# Patient Record
Sex: Female | Born: 1935 | Race: White | Hispanic: No | Smoking: Never smoker
Health system: Southern US, Community
[De-identification: ages and names within clinical notes are randomized; demographics above are authoritative.]

## PROBLEM LIST (undated history)

## (undated) DIAGNOSIS — H3530 Unspecified macular degeneration: Secondary | ICD-10-CM

## (undated) DIAGNOSIS — T7840XA Allergy, unspecified, initial encounter: Secondary | ICD-10-CM

## (undated) DIAGNOSIS — E7800 Pure hypercholesterolemia, unspecified: Secondary | ICD-10-CM

## (undated) DIAGNOSIS — Z8619 Personal history of other infectious and parasitic diseases: Secondary | ICD-10-CM

## (undated) DIAGNOSIS — M1990 Unspecified osteoarthritis, unspecified site: Secondary | ICD-10-CM

## (undated) DIAGNOSIS — J189 Pneumonia, unspecified organism: Secondary | ICD-10-CM

## (undated) DIAGNOSIS — I10 Essential (primary) hypertension: Secondary | ICD-10-CM

## (undated) HISTORY — DX: Unspecified macular degeneration: H35.30

## (undated) HISTORY — DX: Essential (primary) hypertension: I10

## (undated) HISTORY — DX: Allergy, unspecified, initial encounter: T78.40XA

## (undated) HISTORY — DX: Pure hypercholesterolemia, unspecified: E78.00

## (undated) HISTORY — DX: Personal history of other infectious and parasitic diseases: Z86.19

## (undated) HISTORY — DX: Unspecified osteoarthritis, unspecified site: M19.90

## (undated) HISTORY — DX: Pneumonia, unspecified organism: J18.9

---
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
## 1997-04-25 ENCOUNTER — Ambulatory Visit (HOSPITAL_COMMUNITY): Admission: RE | Admit: 1997-04-25 | Discharge: 1997-04-25 | Payer: Self-pay | Admitting: Cardiology

## 1997-07-22 ENCOUNTER — Other Ambulatory Visit: Admission: RE | Admit: 1997-07-22 | Discharge: 1997-07-22 | Payer: Self-pay | Admitting: Cardiology

## 1999-01-20 ENCOUNTER — Ambulatory Visit (HOSPITAL_COMMUNITY): Admission: RE | Admit: 1999-01-20 | Discharge: 1999-01-20 | Payer: Self-pay | Admitting: Cardiology

## 2001-03-21 ENCOUNTER — Ambulatory Visit (HOSPITAL_COMMUNITY): Admission: RE | Admit: 2001-03-21 | Discharge: 2001-03-21 | Payer: Self-pay | Admitting: Cardiology

## 2001-04-16 ENCOUNTER — Emergency Department (HOSPITAL_COMMUNITY): Admission: EM | Admit: 2001-04-16 | Discharge: 2001-04-16 | Payer: Self-pay | Admitting: Emergency Medicine

## 2002-05-17 ENCOUNTER — Ambulatory Visit (HOSPITAL_COMMUNITY): Admission: RE | Admit: 2002-05-17 | Discharge: 2002-05-17 | Payer: Self-pay | Admitting: *Deleted

## 2003-11-07 ENCOUNTER — Encounter: Admission: RE | Admit: 2003-11-07 | Discharge: 2003-11-07 | Payer: Self-pay | Admitting: Orthopedic Surgery

## 2007-02-23 DIAGNOSIS — J189 Pneumonia, unspecified organism: Secondary | ICD-10-CM

## 2007-02-23 HISTORY — DX: Pneumonia, unspecified organism: J18.9

## 2007-08-15 ENCOUNTER — Inpatient Hospital Stay (HOSPITAL_COMMUNITY): Admission: EM | Admit: 2007-08-15 | Discharge: 2007-08-21 | Payer: Self-pay | Admitting: Emergency Medicine

## 2008-06-13 ENCOUNTER — Encounter: Payer: Self-pay | Admitting: Family Medicine

## 2008-10-17 ENCOUNTER — Encounter: Payer: Self-pay | Admitting: Family Medicine

## 2008-10-23 ENCOUNTER — Encounter: Payer: Self-pay | Admitting: Family Medicine

## 2008-10-24 ENCOUNTER — Ambulatory Visit: Payer: Self-pay | Admitting: Family Medicine

## 2008-10-24 DIAGNOSIS — I10 Essential (primary) hypertension: Secondary | ICD-10-CM

## 2008-10-24 DIAGNOSIS — J309 Allergic rhinitis, unspecified: Secondary | ICD-10-CM | POA: Insufficient documentation

## 2008-10-24 DIAGNOSIS — M1710 Unilateral primary osteoarthritis, unspecified knee: Secondary | ICD-10-CM

## 2008-10-24 DIAGNOSIS — E785 Hyperlipidemia, unspecified: Secondary | ICD-10-CM

## 2009-06-30 ENCOUNTER — Encounter: Payer: Self-pay | Admitting: Family Medicine

## 2009-07-02 ENCOUNTER — Encounter: Payer: Self-pay | Admitting: Family Medicine

## 2009-11-11 ENCOUNTER — Encounter (INDEPENDENT_AMBULATORY_CARE_PROVIDER_SITE_OTHER): Payer: Self-pay | Admitting: *Deleted

## 2009-11-19 ENCOUNTER — Ambulatory Visit: Payer: Self-pay | Admitting: Cardiology

## 2010-01-19 ENCOUNTER — Ambulatory Visit: Payer: Self-pay | Admitting: Family Medicine

## 2010-02-25 ENCOUNTER — Ambulatory Visit
Admission: RE | Admit: 2010-02-25 | Discharge: 2010-02-25 | Payer: Self-pay | Source: Home / Self Care | Attending: Family Medicine | Admitting: Family Medicine

## 2010-03-16 ENCOUNTER — Other Ambulatory Visit: Payer: Self-pay | Admitting: Family Medicine

## 2010-03-16 ENCOUNTER — Ambulatory Visit
Admission: RE | Admit: 2010-03-16 | Discharge: 2010-03-16 | Payer: Self-pay | Source: Home / Self Care | Attending: Family Medicine | Admitting: Family Medicine

## 2010-03-18 ENCOUNTER — Encounter (INDEPENDENT_AMBULATORY_CARE_PROVIDER_SITE_OTHER): Payer: Self-pay | Admitting: *Deleted

## 2010-03-18 ENCOUNTER — Encounter: Payer: Self-pay | Admitting: Family Medicine

## 2010-03-26 NOTE — Letter (Signed)
Summary: Results Follow up Letter  [place] at [facility]  [address] [place], [place] [address]   Phone: [phone]  Fax: [phone]    03/18/2010 MRN: [id number]  Cindy Harvey [address] [place], [place]  [address]  Dear Ms. Cindy Harvey,  The following are the results of your recent test(s):  Test         Result    Pap Smear:        Normal _____  Not Normal _____ Comments: ______________________________________________________ Cholesterol: LDL(Bad cholesterol):         Your goal is less than:         HDL (Good cholesterol):       Your goal is more than: Comments:  ______________________________________________________ Mammogram:        Normal _____  Not Normal _____ Comments:  ___________________________________________________________________ Hemoccult:        Normal __X___  Not normal _______ Comments:  Repeat in 1 year  _____________________________________________________________________ Other Tests:    We routinely do not discuss normal results over the telephone.  If you desire a copy of the results, or you have any questions about this information we can discuss them at your next office visit.   Sincerely,       Sharilyn Sites for Dr. Roxy Manns

## 2010-03-26 NOTE — Miscellaneous (Signed)
Summary: iFOB to flowsheet  Clinical Lists Changes  Observations: Added new observation of HEMOCULTDUE: 03/2011 (03/18/2010 11:44) Added new observation of HEMOCCULT: negative (03/16/2010 11:44)      Preventive Care Screening  Hemoccult:    Date:  03/16/2010    Next Due:  03/2011    Results:  negative

## 2010-03-26 NOTE — Letter (Signed)
Summary: Results Follow up Letter  [place] at [facility]  [address] [place], [place] [address]   Phone: [phone]  Fax: [phone]    07/02/2009 MRN: [id number]    IDA Cayer [address] [place], [place]  [address]    Dear Ms. Janae Bridgeman,  The following are the results of your recent test(s):  Test         Result    Pap Smear:        Normal _____  Not Normal _____ Comments: ______________________________________________________ Cholesterol: LDL(Bad cholesterol):         Your goal is less than:         HDL (Good cholesterol):       Your goal is more than: Comments:  ______________________________________________________ Mammogram:        Normal _X____  Not Normal _____ Comments:Please repeat mammogram in one year.  ___________________________________________________________________ Hemoccult:        Normal _____  Not normal _______ Comments:    _____________________________________________________________________ Other Tests:    We routinely do not discuss normal results over the telephone.  If you desire a copy of the results, or you have any questions about this information we can discuss them at your next office visit.   Sincerely,    Idamae Schuller Ethan Clayburn,MD  MT/ri

## 2010-03-26 NOTE — Miscellaneous (Signed)
Summary: flu vaccine at [facility]  Clinical Lists Changes  Observations: Added new observation of FLU VAX: Historical (11/10/2009 14:41)      Immunization History:  Influenza Immunization History:    Influenza:  historical (11/10/2009)  Received flu vaccine at [facility] [address], [place].             Lowella Petties CMA  November 11, 2009 2:42 PM

## 2010-03-26 NOTE — Assessment & Plan Note (Signed)
Summary: check up - chronic care   Vital Signs:  Patient profile:   77 year old female Height:      60 inches Weight:      137 pounds BMI:     26.85 Temp:     97.6 degrees F oral Pulse rate:   76 / minute Pulse rhythm:   regular BP sitting:   126 / 64  (left arm) Cuff size:   regular  Vitals Entered By: Lewanda Rife LPN (January 19, 2010 2:35 PM) CC: check up chronic med problems  Vision Screening:      Vision Comments: Pt just saw eye dr. Rock Nephew could not see chart.Lewanda Rife LPN  January 19, 2010 2:37 PM   Vision Entered By: Lewanda Rife LPN (January 19, 2010 2:37 PM)  Hearing Screen 25db HL: Left  Right  Audiometry Comment: Pt has hearing aids and gets hearing cked with audiologist.Rena [facility] LPN  January 19, 2010 2:37 PM     History of Present Illness: here for check up of chronic med problems and to review health mt list   HTN-- bp in good control with 126/64 today  bmi is 26 with stable wt   has been feeling ok in general  her family thinks she was a bit depressed  had med in the past short term  is a Product/process development scientist (has 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 that she worries about ) sometimes she cries  does wake up every night and then stays up late  she claims it is not bad enough to treat  does not like to do a whole lot   stays fairly active   per family -- does not eat a healthy diet  a little ensure and vitamins  eats a lot of "junk" -- cookies  chooses not to eat meals   gets regular lipid checks from cardiologist  she goes every 4 months to Dr Sampson Si 5/11 self exam - no lumps or changes (though does not do breast exams regularly) Dr Patty Sermons checks her breast exam  does not want a breast exam today- she declines   gyn -- last pap and exam years ago  no problems or symptoms   Td2010 flu shot in sept pneumonia vax 09  zoster status- has had the shingles vaccine  unsure when she had the vaccine   colon cancer screen-- does not want to do colonoscopy but open to  doing a stool card     OP screen-- has never had a dexa  ca and D  macular degeneration- sees opthy regularly she gets down about her dwindling sight at times  is not severe - but can be frustrating   has hearing aides   Allergies (verified): No Known Drug Allergies  Past History:  Past Medical History: Last updated: 10/24/2008 osteo arthritis (R knee) Allergic rhinitis Hyperlipidemia (no heart problems )  Hypertension Urinary Incontinence macular degeneration hearing loss  thinks she has had shingles - years ago      cardiol Dr Patty Sermons ortho-Dr Giofree pod- Dr Lorelee New Dr Felicity Pellegrini  Past Surgical History: Last updated: 10/24/2008 Tonsillectomy (1928) Childbirth (1946) Pnemonia, dehydration, electrolyre imbalance (2009)  Family History: Last updated: 10/24/2008 Family History of  Stroke( Mother, Sister) Family History of Heart Disease (Mother) Family History Breast cancer (Maternal Aunt, 2 Maternal 1st cousins) mother - heart disease  sister CVa in 90s  sister died of pneumonia /? heart  sister CVA - in 90s  sister alive in 38s  Social History: Last updated: 10/24/2008 Retired widowed  lives in [place] with son  family close by son is lawyer/ daughter nurse  walks regularly (lives next door to [employer])  Never Smoked Alcohol use-no Drug use-no  Risk Factors: Smoking Status: never (10/24/2008)  Review of Systems General:  Complains of sleep disorder; denies fatigue and malaise. Eyes:  Denies eye irritation. CV:  Denies chest pain or discomfort and palpitations. Resp:  Denies cough, shortness of breath, and wheezing. GI:  Denies abdominal pain, change in bowel habits, indigestion, and nausea. GU:  Denies urinary frequency. MS:  Complains of joint pain and stiffness; denies joint redness, joint swelling, muscle aches, cramps, and muscle weakness. Derm:  Denies itching, lesion(s), poor wound healing, and rash. Neuro:  Denies  numbness and tingling. Psych:  Complains of anxiety and depression; denies panic attacks, sense of great danger, and suicidal thoughts/plans. Endo:  Denies cold intolerance, excessive thirst, excessive urination, and heat intolerance. Heme:  Denies abnormal bruising and bleeding.  Physical Exam  General:  well appearing elderly female in no distress Head:  normocephalic, atraumatic, and no abnormalities observed.   Eyes:  vision grossly intact, pupils equal, pupils round, and pupils reactive to light.  no conjunctival pallor, injection or icterus  Ears:  R ear normal and L ear normal.   Nose:  no nasal discharge.   Mouth:  pharynx pink and moist.   Neck:  supple with full rom and no masses or thyromegally, no JVD or carotid bruit  Chest Wall:  No deformities, masses, or tenderness noted. Lungs:  CTA - bs are harsh at bases without wheeze  Heart:  RRR soft M vs split S1  Abdomen:  Bowel sounds positive,abdomen soft and non-tender without masses, organomegaly or hernias noted. no renal bruits  Msk:  No deformity or scoliosis noted of thoracic or lumbar spine.  some changes of OA  Pulses:  R and L carotid,radial,femoral,dorsalis pedis and posterior tibial pulses are full and equal bilaterally Extremities:  No clubbing, cyanosis, edema, or deformity noted with normal full range of motion of all joints.   Neurologic:  sensation intact to light touch, gait normal, and DTRs symmetrical and normal.   Skin:  Intact without suspicious lesions or rashes Cervical Nodes:  No lymphadenopathy noted Inguinal Nodes:  No significant adenopathy Psych:  normal affect, talkative and pleasant  does occ repeat herself and very tangential difficult to guide in conversation poor historian--family member was helpful   Impression & Recommendations:  Problem # 1:  HYPERTENSION (ICD-401.9) Assessment Unchanged  overall good control  meds per her cardiologist --- will ask for labs at next draw there no  change  Her updated medication list for this problem includes:    Lisinopril 20 Mg Tabs (Lisinopril) .Marland Kitchen... Take 1 tablet by mouth once a day    Amlodipine Besylate 5 Mg Tabs (Amlodipine besylate) .Marland Kitchen... Take 1 tablet by mouth once a day  BP today: 126/64 Prior BP: 110/68 (10/24/2008)  Problem # 2:  HYPERLIPIDEMIA (ICD-272.4) in good control with cardiol monitor  no side eff will ask for labs at next draw there  disc low sat fat diet and pt is not motivated to work on that  explained in detail why this is important  Her updated medication list for this problem includes:    Zetia 10 Mg Tabs (Ezetimibe) .Marland Kitchen... Take 1 tablet by mouth once a day    Niaspan 500 Mg Cr-tabs (Niacin (antihyperlipidemic)) .Marland Kitchen... 2 tabs by mouth at bedtime  Problem # 3:  FAMILY HISTORY BREAST CANCER 1ST DEGREE RELATIVE <50 (ICD-V16.3) pt declines breast exam  enc to do her own  wants to continue mam- due in [place]  Problem # 4:  Preventive Health Care (ICD-V70.0) Assessment: Comment Only pt is interested in dexa this year- will call back to schedule disc imp of bone health and prev of fractures  rev ca and vit D req also will do stool immunoassay card  Complete Medication List: 1)  Zetia 10 Mg Tabs (Ezetimibe) .... Take 1 tablet by mouth once a day 2)  Lisinopril 20 Mg Tabs (Lisinopril) .... Take 1 tablet by mouth once a day 3)  Amlodipine Besylate 5 Mg Tabs (Amlodipine besylate) .... Take 1 tablet by mouth once a day 4)  Niaspan 500 Mg Cr-tabs (Niacin (antihyperlipidemic)) .... 2 tabs by mouth at bedtime 5)  Ocuvite Tabs (Multiple vitamins-minerals) .... Take 1 tablet by mouth once a day 6)  [place]  .Marland Kitchen.. 1 tbsp by mouth daily 7)  Calcium-vitamin D 500-125 Mg-unit Tabs (Calcium-vitamin d) .... Two rabs by mouth daily 8)  Childrens Multivitamin + Mineral Supplement _ Dha  .Marland Kitchen.. 3 tabs by mouth daily 9)  Benadryl Allergy Childrens 12.5 Mg/77ml Liqd (Diphenhydramine hcl) .... By mouth as needed 10)   Tylenol Extra Strength 500 Mg Tabs (Acetaminophen) .... By mouth as needed 11)  Mucinex For Kids 100 Mg Pack (Guaifenesin) .... By mouth as needed 12)  Fexofenadine Hcl 60 Mg Tabs (Fexofenadine hcl) .... By mouth as needed  Patient Instructions: 1)  try to eat a healthier diet -- get at least 5 fruits and vegetables per day  2)  don't forget to do your own breast exams  3)  the current recommendation for calcium intake is 1200-1500 mg daily with -1000 IU of vitamin D  4)  I recommend a bone density test -- to screen for osteoporosis  5)  call us when you are ready to schedule that 6)  please do stool card for colon cancer screening  7)  let me know in the future the date of your shingles vaccine  8)  when you get your next labs from Dr Patty Sermons - I am interested in renal profile/ hepatic profile/ lipids/ cbc with diff/ tsh / lipids  9)  if your depression worsens- please come in to talk to me about it    Orders Added: 1)  Est. Patient Level IV [60454]     Current Allergies (reviewed today): No known allergies

## 2010-03-26 NOTE — Letter (Signed)
Summary: [facility]: Immunoassay Fecal Occult Blood (iFOB) Order Form  [place] at [facility]  [address] [place], [place] [address]   Phone: [phone]  Fax: [phone]      [facility]: Immunoassay Fecal Occult Blood (iFOB) Order Form   January 19, 2010 MRN: [id number]   Cindy Harvey 22-Jun-1916   Physicican Name:____Tower_____________________  Diagnosis Code:________V76.3 colon cancer screen__________________      Judith Part MD

## 2010-03-26 NOTE — Miscellaneous (Signed)
Summary: mammo screening  Clinical Lists Changes  Observations: Added new observation of MAMMO DUE: 06/2010 (07/02/2009 12:22) Added new observation of MAMMOGRAM: normal (06/30/2009 12:22)      Preventive Care Screening  Mammogram:    Date:  06/30/2009    Next Due:  06/2010    Results:  normal

## 2010-03-28 ENCOUNTER — Encounter: Payer: Self-pay | Admitting: Cardiology

## 2010-03-28 DIAGNOSIS — E7800 Pure hypercholesterolemia, unspecified: Secondary | ICD-10-CM | POA: Insufficient documentation

## 2010-03-28 DIAGNOSIS — I10 Essential (primary) hypertension: Secondary | ICD-10-CM | POA: Insufficient documentation

## 2010-05-20 ENCOUNTER — Telehealth: Payer: Self-pay | Admitting: Cardiology

## 2010-05-20 NOTE — Telephone Encounter (Signed)
Patient requesting samples 

## 2010-05-21 ENCOUNTER — Encounter: Payer: Self-pay | Admitting: Cardiology

## 2010-05-21 ENCOUNTER — Ambulatory Visit (INDEPENDENT_AMBULATORY_CARE_PROVIDER_SITE_OTHER): Payer: Medicare Other | Admitting: Cardiology

## 2010-05-21 VITALS — BP 136/78 | HR 80 | Wt 132.0 lb

## 2010-05-21 DIAGNOSIS — F32A Depression, unspecified: Secondary | ICD-10-CM | POA: Insufficient documentation

## 2010-05-21 DIAGNOSIS — I119 Hypertensive heart disease without heart failure: Secondary | ICD-10-CM

## 2010-05-21 DIAGNOSIS — F329 Major depressive disorder, single episode, unspecified: Secondary | ICD-10-CM

## 2010-05-21 DIAGNOSIS — E785 Hyperlipidemia, unspecified: Secondary | ICD-10-CM

## 2010-05-21 DIAGNOSIS — I10 Essential (primary) hypertension: Secondary | ICD-10-CM

## 2010-05-21 DIAGNOSIS — Z79899 Other long term (current) drug therapy: Secondary | ICD-10-CM

## 2010-05-21 LAB — COMPREHENSIVE METABOLIC PANEL
AST: 23 U/L (ref 0–37)
Albumin: 4.3 g/dL (ref 3.5–5.2)
Alkaline Phosphatase: 64 U/L (ref 39–117)
Glucose, Bld: 88 mg/dL (ref 70–99)
Potassium: 4.2 mEq/L (ref 3.5–5.1)
Sodium: 137 mEq/L (ref 135–145)
Total Bilirubin: 0.3 mg/dL (ref 0.3–1.2)
Total Protein: 6.9 g/dL (ref 6.0–8.3)

## 2010-05-21 LAB — LDL CHOLESTEROL, DIRECT: Direct LDL: 111.7 mg/dL

## 2010-05-21 LAB — CBC WITH DIFFERENTIAL/PLATELET
Basophils Relative: 0.4 % (ref 0.0–3.0)
Eosinophils Absolute: 0.1 10*3/uL (ref 0.0–0.7)
Eosinophils Relative: 1.6 % (ref 0.0–5.0)
HCT: 35.3 % — ABNORMAL LOW (ref 36.0–46.0)
Lymphs Abs: 2.3 10*3/uL (ref 0.7–4.0)
MCHC: 33.1 g/dL (ref 30.0–36.0)
MCV: 94.2 fl (ref 78.0–100.0)
Monocytes Absolute: 0.7 10*3/uL (ref 0.1–1.0)
Neutrophils Relative %: 59.8 % (ref 43.0–77.0)
Platelets: 288 10*3/uL (ref 150.0–400.0)
RBC: 3.75 Mil/uL — ABNORMAL LOW (ref 3.87–5.11)
WBC: 7.9 10*3/uL (ref 4.5–10.5)

## 2010-05-21 LAB — LIPID PANEL
HDL: 78.3 mg/dL (ref >39.00)
Triglycerides: 105 mg/dL (ref 0.0–149.0)

## 2010-05-21 NOTE — Assessment & Plan Note (Signed)
The patient is to stay on her present dose of Zetia.  She does not tolerate statins which caused her to have muscle aches.  We are checking a fasting lipid panel today.

## 2010-05-21 NOTE — Assessment & Plan Note (Signed)
Her blood pressure remained stable on current regimen.  Continue lisinopril 20 mg daily and amlodipine 5 mg daily.  She is not having any cough from lisinopril.

## 2010-05-21 NOTE — Progress Notes (Signed)
HPI: This pleasant 77 year old woman is seen for a six-month followup office visit.  She has a history of essential hypertension and a history of high cholesterol.  She does not give any history ofExertional chest pain or angina pectoris.  She has had some problems with anxiety and depression.  However she has refused any treatment for her depression because she is concerned that it might make her too drowsy at night when she gets up to go to the bathroom.  Current Outpatient Prescriptions  Medication Sig Dispense Refill  . ALPRAZolam (XANAX) 0.25 MG tablet Take 0.25 mg by mouth 2 (two) times daily as needed.        Marland Kitchen amLODipine (NORVASC) 5 MG tablet Take 5 mg by mouth daily.        . Calcium Carbonate-Vitamin D (CALCIUM 600+D) 600-200 MG-UNIT TABS Take by mouth daily.        Marland Kitchen ezetimibe (ZETIA) 10 MG tablet Take 10 mg by mouth daily.        Marland Kitchen FEXOFENADINE HCL PO Take by mouth daily as needed.        Marland Kitchen guaiFENesin (MUCINEX) 600 MG 12 hr tablet Take 600 mg by mouth as needed.        Marland Kitchen lisinopril (PRINIVIL,ZESTRIL) 20 MG tablet Take 20 mg by mouth daily.        . Multiple Vitamins-Minerals (OCUVITE PO) Take by mouth daily.        . multivitamin (THERAGRAN) per tablet Take 1 tablet by mouth daily.        . niacin (NIASPAN) 500 MG CR tablet Take 500 mg by mouth at bedtime. 2 at bedtime        . Nutritional Supplements (ENSURE PO) Take by mouth at bedtime.        . promethazine (PHENERGAN) 25 MG tablet Take 25 mg by mouth as needed. For nausea        . Pseudoephedrine HCl (SUDAFED PO) Take by mouth daily as needed.          Allergies  Allergen Reactions  . Asa Buff (Mag (Aspirin Buffered)     Nose bleeds  . Lipitor (Atorvastatin Calcium)     Headaches & dizzy  . Zocor (Simvastatin - High Dose)     myalgia    Patient Active Problem List  Diagnoses  . HYPERLIPIDEMIA  . HYPERTENSION  . ALLERGIC RHINITIS  . OSTEOARTHRITIS, KNEE  . Depression (emotion)    History  Smoking status  .  Never Smoker   Smokeless tobacco  . Not on file    History  Alcohol Use     History reviewed. No pertinent family history.  Review of Systems: The patient denies any heat or cold intolerance.  No weight gain or weight loss.  The patient denies headaches or blurry vision.  There is no cough or sputum production.  The patient denies dizziness.  There is no hematuria or hematochezia.  The patient denies any muscle aches or arthritis.  The patient denies any rash.  The patient denies frequent falling or instability.  .  All other systems were reviewed and are negative.   Physical Exam: Filed Vitals:   05/21/10 0904  BP: 136/78  Pulse: 80  Is a well-developed well-nourished woman in no distress.  The skin is clear.Pupils equal and reactive.   Extraocular Movements are full.  There is no scleral icterus.  The mouth and pharynx are normal.  The neck is supple.  The carotids reveal no bruits.  The jugular venous pressure is normal.  The thyroid is not enlarged.  There is no lymphadenopathy.The chest is clear to percussion and auscultation. There are no rales or rhonchi. Expansion of the chest is symmetrical.The precordium is quiet.  The first heart sound is normal.  The second heart sound is physiologically split.  There is no murmur gallop rub or click.  There is no abnormal lift or heave.The abdomen is soft and nontender. Bowel sounds are normal. The liver and spleen are not enlarged. There Are no abdominal masses. There are no bruits. The breasts reveal no masses.  Normal extremity without phlebitis or edema.  Pedal pulses are excellent.Strength is normal and symmetrical in all extremities.  There is no lateralizing weakness.  There are no sensory deficits.    Assessment / Plan:

## 2010-05-21 NOTE — Assessment & Plan Note (Signed)
She has refused any antidepressant therapy at this point.  Her daughter has discussed this with the primary care physician.

## 2010-05-25 ENCOUNTER — Other Ambulatory Visit: Payer: Self-pay | Admitting: Cardiology

## 2010-05-25 DIAGNOSIS — E7800 Pure hypercholesterolemia, unspecified: Secondary | ICD-10-CM

## 2010-05-27 ENCOUNTER — Telehealth: Payer: Self-pay | Admitting: *Deleted

## 2010-05-27 NOTE — Telephone Encounter (Signed)
Message copied by Regis Bill on Wed May 27, 2010 11:42 AM ------      Message from: Cassell Clement      Created: Fri May 22, 2010  1:20 PM       Please report.The BUN and creatinine are slightly high so she needs to drink more water.The cholesterol is 218.The CBC shows a mildly low hemoglobin of 11.7.  We should repeat her CBC at the next office visit.Thyroid function is normal.Continue same medication.

## 2010-05-27 NOTE — Telephone Encounter (Signed)
Advised patient of labs 

## 2010-06-14 ENCOUNTER — Emergency Department (HOSPITAL_COMMUNITY): Payer: Medicare Other

## 2010-06-14 ENCOUNTER — Emergency Department (HOSPITAL_COMMUNITY)
Admission: EM | Admit: 2010-06-14 | Discharge: 2010-06-14 | Disposition: A | Discharge status: Home / Self Care | Payer: Medicare Other | Attending: Emergency Medicine | Admitting: Emergency Medicine

## 2010-06-14 DIAGNOSIS — E7800 Pure hypercholesterolemia, unspecified: Secondary | ICD-10-CM | POA: Insufficient documentation

## 2010-06-14 DIAGNOSIS — I10 Essential (primary) hypertension: Secondary | ICD-10-CM | POA: Insufficient documentation

## 2010-06-14 DIAGNOSIS — R51 Headache: Secondary | ICD-10-CM | POA: Insufficient documentation

## 2010-06-14 DIAGNOSIS — W010XXA Fall on same level from slipping, tripping and stumbling without subsequent striking against object, initial encounter: Secondary | ICD-10-CM | POA: Insufficient documentation

## 2010-06-14 DIAGNOSIS — M129 Arthropathy, unspecified: Secondary | ICD-10-CM | POA: Insufficient documentation

## 2010-06-14 DIAGNOSIS — M25569 Pain in unspecified knee: Secondary | ICD-10-CM | POA: Insufficient documentation

## 2010-06-14 DIAGNOSIS — Z79899 Other long term (current) drug therapy: Secondary | ICD-10-CM | POA: Insufficient documentation

## 2010-06-14 DIAGNOSIS — S99919A Unspecified injury of unspecified ankle, initial encounter: Secondary | ICD-10-CM | POA: Insufficient documentation

## 2010-06-14 DIAGNOSIS — Y9229 Other specified public building as the place of occurrence of the external cause: Secondary | ICD-10-CM | POA: Insufficient documentation

## 2010-06-14 DIAGNOSIS — IMO0002 Reserved for concepts with insufficient information to code with codable children: Secondary | ICD-10-CM | POA: Insufficient documentation

## 2010-06-14 DIAGNOSIS — S8990XA Unspecified injury of unspecified lower leg, initial encounter: Secondary | ICD-10-CM | POA: Insufficient documentation

## 2010-06-14 DIAGNOSIS — M79609 Pain in unspecified limb: Secondary | ICD-10-CM | POA: Insufficient documentation

## 2010-06-14 DIAGNOSIS — M25529 Pain in unspecified elbow: Secondary | ICD-10-CM | POA: Insufficient documentation

## 2010-06-23 ENCOUNTER — Encounter: Payer: Self-pay | Admitting: Cardiology

## 2010-06-29 ENCOUNTER — Encounter: Payer: Self-pay | Admitting: Cardiology

## 2010-07-02 ENCOUNTER — Encounter: Payer: Self-pay | Admitting: Cardiology

## 2010-07-06 ENCOUNTER — Encounter: Payer: Self-pay | Admitting: Family Medicine

## 2010-07-07 ENCOUNTER — Encounter: Payer: Self-pay | Admitting: Family Medicine

## 2010-07-07 ENCOUNTER — Ambulatory Visit (INDEPENDENT_AMBULATORY_CARE_PROVIDER_SITE_OTHER): Payer: Medicare Other | Admitting: Family Medicine

## 2010-07-07 VITALS — BP 126/58 | HR 80 | Temp 97.7°F | Ht 60.0 in | Wt 133.8 lb

## 2010-07-07 DIAGNOSIS — H00019 Hordeolum externum unspecified eye, unspecified eyelid: Secondary | ICD-10-CM

## 2010-07-07 MED ORDER — ERYTHROMYCIN 5 MG/GM OP OINT: TOPICAL_OINTMENT | OPHTHALMIC | Status: AC

## 2010-07-07 NOTE — Patient Instructions (Signed)
Keep eyelid clean- baby shampoo is helpful  Use warm compress 10 minutes at a time as often as you can Use the erythromycin ointment on eyelid three times daily  Update me if worse or not improving in 4-5 days or if any acute vision change

## 2010-07-07 NOTE — H&P (Signed)
Cindy Harvey, Cindy Harvey                     ACCOUNT NO.:  [id number]   MEDICAL RECORD NO.:  [id number]          PATIENT TYPE:  INP   LOCATION:  2114                         FACILITY:  [facility]   PHYSICIAN:  Thomasenia Bottoms, MDDATE OF BIRTH:  1917/02/22   DATE OF ADMISSION:  08/14/2007  DATE OF DISCHARGE:                              HISTORY & PHYSICAL   CHIEF COMPLAINT:  Vomiting and diarrhea.   HISTORY OF PRESENT ILLNESS:  Cindy Harvey is an 77 year old woman brought in  by her daughter because the patient has continued to vomit and have  diarrhea for 4 days now.  Last night, she had projectile vomiting after  taking her antibiotics.  The patient's daughter provides all the  history.  She states essentially the patient had some seasonal allergy-  type symptoms, but it developed into a chest cold with productive cough  and low-grade fever.  She was actually seen in urgent care approximately  2 days ago and was diagnosed with pneumonia.  She was prescribed Avelox  and Phenergan, but the patient has been vomiting both of those  medications up essentially since she got them last night.  A couple of  days ago after taking the Phenergan, she got extremely confused and she  has actually even had a couple of episodes of projectile vomiting  yesterday, though she was able to eat today.   PAST MEDICAL HISTORY:  Significant for cataract surgery, hypertension,  and hypercholesterolemia.  She has not been hospitalized in decades.   MEDICATIONS ON ARRIVAL:  Lisinopril and hydrochlorothiazide combination,  Niaspan, Zetia, and calcium.  She is also on an Ocuvite.   SOCIAL HISTORY:  She does not smoke cigarettes, drink alcohol, or use  any illicit drugs.  She is independent and functional.  She drives.  She  is very active.  The daughter has been staying with her since she has  been sick.   REVIEW OF SYSTEMS:  The patient is not able to provide, it comes from  the history.  The patient is extremely hard of  hearing, so the daughter  provides all of the history.  The patient is able to tell me that she  has no headache, no chest pain, no shortness of breath, she has no pain  at all, and essentially feels okay, except for the fact that her mouth  is very dry and she is thirsty.   PHYSICAL EXAMINATION:  VITAL SIGNS:  In the emergency department her  temperature was not recorded because the patient was drinking, but  initial blood pressure was 188/75, pulse 74, respiratory rate 16, and O2  sats 97% on room air.  GENERAL:  She is an elderly woman in no acute distress.  HEENT:  Normocephalic and atraumatic.  Pupils are surgically irregular.  Her sclerae nonicteric.  Oral mucosa moist.  She wears dentures.  NECK:  Supple.  No lymphadenopathy.  No thyromegaly.  No jugular venous  distention.  CARDIAC:  Regular rate and rhythm.  She did have a systolic murmur.  LUNGS:  Reveal some very minimal expiratory wheezing.  ABDOMEN:  Soft and nontender, slightly distended.  No  hepatosplenomegaly.  She does have bowel sounds.  EXTREMITIES:  No evidence of clubbing, cyanosis, or edema.  NEUROLOGIC:  She is alert.  She is cooperative and appropriate.  She is  hard of hearing, but she does follow commands.  She has no asymmetry of  her extremities.  Sensory exam grossly is intact.  SKIN:  Intact with no open lesions or rashes.  MUSCULOSKELETAL:  Reveals some bony prominences, but no evidence of  effusions of her joints.   LABORATORY DATA:  The patient's EKG reveals normal sinus rhythm with a  rate of 79.  She has LVH and some nonspecific T-wave flattening, but no  ST-segment elevation or depression.  I did review her EKG.  OTHER DATA:  Her white count is 14.7, hemoglobin 12.1, hematocrit 34.5,  and platelet count is 417.  Sodium is 106, potassium 3.1, chloride 71,  BUN 10, and creatinine 1.2.  Her chest x-ray reveals mild cardiomegaly,  mild interstitial lung disease, which is likely chronic, and no  discrete  pneumonia seen.   ASSESSMENT AND PLAN:  1. Severe hyponatremia.  The patient does not have any neurologic      sequelae.  She is alert and appropriate.  However, because of the      severeness of the hyponatremia, we will put her in the step-down      unit and follow her sodiums extremely carefully.  She is volume      depleted and also has been taking her hydrochlorothiazide, so we      will treat this with IV fluids.  We will put her on some normal      saline and follow carefully.  Certainly, we will hold her      hydrochlorothiazide and all of her medications for now.  2. Hypokalemia.  This is likely due to the vomiting and diarrhea.  We      will replace this.  3. Pneumonia.  I will actually continue the patient on antibiotics      even though our chest x-ray tonight is negative.  She was on Avelox      as an outpatient, I am going to put her on Rocephin and Zithromax.  4. Hypertension.  The patient's blood pressure is high now in the      emergency department, so we Tetrick have to restart her lisinopril, but      not yet the hydrochlorothiazide.  5. Hypercholesterolemia.  We are going to hold her medications for      now.  I did discuss the seriousness of the patient's condition with      her daughter.      Thomasenia Bottoms, MD  Electronically Signed     CVC/MEDQ  D:  08/15/2007  T:  08/15/2007  Job:  [id number]   cc:   Cassell Clement, M.D.

## 2010-07-07 NOTE — Assessment & Plan Note (Signed)
Of R eyelid - mild to moderate  Some evidence of drainage with crust  Soft to touch- mildly tender without conjunctivitis Will tx with hygiene and warm compresses and erythromycin oint Will keep me updated

## 2010-07-07 NOTE — Progress Notes (Signed)
  Subjective:    Patient ID: Cindy Harvey, female    DOB: 1916/11/13, 77 y.o.   MRN: [id number]  HPI R red eyelid started last Wednesday Progressively worse bump  A little better than yesterday   Used a warm compress yesterday No fever No change in vision  A little sore to the touch , but no constant pain  ? If drained a little yesterday No headache  Past Medical History  Diagnosis Date  . Hypertension   . Hypercholesterolemia   . Osteoarthritis     right knee  . Allergy     allergic rhinitis  . Urinary incontinence   . Macular degeneration   . Hearing loss   . History of shingles   . Pneumonia 2009    with dehydration and electrolyte imbalance    History   Social History  . Marital Status: Widowed    Spouse Name: N/A    Number of Children: N/A  . Years of Education: N/A   Occupational History  . Not on file.   Social History Main Topics  . Smoking status: Never Smoker   . Smokeless tobacco: Not on file  . Alcohol Use: No  . Drug Use: No  . Sexually Active:    Other Topics Concern  . Not on file   Social History Narrative  . No narrative on file        Review of Systems ROS  Review of Systems  Constitutional: Negative for fever, appetite change, fatigue and unexpected weight change.  Eyes: Negative for pain and visual disturbance.  Respiratory: Negative for cough and shortness of breath.   Cardiovascular: Negative.   Gastrointestinal: Negative for nausea, diarrhea and constipation.  Genitourinary: Negative for urgency and frequency.  Skin: Negative for pallor. or rash Neurological: Negative for weakness, light-headedness, numbness and headaches.  Hematological: Negative for adenopathy. Does not bruise/bleed easily.  Psychiatric/Behavioral: Negative for dysphoric mood. The patient is not nervous/anxious.      .     Objective:   Physical Exam  Constitutional: She appears well-developed and well-nourished. No distress.  HENT:  Head:  Normocephalic and atraumatic.  Right Ear: External ear normal.  Left Ear: External ear normal.  Nose: Nose normal.  Eyes: Conjunctivae and EOM are normal. Pupils are equal, round, and reactive to light. Right eye exhibits no discharge. Left eye exhibits no discharge.       R eye - hordeolum at middle of top lash line  3-4 mm , slt fluctuant, erythematous  Swelling does nor extend to whole lid or eye  grosly nl vision eoms intact   Neck: Normal range of motion. Neck supple. No JVD present.  Cardiovascular: Normal rate, regular rhythm and normal heart sounds.   Pulmonary/Chest: Effort normal and breath sounds normal. No respiratory distress. She has no wheezes.  Musculoskeletal: She exhibits no edema.  Lymphadenopathy:    She has no cervical adenopathy.  Neurological: She is alert. She has normal reflexes. No cranial nerve deficit.  Skin: Skin is warm and dry. No pallor.  Psychiatric: She has a normal mood and affect.          Assessment & Plan:

## 2010-07-10 NOTE — Discharge Summary (Signed)
Cindy Harvey, Cindy Harvey                     ACCOUNT NO.:  [id number]   MEDICAL RECORD NO.:  [id number]          PATIENT TYPE:  INP   LOCATION:  5125                         FACILITY:  [facility]   PHYSICIAN:  Michelene Gardener, MD    DATE OF BIRTH:  [birth year]   DATE OF ADMISSION:  08/14/2007  DATE OF DISCHARGE:  08/21/2007                               DISCHARGE SUMMARY   DISCHARGE DIAGNOSES:  1. Hyponatremia secondary to diuretics.  2. Acute bronchitis.  3. Hypertension.  4. Hyperlipidemia.  5. Altered mental status that resolved.   DISCHARGE MEDICATIONS:  New medications:  1. Lisinopril 20 mg p.o. once daily.  2. Norvasc 5 mg once a day.   Medications to be discontinued include:  Hydrochlorothiazide.   Rest of medications include:  1. Zetia 10 mg p.o. once daily.  2. Niaspan 500 mg 2 tablets at bedtime.  3. Calcium 2 tablets once a day.  4. Multivitamin 1 tablet p.o. once daily.   CONSULTATIONS:  None.   PROCEDURES:  None.   RADIOLOGY STUDIES:  1. Chest x-ray on August 15, 2007 showed mild cardiomegaly with mild      interstitial lung disease which is chronic.  2. Another x-ray on August 15, 2007 showed PICC line in place.   COURSE OF HOSPITALIZATION:  1. Hyponatremia.  Sodium at the time of admission was 110.  That was      thought to be mainly secondary to diuretics in addition to      dehydration.  Her hydrochlorothiazide was discontinued.  The      patient was started on IV fluids and her sodium levels have been      followed very frequently to avoid quick correction.  Sodium      continued to improve.  At the time of her discharge on August 19, 2007, her sodium was 130.  The patient was advised to avoid the      hydrochlorothiazide.  Also, other tests were done for hyponatremia      that include TSH and that came to be normal.  She had also cortisol      level that came to be normal.  2. Bronchitis.  The patient was treated with IV antibiotics and her      antibiotics were  discontinued at the time of discharge.  3. Hypertension.  As mentioned, her hydrochlorothiazide was      discontinued.  Blood pressure remained uncontrolled.  Adjustments      were made to her medications include increase her lisinopril to 20      mg and I also added Norvasc 5 mg once a day.  I urged her to follow      with her primary doctor for further adjustment of her blood      pressure medications.  4. Hyperlipidemia.  Same medications were continued.   DISPOSITION:  Otherwise, other medical conditions remained stable during  the hospitalization.   Total assessment time is 40 minutes.      Michelene Gardener, MD  Electronically Signed  NAE/MEDQ  D:  09/20/2007  T:  09/21/2007  Job:  [id number]

## 2010-07-14 ENCOUNTER — Telehealth: Payer: Self-pay | Admitting: *Deleted

## 2010-07-14 DIAGNOSIS — Z780 Asymptomatic menopausal state: Secondary | ICD-10-CM

## 2010-07-14 DIAGNOSIS — Z13820 Encounter for screening for osteoporosis: Secondary | ICD-10-CM | POA: Insufficient documentation

## 2010-07-14 NOTE — Telephone Encounter (Signed)
Will do order for Marion  

## 2010-07-14 NOTE — Telephone Encounter (Signed)
Pt has appt scheduled for 6/6 for bone density at [facility].  Daughter is asking that we send an order to [facility].

## 2010-07-16 NOTE — Telephone Encounter (Signed)
Bone Density order faxed today, 07/16/2010. MK

## 2010-07-29 ENCOUNTER — Encounter: Payer: Self-pay | Admitting: Cardiology

## 2010-07-29 LAB — HM MAMMOGRAPHY: HM Mammogram: NEGATIVE

## 2010-07-29 LAB — HM DEXA SCAN

## 2010-08-04 ENCOUNTER — Other Ambulatory Visit: Payer: Self-pay | Admitting: Cardiology

## 2010-08-04 DIAGNOSIS — Z9109 Other allergy status, other than to drugs and biological substances: Secondary | ICD-10-CM

## 2010-08-04 MED ORDER — FEXOFENADINE HCL 60 MG PO TABS: 60.0000 mg | ORAL_TABLET | Freq: Two times a day (BID) | ORAL | Status: DC

## 2010-08-04 NOTE — Telephone Encounter (Signed)
Called and ok rx

## 2010-08-04 NOTE — Telephone Encounter (Signed)
Patient's grand-daughter is calling to find out why her prescription refill request was denied for Allegra.

## 2010-08-07 ENCOUNTER — Emergency Department (HOSPITAL_COMMUNITY): Payer: Medicare Other

## 2010-08-07 ENCOUNTER — Emergency Department (HOSPITAL_COMMUNITY)
Admission: EM | Admit: 2010-08-07 | Discharge: 2010-08-07 | Disposition: A | Discharge status: Home / Self Care | Payer: Medicare Other | Attending: Emergency Medicine | Admitting: Emergency Medicine

## 2010-08-07 DIAGNOSIS — M542 Cervicalgia: Secondary | ICD-10-CM | POA: Insufficient documentation

## 2010-08-07 DIAGNOSIS — E7800 Pure hypercholesterolemia, unspecified: Secondary | ICD-10-CM | POA: Insufficient documentation

## 2010-08-07 DIAGNOSIS — I10 Essential (primary) hypertension: Secondary | ICD-10-CM | POA: Insufficient documentation

## 2010-08-07 DIAGNOSIS — IMO0002 Reserved for concepts with insufficient information to code with codable children: Secondary | ICD-10-CM | POA: Insufficient documentation

## 2010-08-07 DIAGNOSIS — G319 Degenerative disease of nervous system, unspecified: Secondary | ICD-10-CM | POA: Insufficient documentation

## 2010-08-07 DIAGNOSIS — S0990XA Unspecified injury of head, initial encounter: Secondary | ICD-10-CM | POA: Insufficient documentation

## 2010-08-07 DIAGNOSIS — Y92009 Unspecified place in unspecified non-institutional (private) residence as the place of occurrence of the external cause: Secondary | ICD-10-CM | POA: Insufficient documentation

## 2010-08-07 DIAGNOSIS — S0003XA Contusion of scalp, initial encounter: Secondary | ICD-10-CM | POA: Insufficient documentation

## 2010-08-07 DIAGNOSIS — W1809XA Striking against other object with subsequent fall, initial encounter: Secondary | ICD-10-CM | POA: Insufficient documentation

## 2010-11-03 ENCOUNTER — Other Ambulatory Visit: Payer: Self-pay | Admitting: Cardiology

## 2010-11-03 MED ORDER — AMLODIPINE BESYLATE 5 MG PO TABS: 5.0000 mg | ORAL_TABLET | Freq: Every day | ORAL | Status: DC

## 2010-11-03 MED ORDER — LISINOPRIL 20 MG PO TABS: 20.0000 mg | ORAL_TABLET | Freq: Every day | ORAL | Status: DC

## 2010-11-03 NOTE — Telephone Encounter (Signed)
Refilled Meds from fax  

## 2010-11-19 LAB — BASIC METABOLIC PANEL
BUN: 7
BUN: 7
BUN: 7
BUN: 7
CO2: 21
CO2: 23
CO2: 23
Calcium: 7.8 — ABNORMAL LOW
Calcium: 8.1 — ABNORMAL LOW
Calcium: 8.1 — ABNORMAL LOW
Calcium: 8.5
Calcium: 8.5
Chloride: 80 — ABNORMAL LOW
Chloride: 97
Chloride: 99
Creatinine, Ser: 0.9
Creatinine, Ser: 0.91
Creatinine, Ser: 0.91
Creatinine, Ser: 0.92
Creatinine, Ser: 0.95
GFR calc Af Amer: >60
GFR calc Af Amer: >60
GFR calc Af Amer: >60
GFR calc Af Amer: >60
GFR calc Af Amer: >60
GFR calc non Af Amer: 48 — ABNORMAL LOW
GFR calc non Af Amer: 51 — ABNORMAL LOW
GFR calc non Af Amer: 54 — ABNORMAL LOW
GFR calc non Af Amer: 58 — ABNORMAL LOW
GFR calc non Af Amer: 58 — ABNORMAL LOW
Glucose, Bld: 101 — ABNORMAL HIGH
Glucose, Bld: 115 — ABNORMAL HIGH
Glucose, Bld: 86
Glucose, Bld: 98
Potassium: 4
Potassium: 4.1
Potassium: 4.3
Sodium: 115 — CL
Sodium: 117 — CL
Sodium: 126 — ABNORMAL LOW
Sodium: 127 — ABNORMAL LOW
Sodium: 128 — ABNORMAL LOW

## 2010-11-19 LAB — SODIUM
Sodium: 107 — CL
Sodium: 108 — CL
Sodium: 109 — CL
Sodium: 125 — ABNORMAL LOW
Sodium: 126 — ABNORMAL LOW
Sodium: 128 — ABNORMAL LOW
Sodium: 130 — ABNORMAL LOW

## 2010-11-19 LAB — CBC
HCT: 30.2 — ABNORMAL LOW
Hemoglobin: 10.5 — ABNORMAL LOW
Hemoglobin: 12.1
MCHC: 33.9
MCV: 89.3
Platelets: 364
RBC: 3.37 — ABNORMAL LOW
RBC: 3.38 — ABNORMAL LOW
RBC: 3.89
RDW: 12.4
WBC: 11.2 — ABNORMAL HIGH
WBC: 12.8 — ABNORMAL HIGH
WBC: 14.7 — ABNORMAL HIGH

## 2010-11-19 LAB — URINE MICROSCOPIC-ADD ON

## 2010-11-19 LAB — POCT I-STAT, CHEM 8
BUN: 10
Calcium, Ion: 1.01 — ABNORMAL LOW
Chloride: 71 — CL
Glucose, Bld: 121 — ABNORMAL HIGH
Potassium: 3.1 — ABNORMAL LOW

## 2010-11-19 LAB — MAGNESIUM: Magnesium: 1.3 — ABNORMAL LOW

## 2010-11-19 LAB — COMPREHENSIVE METABOLIC PANEL
ALT: 35
Albumin: 3.2 — ABNORMAL LOW
Alkaline Phosphatase: 63
BUN: 9
Chloride: <70 — CL
Glucose, Bld: 118 — ABNORMAL HIGH
Potassium: 3.1 — ABNORMAL LOW
Sodium: 108 — CL
Total Bilirubin: 1
Total Protein: 6.6

## 2010-11-19 LAB — URINALYSIS, ROUTINE W REFLEX MICROSCOPIC
Glucose, UA: NEGATIVE
Glucose, UA: NEGATIVE
Ketones, ur: NEGATIVE
Protein, ur: 100 — AB
Protein, ur: 100 — AB
Specific Gravity, Urine: 1.011
Urobilinogen, UA: 1
pH: 7.5

## 2010-11-19 LAB — CULTURE, BLOOD (ROUTINE X 2): Culture: NO GROWTH

## 2010-11-19 LAB — DIFFERENTIAL
Lymphocytes Relative: 13
Monocytes Absolute: 1.1 — ABNORMAL HIGH
Monocytes Relative: 8
Neutro Abs: 11.4 — ABNORMAL HIGH
Neutrophils Relative %: 78 — ABNORMAL HIGH

## 2010-11-19 LAB — TSH: TSH: 0.384

## 2010-11-19 LAB — CORTISOL: Cortisol, Plasma: 11.4

## 2010-11-26 ENCOUNTER — Encounter: Payer: Self-pay | Admitting: Family Medicine

## 2011-01-18 ENCOUNTER — Other Ambulatory Visit: Payer: Medicare Other | Admitting: *Deleted

## 2011-01-18 ENCOUNTER — Encounter: Payer: Self-pay | Admitting: Cardiology

## 2011-01-18 ENCOUNTER — Ambulatory Visit (INDEPENDENT_AMBULATORY_CARE_PROVIDER_SITE_OTHER): Payer: Medicare Other | Admitting: Cardiology

## 2011-01-18 VITALS — BP 129/63 | HR 94 | Ht 62.0 in | Wt 136.8 lb

## 2011-01-18 DIAGNOSIS — E7800 Pure hypercholesterolemia, unspecified: Secondary | ICD-10-CM

## 2011-01-18 DIAGNOSIS — E785 Hyperlipidemia, unspecified: Secondary | ICD-10-CM

## 2011-01-18 DIAGNOSIS — I10 Essential (primary) hypertension: Secondary | ICD-10-CM

## 2011-01-18 DIAGNOSIS — I119 Hypertensive heart disease without heart failure: Secondary | ICD-10-CM

## 2011-01-18 LAB — LIPID PANEL
Cholesterol: 186 mg/dL (ref 0–200)
LDL Cholesterol: 93 mg/dL (ref 0–99)
Total CHOL/HDL Ratio: 2

## 2011-01-18 LAB — HEPATIC FUNCTION PANEL
AST: 21 U/L (ref 0–37)
Alkaline Phosphatase: 56 U/L (ref 39–117)
Bilirubin, Direct: 0.1 mg/dL (ref 0.0–0.3)
Total Protein: 6.7 g/dL (ref 6.0–8.3)

## 2011-01-18 LAB — BASIC METABOLIC PANEL
BUN: 29 mg/dL — ABNORMAL HIGH (ref 6–23)
Chloride: 100 mEq/L (ref 96–112)
Creatinine, Ser: 1.7 mg/dL — ABNORMAL HIGH (ref 0.4–1.2)
Glucose, Bld: 93 mg/dL (ref 70–99)

## 2011-01-18 NOTE — Progress Notes (Signed)
Cindy Harvey Disney Date of Birth:  October 20, 1916 [facility] [address]   [address] [place], [place]  [address] [phone]           Fax   [phone]  HPI: This pleasant 77-year-old widow is seen for a scheduled 6 month followup office visit.  She has a history of essential hypertension and a history of high cholesterol.  She does not have any history of ischemic heart disease chest pain or angina pectoris.  She has had a past history of anxiety and depression.  Last visit she denies any new symptoms.  Current Outpatient Prescriptions  Medication Sig Dispense Refill  . acetaminophen (TYLENOL) 500 MG tablet OTC as directed.       Marland Kitchen ALPRAZolam (XANAX) 0.25 MG tablet Take 0.25 mg by mouth 2 (two) times daily as needed.        Marland Kitchen amLODipine (NORVASC) 5 MG tablet Take 1 tablet (5 mg total) by mouth daily.  30 tablet  11  . Calcium Carbonate-Vitamin D (CALCIUM 600+D) 600-200 MG-UNIT TABS Take 2 tablets by mouth daily.       . fexofenadine (ALLEGRA) 60 MG tablet Take 1 tablet (60 mg total) by mouth 2 (two) times daily.  60 tablet  11  . guaiFENesin (MUCINEX) 600 MG 12 hr tablet Take 600 mg by mouth as needed.        Marland Kitchen lisinopril-hydrochlorothiazide (PRINZIDE,ZESTORETIC) 10-12.5 MG per tablet Take 1 tablet by mouth daily.        . Multiple Vitamins-Minerals (OCUVITE PO) Take by mouth daily.        . multivitamin (THERAGRAN) per tablet Take 1 tablet by mouth daily.        . niacin (NIASPAN) 500 MG CR tablet Take 1,000 mg by mouth at bedtime.       . Nutritional Supplements (ENSURE PO) Take by mouth at bedtime.        . promethazine (PHENERGAN) 25 MG tablet Take 25 mg by mouth as needed. For nausea        . ZETIA 10 MG tablet TAKE 1 TABLET BY MOUTH EVERY DAY  30 tablet  11  . moxifloxacin (AVELOX) 400 MG tablet Take 400 mg by mouth daily.          Allergies  Allergen Reactions  . Asa Buff (Mag (Buffered Aspirin)     Nose bleeds  . Lipitor (Atorvastatin Calcium)    Headaches & dizzy  . Zocor (Simvastatin - High Dose)     myalgia    Patient Active Problem List  Diagnoses  . HYPERLIPIDEMIA  . HYPERTENSION  . ALLERGIC RHINITIS  . OSTEOARTHRITIS, KNEE  . Depression (emotion)  . Hordeolum  . Post-menopausal  . Osteoporosis screening    History  Smoking status  . Never Smoker   Smokeless tobacco  . Not on file    History  Alcohol Use No    Family History  Problem Relation Age of Onset  . Heart disease Mother   . Stroke Mother   . Stroke Sister   . Cancer Maternal Aunt     breast CA  . Cancer Cousin     breast cancer    Review of Systems: The patient denies any heat or cold intolerance.  No weight gain or weight loss.  The patient denies headaches or blurry vision.  There is no cough or sputum production.  The patient denies dizziness.  There is no hematuria or hematochezia.  The patient denies  any muscle aches or arthritis.  The patient denies any rash.  The patient denies frequent falling or instability.  There is no history of depression or anxiety.  All other systems were reviewed and are negative.   Physical Exam: Filed Vitals:   01/18/11 0923  BP: 129/63  Pulse: 94   the general appearance reveals a well-developed well-nourished elderly woman in no distress.The head and neck exam reveals pupils equal and reactive.  Extraocular movements are full.  There is no scleral icterus.  The mouth and pharynx are normal.  The neck is supple.  The carotids reveal no bruits.  The jugular venous pressure is normal.  The  thyroid is not enlarged.  There is no lymphadenopathy.  The chest is clear to percussion and auscultation.  There are no rales or rhonchi.  Expansion of the chest is symmetrical.  The precordium is quiet.  The first heart sound is normal.  The second heart sound is physiologically split.  There is no  gallop rub or click.  There is a grade 2/6 basilar systolic ejection murmur in the aortic area.  No diastolic murmur. There is  no abnormal lift or heave.  The abdomen is soft and nontender.  The bowel sounds are normal.  The liver and spleen are not enlarged.  There are no abdominal masses.  There are no abdominal bruits.  Extremities reveal good pedal pulses.  There is no phlebitis or edema.  There is no cyanosis or clubbing.  Strength is normal and symmetrical in all extremities.  There is no lateralizing weakness.  There are no sensory deficits.  The skin is warm and dry.  There is no rash.      Assessment / Plan: Continue same medication.  Recheck in 6 months for office visit and fasting lab work.  Blood work today is pending

## 2011-01-18 NOTE — Patient Instructions (Addendum)
Will obtain labs today and call you with results  Your physician recommends that you continue on your current medications as directed. Please refer to the Current Medication list given to you today. Your physician wants you to follow-up in: 6 months with fasting labs You will receive a reminder letter in the mail two months in advance. If you don't receive a letter, please call our office to schedule the follow-up appointment.

## 2011-01-18 NOTE — Assessment & Plan Note (Signed)
The patient is unable to take statins because of myalgias.  She is on Zetia and niacin which she is tolerating without difficulty.  Blood work today is pending

## 2011-01-18 NOTE — Assessment & Plan Note (Signed)
The patient is not having any symptoms from her blood pressure.  She is tolerating her medication without side effects.  No chest pain dizziness or syncope

## 2011-01-19 ENCOUNTER — Encounter: Payer: Self-pay | Admitting: *Deleted

## 2011-02-12 ENCOUNTER — Other Ambulatory Visit: Payer: Self-pay | Admitting: *Deleted

## 2011-02-12 MED ORDER — NIACIN ER (ANTIHYPERLIPIDEMIC) 500 MG PO TBCR: 1000.0000 mg | EXTENDED_RELEASE_TABLET | Freq: Every day | ORAL | Status: DC

## 2011-02-12 NOTE — Telephone Encounter (Signed)
Refilled niaspan 

## 2011-05-13 DIAGNOSIS — H93299 Other abnormal auditory perceptions, unspecified ear: Secondary | ICD-10-CM | POA: Diagnosis not present

## 2011-05-20 ENCOUNTER — Telehealth: Payer: Self-pay | Admitting: Cardiology

## 2011-05-20 NOTE — Telephone Encounter (Signed)
Patient has macular degenerative disease and wanted to let us know and [facility] coming into the home to help with some teaching.  [facility] will be helping her adjust with visual changes and helping make home more safe.  Just wanted to let us know

## 2011-05-20 NOTE — Telephone Encounter (Signed)
Patient daughter Geri Seminole [phone] would like return call concerning referral from Dr. Eustaquio Maize for patient eye care.   Please return call to [facility].

## 2011-06-04 ENCOUNTER — Other Ambulatory Visit: Payer: Self-pay | Admitting: Cardiology

## 2011-06-04 NOTE — Telephone Encounter (Signed)
Refilled zetia.

## 2011-06-27 ENCOUNTER — Encounter (HOSPITAL_COMMUNITY): Payer: Self-pay | Admitting: Emergency Medicine

## 2011-06-27 ENCOUNTER — Emergency Department (HOSPITAL_COMMUNITY): Payer: Medicare Other

## 2011-06-27 ENCOUNTER — Emergency Department (HOSPITAL_COMMUNITY)
Admission: EM | Admit: 2011-06-27 | Discharge: 2011-06-27 | Disposition: A | Discharge status: Home / Self Care | Payer: Medicare Other | Attending: Emergency Medicine | Admitting: Emergency Medicine

## 2011-06-27 DIAGNOSIS — E7800 Pure hypercholesterolemia, unspecified: Secondary | ICD-10-CM | POA: Insufficient documentation

## 2011-06-27 DIAGNOSIS — W010XXA Fall on same level from slipping, tripping and stumbling without subsequent striking against object, initial encounter: Secondary | ICD-10-CM | POA: Insufficient documentation

## 2011-06-27 DIAGNOSIS — R51 Headache: Secondary | ICD-10-CM | POA: Diagnosis not present

## 2011-06-27 DIAGNOSIS — S52599A Other fractures of lower end of unspecified radius, initial encounter for closed fracture: Secondary | ICD-10-CM | POA: Diagnosis not present

## 2011-06-27 DIAGNOSIS — M79609 Pain in unspecified limb: Secondary | ICD-10-CM | POA: Diagnosis not present

## 2011-06-27 DIAGNOSIS — S52609A Unspecified fracture of lower end of unspecified ulna, initial encounter for closed fracture: Secondary | ICD-10-CM | POA: Insufficient documentation

## 2011-06-27 DIAGNOSIS — M25539 Pain in unspecified wrist: Secondary | ICD-10-CM | POA: Diagnosis not present

## 2011-06-27 DIAGNOSIS — Z79899 Other long term (current) drug therapy: Secondary | ICD-10-CM | POA: Insufficient documentation

## 2011-06-27 DIAGNOSIS — S0993XA Unspecified injury of face, initial encounter: Secondary | ICD-10-CM | POA: Diagnosis not present

## 2011-06-27 DIAGNOSIS — IMO0002 Reserved for concepts with insufficient information to code with codable children: Secondary | ICD-10-CM

## 2011-06-27 DIAGNOSIS — S63279A Dislocation of unspecified interphalangeal joint of unspecified finger, initial encounter: Secondary | ICD-10-CM | POA: Diagnosis not present

## 2011-06-27 DIAGNOSIS — S022XXA Fracture of nasal bones, initial encounter for closed fracture: Secondary | ICD-10-CM | POA: Diagnosis not present

## 2011-06-27 DIAGNOSIS — S63259A Unspecified dislocation of unspecified finger, initial encounter: Secondary | ICD-10-CM | POA: Diagnosis not present

## 2011-06-27 DIAGNOSIS — M25849 Other specified joint disorders, unspecified hand: Secondary | ICD-10-CM | POA: Diagnosis not present

## 2011-06-27 DIAGNOSIS — S52509A Unspecified fracture of the lower end of unspecified radius, initial encounter for closed fracture: Secondary | ICD-10-CM | POA: Diagnosis not present

## 2011-06-27 DIAGNOSIS — S199XXA Unspecified injury of neck, initial encounter: Secondary | ICD-10-CM | POA: Diagnosis not present

## 2011-06-27 DIAGNOSIS — I10 Essential (primary) hypertension: Secondary | ICD-10-CM | POA: Diagnosis not present

## 2011-06-27 DIAGNOSIS — R220 Localized swelling, mass and lump, head: Secondary | ICD-10-CM | POA: Insufficient documentation

## 2011-06-27 DIAGNOSIS — M25839 Other specified joint disorders, unspecified wrist: Secondary | ICD-10-CM | POA: Diagnosis not present

## 2011-06-27 DIAGNOSIS — R6884 Jaw pain: Secondary | ICD-10-CM | POA: Diagnosis not present

## 2011-06-27 DIAGNOSIS — Y9229 Other specified public building as the place of occurrence of the external cause: Secondary | ICD-10-CM | POA: Insufficient documentation

## 2011-06-27 MED ORDER — HYDROCODONE-ACETAMINOPHEN 5-325 MG PO TABS: 2.0000 | ORAL_TABLET | ORAL | Status: AC | PRN

## 2011-06-27 MED ORDER — CEPHALEXIN 250 MG PO CAPS: 250.0000 mg | ORAL_CAPSULE | Freq: Four times a day (QID) | ORAL | Status: AC

## 2011-06-27 MED ORDER — ACETAMINOPHEN 325 MG PO TABS
650.0000 mg | ORAL_TABLET | Freq: Four times a day (QID) | ORAL | Status: DC | PRN
  Administered 2011-06-27: 650 mg via ORAL
  Filled 2011-06-27: qty 2

## 2011-06-27 MED ORDER — LIDOCAINE HCL 1 % IJ SOLN: 10.0000 mL | Freq: Once | INTRAMUSCULAR | Status: DC

## 2011-06-27 MED ORDER — TETANUS-DIPHTHERIA TOXOIDS TD 5-2 LFU IM INJ
0.5000 mL | INJECTION | Freq: Once | INTRAMUSCULAR | Status: AC
  Administered 2011-06-27: 0.5 mL via INTRAMUSCULAR
  Filled 2011-06-27: qty 0.5

## 2011-06-27 NOTE — ED Notes (Signed)
Per EMS: Pt was on her way to eat at [employer] and tripped on a piece of raised cement in the parking garage.  Abrasion to bridge of nose.  Denies LOC.  Swollen left ring finger.  No dizziness, no neuro deficits.  Refused spine board.  2x2 dressing on nose.

## 2011-06-27 NOTE — ED Notes (Signed)
Ice pack applied to pts head, small bump on forehead, family at bedside

## 2011-06-27 NOTE — ED Provider Notes (Signed)
History     CSN: [id number]  Arrival date & time 06/27/11  1308   First MD Initiated Contact with Patient 06/27/11 1355      Chief Complaint  Patient presents with  . Fall  . Abrasion    nose    HPI The patient presents to the emergency room with a trip and fall. The patient was walking to a restaurant when she tripped on a raised area cement in the parking garage. Patient fell to the ground with both of her hands outstretched. She now primarily has pain in the left ring finger. It is swollen and she has decreased range of motion. She did not lose any consciousness but she did hit her head and has an abrasion on her nose. She denies any dizziness, numbness or weakness. She did not want to be transported on a long spine board. She does continue to have pain in her right wrist as well.  She denies any neck or back pain.  Past Medical History  Diagnosis Date  . Hypertension   . Hypercholesterolemia   . Osteoarthritis     right knee  . Allergy     allergic rhinitis  . Urinary incontinence   . Macular degeneration   . Hearing loss   . History of shingles   . Pneumonia 2009    with dehydration and electrolyte imbalance    Past Surgical History  Procedure Date  . Tonsillectomy 1928    Family History  Problem Relation Age of Onset  . Heart disease Mother   . Stroke Mother   . Stroke Sister   . Cancer Maternal Aunt     breast CA  . Cancer Cousin     breast cancer    History  Substance Use Topics  . Smoking status: Never Smoker   . Smokeless tobacco: Not on file  . Alcohol Use: No    OB History    Grav Para Term Preterm Abortions TAB SAB Ect Mult Living                  Review of Systems  All other systems reviewed and are negative.    Allergies  Asa buff (mag; Lipitor; and Zocor  Home Medications   Current Outpatient Rx  Name Route Sig Dispense Refill  . ACETAMINOPHEN 500 MG PO TABS  OTC as directed.     Marland Kitchen ALPRAZOLAM 0.25 MG PO TABS Oral Take 0.25 mg  by mouth 2 (two) times daily as needed.      Marland Kitchen AMLODIPINE BESYLATE 5 MG PO TABS Oral Take 1 tablet (5 mg total) by mouth daily. 30 tablet 11  . CALCIUM CARBONATE-VITAMIN D 600-200 MG-UNIT PO TABS Oral Take 2 tablets by mouth daily.     Marland Kitchen FEXOFENADINE HCL 60 MG PO TABS Oral Take 1 tablet (60 mg total) by mouth 2 (two) times daily. 60 tablet 11  . GUAIFENESIN ER 600 MG PO TB12 Oral Take 600 mg by mouth as needed.      Marland Kitchen LISINOPRIL-HYDROCHLOROTHIAZIDE 10-12.5 MG PO TABS Oral Take 1 tablet by mouth daily.      Marland Kitchen MOXIFLOXACIN HCL 400 MG PO TABS Oral Take 400 mg by mouth daily.      . OCUVITE PO Oral Take by mouth daily.      . MULTIVITAMINS PO TABS Oral Take 1 tablet by mouth daily.      Marland Kitchen NIACIN ER (ANTIHYPERLIPIDEMIC) 500 MG PO TBCR Oral Take 2 tablets (1,000 mg total) by  mouth at bedtime. 60 tablet 12  . ENSURE PO Oral Take by mouth at bedtime.      Marland Kitchen PROMETHAZINE HCL 25 MG PO TABS Oral Take 25 mg by mouth as needed. For nausea      . ZETIA 10 MG PO TABS  TAKE 1 TABLET BY MOUTH EVERY DAY 30 tablet 11    BP 142/58  Pulse 85  Temp(Src) 97.6 F (36.4 C) (Oral)  Resp 26  SpO2 98%  Physical Exam  Nursing note and vitals reviewed. Constitutional: She appears well-developed and well-nourished. No distress.  HENT:  Head: Normocephalic and atraumatic.  Right Ear: External ear normal.  Left Ear: External ear normal.  Nose: Sinus tenderness present. No nasal septal hematoma. No epistaxis.       Abrasion nares, ttp edema  Eyes: Conjunctivae are normal. Right eye exhibits no discharge. Left eye exhibits no discharge. No scleral icterus.  Neck: Neck supple. No tracheal deviation present.  Cardiovascular: Normal rate, regular rhythm and intact distal pulses.   Pulmonary/Chest: Effort normal and breath sounds normal. No stridor. No respiratory distress. She has no wheezes. She has no rales.  Abdominal: Soft. Bowel sounds are normal. She exhibits no distension. There is no tenderness. There is no  rebound and no guarding.  Musculoskeletal: She exhibits no edema and no tenderness.  Neurological: She is alert. She has normal strength. No sensory deficit. Cranial nerve deficit:  no gross defecits noted. She exhibits normal muscle tone. She displays no seizure activity. Coordination normal.  Skin: Skin is warm and dry. No rash noted.  Psychiatric: She has a normal mood and affect.    ED Course  Procedures (including critical care time)  Labs Reviewed - No data to display Dg Wrist Complete Right  06/27/2011  *RADIOLOGY REPORT*  Clinical Data: Larey Seat and injured right wrist.  RIGHT WRIST - COMPLETE 3+ VIEW 06/27/2011:  Comparison: Right hand x-rays 06/14/2010.  No prior wrist imaging.  Findings: Mildly impacted intra-articular distal radius fracture. Associated avulsion fracture involving the ulnar styloid. Osteopenia.  Moderate radiocarpal joint space narrowing.  Joint space narrowing involving the scaphoid - trapezium and scaphoid - trapezoid joints.  Joint space narrowing involving the trapezium - first metacarpal joint.  No fractures involving the carpal bones proper.  Associated dorsal and medial soft tissue swelling.  IMPRESSION: Mildly impacted intra-articular distal radius fracture with an associated avulsion fracture involving the ulnar styloid. Osteopenia.  Osteoarthritis.  Per CMS PQRS reporting requirements (PQRS Measure 24): Given the patient's age of greater than 50 and the fracture site (hip, distal radius, or spine), the patient should be tested for osteoporosis using DXA, and the appropriate treatment considered based on the DXA results.  Original Report Authenticated By: Arnell Sieving, M.D.   Ct Head Wo Contrast  06/27/2011  *RADIOLOGY REPORT*  Clinical Data:  Pain after fall  CT HEAD WITHOUT CONTRAST CT MAXILLOFACIAL WITHOUT CONTRAST  Technique:  Multidetector CT imaging of the head and maxillofacial structures were performed using the standard protocol without intravenous  contrast. Multiplanar CT image reconstructions of the maxillofacial structures were also generated.  Comparison:   None.  CT HEAD  Findings: There is an air-fluid level within the right maxillary sinus.  The orbits and calvarium have a normal appearance. The ventricles are normal in size, shape, and position.  There is no mass effect or midline shift.  No acute hemorrhage or abnormal extra-axial fluid collections are identified.  The gray/white differentiation is normal.  IMPRESSION: There is no  evidence of acute intracranial abnormality or skull fracture.  CT MAXILLOFACIAL  Findings:   There is a nondisplaced comminuted fracture of the nasal bones at the midline.  There is an air-fluid level within the right maxillary sinus and gas within the soft tissues lateral to the right maxillary sinus suggesting a fracture; however, the fracture must be nondisplaced, as it is not visualized.  IMPRESSION: Comminuted, nondisplaced nasal bone fractures.  Occult fracture of the right maxillary sinus is suspected laterally with abnormal soft tissue gas adjacent to the lateral wall and an air-fluid level within the sinus.  Original Report Authenticated By: Brandon Melnick, M.D.   Dg Hand Complete Left  06/27/2011  *RADIOLOGY REPORT*  Clinical Data: Larey Seat and injured left hand, pain and deformity involving the ring finger.  LEFT HAND - COMPLETE 3+ VIEW 06/27/2011:  Comparison: None.  Findings: Dislocation of the PIP joint of the ring finger dorsally and medially.  No visible associated fractures.  No evidence of acute fracture or dislocation elsewhere.  Generalized osteopenia. Joint space narrowing involving IP joints of all of the fingers.  IMPRESSION: Dislocation of the PIP joint of the ring finger.  No visible associated fractures.  Osteopenia.  Osteoarthritis.  Original Report Authenticated By: Arnell Sieving, M.D.   Ct Maxillofacial Wo Cm  06/27/2011  *RADIOLOGY REPORT*  Clinical Data:  Pain after fall  CT HEAD WITHOUT  CONTRAST CT MAXILLOFACIAL WITHOUT CONTRAST  Technique:  Multidetector CT imaging of the head and maxillofacial structures were performed using the standard protocol without intravenous contrast. Multiplanar CT image reconstructions of the maxillofacial structures were also generated.  Comparison:   None.  CT HEAD  Findings: There is an air-fluid level within the right maxillary sinus.  The orbits and calvarium have a normal appearance. The ventricles are normal in size, shape, and position.  There is no mass effect or midline shift.  No acute hemorrhage or abnormal extra-axial fluid collections are identified.  The gray/white differentiation is normal.  IMPRESSION: There is no evidence of acute intracranial abnormality or skull fracture.  CT MAXILLOFACIAL  Findings:   There is a nondisplaced comminuted fracture of the nasal bones at the midline.  There is an air-fluid level within the right maxillary sinus and gas within the soft tissues lateral to the right maxillary sinus suggesting a fracture; however, the fracture must be nondisplaced, as it is not visualized.  IMPRESSION: Comminuted, nondisplaced nasal bone fractures.  Occult fracture of the right maxillary sinus is suspected laterally with abnormal soft tissue gas adjacent to the lateral wall and an air-fluid level within the sinus.  Original Report Authenticated By: Brandon Melnick, M.D.     1. Distal radius fracture   2. Closed traumatic PIP dislocation   3. Nasal fracture       MDM  Pt was placed in a sugar tong splint by the ortho tech on her right wrist.  PIP dislocation was reduced under my supervision by Dr Ashley Royalty. Discussed findings with patient and family.  She has good support at home and they are comfortable with discharge.  No sign of head or spine injury.        Celene Kras, MD 06/27/11 [phone]

## 2011-06-27 NOTE — ED Notes (Signed)
WUJ:[id number]<JY> Expected date:06/27/11<BR> Expected time: 1:04 PM<BR> Means of arrival:Ambulance<BR> Comments:<BR> M11. 89 f. Trip and fall. Abrasion to nose. No pain, no LOC, no bloodthinners, refuse LSB. 5 mins

## 2011-06-27 NOTE — Discharge Instructions (Signed)
Extremity Fracture Broken bones (fractures) take several weeks to months to heal depending on the bone involved. The broken ends must be lined up correctly and kept in position for proper healing. Do not remove the splint, immobilizer, or cast that has been applied to treat your injury. This is the most important part of your treatment. Other measures to treat fractures include:  Keeping the injured limb at rest and elevated above your heart as recommended by your caregiver. This will help reduce pain and swelling.   Ice packs can be applied to your fracture site for 20-30 minutes every 3-4 hours over the next 2-3 days.   Pain medicine Kim be prescribed in the first days after a fracture.  SEEK IMMEDIATE MEDICAL CARE IF:  You develop increasing pain or pressure in the injured limb, or if it becomes cold, numb, or pale.   There is increasing pain with motion of your fingers or toes.  Document Released: 03/18/2004 Document Revised: 01/28/2011 Document Reviewed: 05/29/2008 [facility] Patient Information 2012 [place], [place].Finger Dislocation Finger dislocation is the displacement of bones in your finger at the joints. Most commonly, finger dislocation occurs at the proximal interphalangeal joint (the joint closest to your knuckle). Very strong, fibrous tissues (ligaments) and joint capsules connect the three bones of your fingers.  CAUSES Dislocation is caused by a forceful impact. This impact moves these bones off the joint and often tears your ligaments.  SYMPTOMS Symptoms of finger dislocation include:  Deformity of your finger.   Pain, with loss of movement.  DIAGNOSIS  Finger dislocation is diagnosed with a physical exam. Often, X-ray exams are done to see if you have associated injuries, such as bone fractures. TREATMENT  Finger dislocations are treated by putting your bones back into position (reduction) either by manually moving the bones back into place or through surgery. Your finger  is then kept in a fixed position (immobilized) with the use of a dressing or splint for a brief period. When your ligament has to be surgically repaired, it needs to be kept in a fixed position with a dressing or splint for 1 to 2 weeks. Because joint stiffness is a long-term complication of finger dislocation, hand exercises or physical therapy to increase the range of motion and to regain strength is usually started as soon as the ligament is healed. Exercises and therapy generally last no more than 3 months. HOME CARE INSTRUCTIONS The following measures can help to reduce pain and speed up the healing process:  Rest your injured joint. Do not move until instructed otherwise by your caregiver. Avoid activities similar to the one that caused your injury.   Apply ice to your injured joint for the first day or 2 after your reduction or as directed by your caregiver. Applying ice helps to reduce inflammation and pain.   Put ice in a plastic bag.   Place a towel between your skin and the bag.   Leave the ice on for 15 to 20 minutes at a time, every 2 hours while you are awake.   Elevate your hand above your heart as directed by your caregiver to reduce swelling.   Take over-the-counter or prescription medicine for pain as your caregiver instructs you.  SEEK IMMEDIATE MEDICAL CARE IF:  Your dressing or splint becomes damaged.   Your pain becomes worse rather than better.   You lose feeling in your finger, or it becomes cold and white.  MAKE SURE YOU:  Understand these instructions.   Will  watch your condition.   Will get help right away if you are not doing well or get worse.  Document Released: 02/06/2000 Document Revised: 01/28/2011 Document Reviewed: 11/29/2010 [facility] Patient Information 2012 [place], [place].Nasal Fracture A nasal fracture is a break or crack in the bones of the nose. A minor break usually heals in a month. You often will receive black eyes from a nasal fracture.  This is not a cause for concern. The black eyes will go away over 1 to 2 weeks.  DIAGNOSIS  Your caregiver Bedoy want to examine you if you are concerned about a fracture of the nose. X-rays of the nose Mclear not show a nasal fracture even when one is present. Sometimes your caregiver must wait 1 to 5 days after the injury to re-check the nose for alignment and to take additional X-rays. Sometimes the caregiver must wait until the swelling has gone down. TREATMENT Minor fractures that have caused no deformity often do not require treatment. More serious fractures where bones are displaced Rust require surgery. This will take place after the swelling is gone. Surgery will stabilize and align the fracture. HOME CARE INSTRUCTIONS   Put ice on the injured area.   Put ice in a plastic bag.   Place a towel between your skin and the bag.   Leave the ice on for 15 to 20 minutes, 3 to 4 times a day.   Take medications as directed by your caregiver.   Only take over-the-counter or prescription medicines for pain, discomfort, or fever as directed by your caregiver.   If your nose starts bleeding, squeeze the soft parts of the nose against the center wall while you are sitting in an upright position for 10 minutes.   Contact sports should be avoided for at least 3 to 4 weeks or as directed by your caregiver.  SEEK MEDICAL CARE IF:  Your pain increases or becomes severe.   You continue to have nosebleeds.   The shape of your nose does not return to normal within 5 days.   You have pus draining from the nose.  SEEK IMMEDIATE MEDICAL CARE IF:   You have bleeding from your nose that does not stop after 20 minutes of pinching the nostrils closed and keeping ice on the nose.   You have clear fluid draining from your nose.   You notice a grape-like swelling on the dividing wall between the nostrils (septum). This is a collection of blood (hematoma) that must be drained to help prevent infection.   You  have difficulty moving your eyes.   You have recurrent vomiting.  Document Released: 02/06/2000 Document Revised: 01/28/2011 Document Reviewed: 05/25/2010 [facility] Patient Information 2012 [place], [place].

## 2011-06-28 DIAGNOSIS — S52599A Other fractures of lower end of unspecified radius, initial encounter for closed fracture: Secondary | ICD-10-CM | POA: Diagnosis not present

## 2011-06-28 DIAGNOSIS — S6390XA Sprain of unspecified part of unspecified wrist and hand, initial encounter: Secondary | ICD-10-CM | POA: Diagnosis not present

## 2011-06-29 DIAGNOSIS — S0003XA Contusion of scalp, initial encounter: Secondary | ICD-10-CM | POA: Diagnosis not present

## 2011-06-29 DIAGNOSIS — J3489 Other specified disorders of nose and nasal sinuses: Secondary | ICD-10-CM | POA: Diagnosis not present

## 2011-07-07 DIAGNOSIS — S52599A Other fractures of lower end of unspecified radius, initial encounter for closed fracture: Secondary | ICD-10-CM | POA: Diagnosis not present

## 2011-07-09 DIAGNOSIS — H35059 Retinal neovascularization, unspecified, unspecified eye: Secondary | ICD-10-CM | POA: Diagnosis not present

## 2011-07-09 DIAGNOSIS — S52599A Other fractures of lower end of unspecified radius, initial encounter for closed fracture: Secondary | ICD-10-CM | POA: Diagnosis not present

## 2011-07-09 DIAGNOSIS — H353290 Exudative age-related macular degeneration, unspecified eye, stage unspecified: Secondary | ICD-10-CM | POA: Diagnosis not present

## 2011-07-12 DIAGNOSIS — I10 Essential (primary) hypertension: Secondary | ICD-10-CM | POA: Diagnosis not present

## 2011-07-12 DIAGNOSIS — Z9181 History of falling: Secondary | ICD-10-CM | POA: Diagnosis not present

## 2011-07-12 DIAGNOSIS — Z5189 Encounter for other specified aftercare: Secondary | ICD-10-CM | POA: Diagnosis not present

## 2011-07-12 DIAGNOSIS — R262 Difficulty in walking, not elsewhere classified: Secondary | ICD-10-CM | POA: Diagnosis not present

## 2011-07-12 DIAGNOSIS — S5290XD Unspecified fracture of unspecified forearm, subsequent encounter for closed fracture with routine healing: Secondary | ICD-10-CM | POA: Diagnosis not present

## 2011-07-12 DIAGNOSIS — R279 Unspecified lack of coordination: Secondary | ICD-10-CM | POA: Diagnosis not present

## 2011-07-12 DIAGNOSIS — H353290 Exudative age-related macular degeneration, unspecified eye, stage unspecified: Secondary | ICD-10-CM | POA: Diagnosis not present

## 2011-07-16 DIAGNOSIS — R262 Difficulty in walking, not elsewhere classified: Secondary | ICD-10-CM | POA: Diagnosis not present

## 2011-07-16 DIAGNOSIS — H353290 Exudative age-related macular degeneration, unspecified eye, stage unspecified: Secondary | ICD-10-CM | POA: Diagnosis not present

## 2011-07-16 DIAGNOSIS — S6390XA Sprain of unspecified part of unspecified wrist and hand, initial encounter: Secondary | ICD-10-CM | POA: Diagnosis not present

## 2011-07-16 DIAGNOSIS — I10 Essential (primary) hypertension: Secondary | ICD-10-CM | POA: Diagnosis not present

## 2011-07-16 DIAGNOSIS — S5290XD Unspecified fracture of unspecified forearm, subsequent encounter for closed fracture with routine healing: Secondary | ICD-10-CM | POA: Diagnosis not present

## 2011-07-16 DIAGNOSIS — Z5189 Encounter for other specified aftercare: Secondary | ICD-10-CM | POA: Diagnosis not present

## 2011-07-16 DIAGNOSIS — R279 Unspecified lack of coordination: Secondary | ICD-10-CM | POA: Diagnosis not present

## 2011-07-20 DIAGNOSIS — S5290XD Unspecified fracture of unspecified forearm, subsequent encounter for closed fracture with routine healing: Secondary | ICD-10-CM | POA: Diagnosis not present

## 2011-07-20 DIAGNOSIS — R279 Unspecified lack of coordination: Secondary | ICD-10-CM | POA: Diagnosis not present

## 2011-07-20 DIAGNOSIS — R262 Difficulty in walking, not elsewhere classified: Secondary | ICD-10-CM | POA: Diagnosis not present

## 2011-07-20 DIAGNOSIS — I10 Essential (primary) hypertension: Secondary | ICD-10-CM | POA: Diagnosis not present

## 2011-07-20 DIAGNOSIS — Z5189 Encounter for other specified aftercare: Secondary | ICD-10-CM | POA: Diagnosis not present

## 2011-07-20 DIAGNOSIS — H353290 Exudative age-related macular degeneration, unspecified eye, stage unspecified: Secondary | ICD-10-CM | POA: Diagnosis not present

## 2011-07-21 ENCOUNTER — Other Ambulatory Visit: Payer: Medicare Other

## 2011-07-21 ENCOUNTER — Ambulatory Visit (INDEPENDENT_AMBULATORY_CARE_PROVIDER_SITE_OTHER): Payer: Medicare Other | Admitting: Cardiology

## 2011-07-21 ENCOUNTER — Encounter: Payer: Self-pay | Admitting: Cardiology

## 2011-07-21 VITALS — BP 122/58 | HR 76 | Ht 62.0 in | Wt 132.0 lb

## 2011-07-21 DIAGNOSIS — R011 Cardiac murmur, unspecified: Secondary | ICD-10-CM | POA: Diagnosis not present

## 2011-07-21 DIAGNOSIS — E785 Hyperlipidemia, unspecified: Secondary | ICD-10-CM | POA: Diagnosis not present

## 2011-07-21 DIAGNOSIS — I119 Hypertensive heart disease without heart failure: Secondary | ICD-10-CM

## 2011-07-21 DIAGNOSIS — E7800 Pure hypercholesterolemia, unspecified: Secondary | ICD-10-CM | POA: Diagnosis not present

## 2011-07-21 DIAGNOSIS — I10 Essential (primary) hypertension: Secondary | ICD-10-CM

## 2011-07-21 LAB — HEPATIC FUNCTION PANEL
ALT: 16 U/L (ref 0–35)
AST: 21 U/L (ref 0–37)
Total Protein: 7 g/dL (ref 6.0–8.3)

## 2011-07-21 LAB — LIPID PANEL
Cholesterol: 181 mg/dL (ref 0–200)
HDL: 95.1 mg/dL (ref >39.00)
Triglycerides: 52 mg/dL (ref 0.0–149.0)
VLDL: 10.4 mg/dL (ref 0.0–40.0)

## 2011-07-21 LAB — BASIC METABOLIC PANEL
Calcium: 9.7 mg/dL (ref 8.4–10.5)
GFR: 27.59 mL/min — ABNORMAL LOW (ref >60.00)
Glucose, Bld: 93 mg/dL (ref 70–99)
Potassium: 3.7 mEq/L (ref 3.5–5.1)
Sodium: 133 mEq/L — ABNORMAL LOW (ref 135–145)

## 2011-07-21 NOTE — Assessment & Plan Note (Signed)
The patient has a history of hypercholesterolemia.  She does not tolerate statins.  She is on ezetimibe 10 mg daily and niacin 500 mg daily which she seems to be tolerating well

## 2011-07-21 NOTE — Progress Notes (Signed)
Quick Note:  Please report to patient. The recent labs are stable. Continue same medication and careful diet. Serum Na is mildly low so eat just a little more salt. ______

## 2011-07-21 NOTE — Assessment & Plan Note (Signed)
Blood pressure has been stable on current therapy.  No headaches.  Her unsteady gait appears to be related to her declining vision.  She is going to be getting physical therapy to help with balance issues.

## 2011-07-21 NOTE — Assessment & Plan Note (Signed)
The patient has a systolic ejection murmur at the base.  We will have her return for her two-dimensional echocardiogram.  She has not had a prior echo.

## 2011-07-21 NOTE — Progress Notes (Signed)
Patient ID: Cindy Harvey, female   DOB: 17-Dec-1916, 77 y.o.   MRN: [id number]

## 2011-07-21 NOTE — Patient Instructions (Signed)
Your physician has requested that you have an echocardiogram. Echocardiography is a painless test that uses sound waves to create images of your heart. It provides your doctor with information about the size and shape of your heart and how well your heart's chambers and valves are working. This procedure takes approximately one hour. There are no restrictions for this procedure.  Your physician wants you to follow-up in: 6 month with fasting labs You will receive a reminder letter in the mail two months in advance. If you don't receive a letter, please call our office to schedule the follow-up appointment.  Your physician recommends that you continue on your current medications as directed. Please refer to the Current Medication list given to you today.

## 2011-07-21 NOTE — Progress Notes (Signed)
Cindy Harvey Date of Birth:  1916/03/01 [facility] [address] [place], [place]  [address] [phone]  Fax   [phone]  HPI: This pleasant 78 year old widowed woman in for a scheduled followup office visit.  His history of high blood pressure and high cholesterol.  She does not have any history of ischemic heart disease.  She has had some problems with poor balance.  She recently fell in the parking deck near [employer] and fractured her right wrist.  She also hit her head.  She was seen at [facility] long emergency room where CT scans were negative.  Current Outpatient Prescriptions  Medication Sig Dispense Refill  . acetaminophen (TYLENOL) 500 MG tablet OTC as directed.       Marland Kitchen ALPRAZolam (XANAX) 0.25 MG tablet Take 0.25 mg by mouth 2 (two) times daily as needed.        Marland Kitchen amLODipine (NORVASC) 5 MG tablet Take 1 tablet (5 mg total) by mouth daily.  30 tablet  11  . Calcium Carbonate-Vitamin D (CALCIUM 600+D) 600-200 MG-UNIT TABS Take 2 tablets by mouth daily.       . fexofenadine (ALLEGRA) 60 MG tablet Take 60 mg by mouth daily.      Marland Kitchen guaiFENesin (MUCINEX) 600 MG 12 hr tablet Take 600 mg by mouth as needed.        Marland Kitchen lisinopril (PRINIVIL,ZESTRIL) 20 MG tablet Take 20 mg by mouth daily.      . Multiple Vitamins-Minerals (OCUVITE PO) Take by mouth daily.        . multivitamin (THERAGRAN) per tablet Take 1 tablet by mouth daily.        . niacin (NIASPAN) 500 MG CR tablet Take 2 tablets (1,000 mg total) by mouth at bedtime.  60 tablet  12  . Nutritional Supplements (ENSURE PO) Take by mouth at bedtime.       . promethazine (PHENERGAN) 25 MG tablet Take 25 mg by mouth as needed. For nausea        . ZETIA 10 MG tablet TAKE 1 TABLET BY MOUTH EVERY DAY  30 tablet  11  . DISCONTD: fexofenadine (ALLEGRA) 60 MG tablet Take 1 tablet (60 mg total) by mouth 2 (two) times daily.  60 tablet  11    Allergies  Allergen Reactions  . Asa Buff (Mag (Buffered Aspirin)    Nose bleeds  . Lipitor (Atorvastatin Calcium)     Headaches & dizzy  . Zocor (Simvastatin - High Dose)     myalgia    Patient Active Problem List  Diagnoses  . HYPERLIPIDEMIA  . HYPERTENSION  . ALLERGIC RHINITIS  . OSTEOARTHRITIS, KNEE  . Depression (emotion)  . Hordeolum  . Post-menopausal  . Osteoporosis screening  . Heart murmur    History  Smoking status  . Never Smoker   Smokeless tobacco  . Not on file    History  Alcohol Use No    Family History  Problem Relation Age of Onset  . Heart disease Mother   . Stroke Mother   . Stroke Sister   . Cancer Maternal Aunt     breast CA  . Cancer Cousin     breast cancer    Review of Systems: The patient denies any heat or cold intolerance.  No weight gain or weight loss.  The patient denies headaches or blurry vision.  There is no cough or sputum production.  The patient denies dizziness.  There is no hematuria or hematochezia.  The patient denies any muscle aches or arthritis.  The patient denies any rash.  The patient denies frequent falling or instability.  There is no history of depression or anxiety.  All other systems were reviewed and are negative.   Physical Exam: Filed Vitals:   07/21/11 0923  BP: 122/58  Pulse: 76   general appearance reveals a well-developed well-nourished elderly woman in no distress.  Her right wrist is in a brace from the recent fracture.The head and neck exam reveals pupils equal and reactive.  Extraocular movements are full.  There is no scleral icterus.  The mouth and pharynx are normal.  The neck is supple.  The carotids reveal no bruits.  The jugular venous pressure is normal.  The  thyroid is not enlarged.  There is no lymphadenopathy.  The chest is clear to percussion and auscultation.  There are no rales or rhonchi.  Expansion of the chest is symmetrical.  The precordium is quiet.  The first heart sound is normal.  The second heart sound is physiologically split.  There is no gallop  rub or click.  There is a grade 2/6 systolic ejection murmur at the base.  There is no abnormal lift or heave.  The abdomen is soft and nontender.  The bowel sounds are normal.  The liver and spleen are not enlarged.  There are no abdominal masses.  There are no abdominal bruits.  Extremities reveal good pedal pulses.  There is no phlebitis or edema.  There is no cyanosis or clubbing.  Strength is normal and symmetrical in all extremities.  There is no lateralizing weakness.  There are no sensory deficits.  The skin is warm and dry.  There is no rash.      Assessment / Plan: Continue present medication.  She will return for a two-dimensional echocardiogram to evaluate her systolic ejection murmur at the base.  Return in 6 months for followup office visit EKG and fasting lab work

## 2011-07-22 DIAGNOSIS — R262 Difficulty in walking, not elsewhere classified: Secondary | ICD-10-CM | POA: Diagnosis not present

## 2011-07-22 DIAGNOSIS — S5290XD Unspecified fracture of unspecified forearm, subsequent encounter for closed fracture with routine healing: Secondary | ICD-10-CM | POA: Diagnosis not present

## 2011-07-22 DIAGNOSIS — I10 Essential (primary) hypertension: Secondary | ICD-10-CM | POA: Diagnosis not present

## 2011-07-22 DIAGNOSIS — H353290 Exudative age-related macular degeneration, unspecified eye, stage unspecified: Secondary | ICD-10-CM | POA: Diagnosis not present

## 2011-07-22 DIAGNOSIS — Z5189 Encounter for other specified aftercare: Secondary | ICD-10-CM | POA: Diagnosis not present

## 2011-07-22 DIAGNOSIS — R279 Unspecified lack of coordination: Secondary | ICD-10-CM | POA: Diagnosis not present

## 2011-07-23 NOTE — Progress Notes (Signed)
Pt notified of lab results

## 2011-07-26 DIAGNOSIS — H353290 Exudative age-related macular degeneration, unspecified eye, stage unspecified: Secondary | ICD-10-CM | POA: Diagnosis not present

## 2011-07-26 DIAGNOSIS — R262 Difficulty in walking, not elsewhere classified: Secondary | ICD-10-CM | POA: Diagnosis not present

## 2011-07-26 DIAGNOSIS — S5290XD Unspecified fracture of unspecified forearm, subsequent encounter for closed fracture with routine healing: Secondary | ICD-10-CM | POA: Diagnosis not present

## 2011-07-26 DIAGNOSIS — I10 Essential (primary) hypertension: Secondary | ICD-10-CM | POA: Diagnosis not present

## 2011-07-26 DIAGNOSIS — Z5189 Encounter for other specified aftercare: Secondary | ICD-10-CM | POA: Diagnosis not present

## 2011-07-26 DIAGNOSIS — R279 Unspecified lack of coordination: Secondary | ICD-10-CM | POA: Diagnosis not present

## 2011-07-27 ENCOUNTER — Ambulatory Visit (HOSPITAL_COMMUNITY): Payer: Medicare Other | Attending: Cardiovascular Disease | Admitting: Radiology

## 2011-07-27 ENCOUNTER — Other Ambulatory Visit: Payer: Self-pay | Admitting: Cardiology

## 2011-07-27 DIAGNOSIS — R011 Cardiac murmur, unspecified: Secondary | ICD-10-CM | POA: Diagnosis not present

## 2011-07-27 DIAGNOSIS — I10 Essential (primary) hypertension: Secondary | ICD-10-CM | POA: Diagnosis not present

## 2011-07-27 DIAGNOSIS — E785 Hyperlipidemia, unspecified: Secondary | ICD-10-CM | POA: Insufficient documentation

## 2011-07-27 DIAGNOSIS — I359 Nonrheumatic aortic valve disorder, unspecified: Secondary | ICD-10-CM | POA: Diagnosis not present

## 2011-07-27 DIAGNOSIS — F419 Anxiety disorder, unspecified: Secondary | ICD-10-CM

## 2011-07-27 NOTE — Telephone Encounter (Signed)
F/u   Cindy Harvey returning AL call, she can be reached at [phone].

## 2011-07-27 NOTE — Telephone Encounter (Signed)
LMTCB for Cindy Harvey  

## 2011-07-27 NOTE — Telephone Encounter (Signed)
Okay on both

## 2011-07-27 NOTE — Telephone Encounter (Signed)
New msg Amy from [facility] wants to get order for occupational therapy two times a week for six weeks

## 2011-07-27 NOTE — Progress Notes (Signed)
Echocardiogram performed.  

## 2011-07-27 NOTE — Telephone Encounter (Signed)
Spoke with Amy at [facility]. She is requesting order for pt for OT two times a week for 6 weeks. She is also requesting order for low vision program while pt is still in the cast.

## 2011-07-27 NOTE — Telephone Encounter (Signed)
Called msg to nurse to send orders and we will sign. Fax number provided.

## 2011-07-27 NOTE — Telephone Encounter (Signed)
I will forward to Dr Brackbill for review and recommendations.  

## 2011-07-28 DIAGNOSIS — H353290 Exudative age-related macular degeneration, unspecified eye, stage unspecified: Secondary | ICD-10-CM | POA: Diagnosis not present

## 2011-07-28 DIAGNOSIS — R279 Unspecified lack of coordination: Secondary | ICD-10-CM | POA: Diagnosis not present

## 2011-07-28 DIAGNOSIS — S5290XD Unspecified fracture of unspecified forearm, subsequent encounter for closed fracture with routine healing: Secondary | ICD-10-CM | POA: Diagnosis not present

## 2011-07-28 DIAGNOSIS — I10 Essential (primary) hypertension: Secondary | ICD-10-CM | POA: Diagnosis not present

## 2011-07-28 DIAGNOSIS — Z5189 Encounter for other specified aftercare: Secondary | ICD-10-CM | POA: Diagnosis not present

## 2011-07-28 DIAGNOSIS — R262 Difficulty in walking, not elsewhere classified: Secondary | ICD-10-CM | POA: Diagnosis not present

## 2011-07-28 MED ORDER — ALPRAZOLAM 0.25 MG PO TABS: 0.2500 mg | ORAL_TABLET | Freq: Two times a day (BID) | ORAL | Status: DC | PRN

## 2011-07-28 NOTE — Telephone Encounter (Signed)
New Problem:    Daughter call wanting to ask if Dr. Patty Sermons will prescribe something for her mother for anxiety. Please call back.

## 2011-07-28 NOTE — Telephone Encounter (Signed)
Refilled Xanax but advised daughter she needs to get in with Dr Milinda Antis regarding this.  Verbalized understanding

## 2011-07-29 DIAGNOSIS — S6390XA Sprain of unspecified part of unspecified wrist and hand, initial encounter: Secondary | ICD-10-CM | POA: Diagnosis not present

## 2011-07-29 DIAGNOSIS — S52599A Other fractures of lower end of unspecified radius, initial encounter for closed fracture: Secondary | ICD-10-CM | POA: Diagnosis not present

## 2011-07-30 DIAGNOSIS — Z5189 Encounter for other specified aftercare: Secondary | ICD-10-CM | POA: Diagnosis not present

## 2011-07-30 DIAGNOSIS — S5290XD Unspecified fracture of unspecified forearm, subsequent encounter for closed fracture with routine healing: Secondary | ICD-10-CM | POA: Diagnosis not present

## 2011-07-30 DIAGNOSIS — H353290 Exudative age-related macular degeneration, unspecified eye, stage unspecified: Secondary | ICD-10-CM | POA: Diagnosis not present

## 2011-07-30 DIAGNOSIS — R262 Difficulty in walking, not elsewhere classified: Secondary | ICD-10-CM | POA: Diagnosis not present

## 2011-07-30 DIAGNOSIS — I10 Essential (primary) hypertension: Secondary | ICD-10-CM | POA: Diagnosis not present

## 2011-07-30 DIAGNOSIS — R279 Unspecified lack of coordination: Secondary | ICD-10-CM | POA: Diagnosis not present

## 2011-08-02 DIAGNOSIS — I10 Essential (primary) hypertension: Secondary | ICD-10-CM | POA: Diagnosis not present

## 2011-08-02 DIAGNOSIS — Z5189 Encounter for other specified aftercare: Secondary | ICD-10-CM | POA: Diagnosis not present

## 2011-08-02 DIAGNOSIS — R262 Difficulty in walking, not elsewhere classified: Secondary | ICD-10-CM | POA: Diagnosis not present

## 2011-08-02 DIAGNOSIS — R279 Unspecified lack of coordination: Secondary | ICD-10-CM | POA: Diagnosis not present

## 2011-08-02 DIAGNOSIS — S5290XD Unspecified fracture of unspecified forearm, subsequent encounter for closed fracture with routine healing: Secondary | ICD-10-CM | POA: Diagnosis not present

## 2011-08-02 DIAGNOSIS — H353290 Exudative age-related macular degeneration, unspecified eye, stage unspecified: Secondary | ICD-10-CM | POA: Diagnosis not present

## 2011-08-04 DIAGNOSIS — R279 Unspecified lack of coordination: Secondary | ICD-10-CM | POA: Diagnosis not present

## 2011-08-04 DIAGNOSIS — S5290XD Unspecified fracture of unspecified forearm, subsequent encounter for closed fracture with routine healing: Secondary | ICD-10-CM | POA: Diagnosis not present

## 2011-08-04 DIAGNOSIS — H353290 Exudative age-related macular degeneration, unspecified eye, stage unspecified: Secondary | ICD-10-CM | POA: Diagnosis not present

## 2011-08-04 DIAGNOSIS — R262 Difficulty in walking, not elsewhere classified: Secondary | ICD-10-CM | POA: Diagnosis not present

## 2011-08-04 DIAGNOSIS — Z5189 Encounter for other specified aftercare: Secondary | ICD-10-CM | POA: Diagnosis not present

## 2011-08-04 DIAGNOSIS — I10 Essential (primary) hypertension: Secondary | ICD-10-CM | POA: Diagnosis not present

## 2011-08-09 DIAGNOSIS — S5290XD Unspecified fracture of unspecified forearm, subsequent encounter for closed fracture with routine healing: Secondary | ICD-10-CM | POA: Diagnosis not present

## 2011-08-09 DIAGNOSIS — Z5189 Encounter for other specified aftercare: Secondary | ICD-10-CM | POA: Diagnosis not present

## 2011-08-09 DIAGNOSIS — R279 Unspecified lack of coordination: Secondary | ICD-10-CM | POA: Diagnosis not present

## 2011-08-09 DIAGNOSIS — H353290 Exudative age-related macular degeneration, unspecified eye, stage unspecified: Secondary | ICD-10-CM | POA: Diagnosis not present

## 2011-08-09 DIAGNOSIS — R262 Difficulty in walking, not elsewhere classified: Secondary | ICD-10-CM | POA: Diagnosis not present

## 2011-08-09 DIAGNOSIS — I10 Essential (primary) hypertension: Secondary | ICD-10-CM | POA: Diagnosis not present

## 2011-08-11 DIAGNOSIS — Z5189 Encounter for other specified aftercare: Secondary | ICD-10-CM | POA: Diagnosis not present

## 2011-08-11 DIAGNOSIS — R262 Difficulty in walking, not elsewhere classified: Secondary | ICD-10-CM | POA: Diagnosis not present

## 2011-08-11 DIAGNOSIS — H353290 Exudative age-related macular degeneration, unspecified eye, stage unspecified: Secondary | ICD-10-CM | POA: Diagnosis not present

## 2011-08-11 DIAGNOSIS — S5290XD Unspecified fracture of unspecified forearm, subsequent encounter for closed fracture with routine healing: Secondary | ICD-10-CM | POA: Diagnosis not present

## 2011-08-11 DIAGNOSIS — I10 Essential (primary) hypertension: Secondary | ICD-10-CM | POA: Diagnosis not present

## 2011-08-11 DIAGNOSIS — R279 Unspecified lack of coordination: Secondary | ICD-10-CM | POA: Diagnosis not present

## 2011-08-13 DIAGNOSIS — S6390XA Sprain of unspecified part of unspecified wrist and hand, initial encounter: Secondary | ICD-10-CM | POA: Diagnosis not present

## 2011-08-16 DIAGNOSIS — H353290 Exudative age-related macular degeneration, unspecified eye, stage unspecified: Secondary | ICD-10-CM | POA: Diagnosis not present

## 2011-08-16 DIAGNOSIS — R262 Difficulty in walking, not elsewhere classified: Secondary | ICD-10-CM | POA: Diagnosis not present

## 2011-08-16 DIAGNOSIS — I10 Essential (primary) hypertension: Secondary | ICD-10-CM | POA: Diagnosis not present

## 2011-08-16 DIAGNOSIS — R279 Unspecified lack of coordination: Secondary | ICD-10-CM | POA: Diagnosis not present

## 2011-08-16 DIAGNOSIS — S5290XD Unspecified fracture of unspecified forearm, subsequent encounter for closed fracture with routine healing: Secondary | ICD-10-CM | POA: Diagnosis not present

## 2011-08-16 DIAGNOSIS — Z5189 Encounter for other specified aftercare: Secondary | ICD-10-CM | POA: Diagnosis not present

## 2011-08-18 DIAGNOSIS — Z5189 Encounter for other specified aftercare: Secondary | ICD-10-CM | POA: Diagnosis not present

## 2011-08-18 DIAGNOSIS — R279 Unspecified lack of coordination: Secondary | ICD-10-CM | POA: Diagnosis not present

## 2011-08-18 DIAGNOSIS — R262 Difficulty in walking, not elsewhere classified: Secondary | ICD-10-CM | POA: Diagnosis not present

## 2011-08-18 DIAGNOSIS — H353290 Exudative age-related macular degeneration, unspecified eye, stage unspecified: Secondary | ICD-10-CM | POA: Diagnosis not present

## 2011-08-18 DIAGNOSIS — I10 Essential (primary) hypertension: Secondary | ICD-10-CM | POA: Diagnosis not present

## 2011-08-18 DIAGNOSIS — S5290XD Unspecified fracture of unspecified forearm, subsequent encounter for closed fracture with routine healing: Secondary | ICD-10-CM | POA: Diagnosis not present

## 2011-08-23 DIAGNOSIS — Z5189 Encounter for other specified aftercare: Secondary | ICD-10-CM | POA: Diagnosis not present

## 2011-08-23 DIAGNOSIS — S5290XD Unspecified fracture of unspecified forearm, subsequent encounter for closed fracture with routine healing: Secondary | ICD-10-CM | POA: Diagnosis not present

## 2011-08-23 DIAGNOSIS — I10 Essential (primary) hypertension: Secondary | ICD-10-CM | POA: Diagnosis not present

## 2011-08-23 DIAGNOSIS — R279 Unspecified lack of coordination: Secondary | ICD-10-CM | POA: Diagnosis not present

## 2011-08-23 DIAGNOSIS — H353290 Exudative age-related macular degeneration, unspecified eye, stage unspecified: Secondary | ICD-10-CM | POA: Diagnosis not present

## 2011-08-23 DIAGNOSIS — R262 Difficulty in walking, not elsewhere classified: Secondary | ICD-10-CM | POA: Diagnosis not present

## 2011-08-25 DIAGNOSIS — Z5189 Encounter for other specified aftercare: Secondary | ICD-10-CM | POA: Diagnosis not present

## 2011-08-25 DIAGNOSIS — R262 Difficulty in walking, not elsewhere classified: Secondary | ICD-10-CM | POA: Diagnosis not present

## 2011-08-25 DIAGNOSIS — S5290XD Unspecified fracture of unspecified forearm, subsequent encounter for closed fracture with routine healing: Secondary | ICD-10-CM | POA: Diagnosis not present

## 2011-08-25 DIAGNOSIS — H353290 Exudative age-related macular degeneration, unspecified eye, stage unspecified: Secondary | ICD-10-CM | POA: Diagnosis not present

## 2011-08-25 DIAGNOSIS — R279 Unspecified lack of coordination: Secondary | ICD-10-CM | POA: Diagnosis not present

## 2011-08-25 DIAGNOSIS — I10 Essential (primary) hypertension: Secondary | ICD-10-CM | POA: Diagnosis not present

## 2011-08-30 DIAGNOSIS — Z5189 Encounter for other specified aftercare: Secondary | ICD-10-CM | POA: Diagnosis not present

## 2011-08-30 DIAGNOSIS — I10 Essential (primary) hypertension: Secondary | ICD-10-CM | POA: Diagnosis not present

## 2011-08-30 DIAGNOSIS — H353290 Exudative age-related macular degeneration, unspecified eye, stage unspecified: Secondary | ICD-10-CM | POA: Diagnosis not present

## 2011-08-30 DIAGNOSIS — R279 Unspecified lack of coordination: Secondary | ICD-10-CM | POA: Diagnosis not present

## 2011-08-30 DIAGNOSIS — R262 Difficulty in walking, not elsewhere classified: Secondary | ICD-10-CM | POA: Diagnosis not present

## 2011-08-30 DIAGNOSIS — S5290XD Unspecified fracture of unspecified forearm, subsequent encounter for closed fracture with routine healing: Secondary | ICD-10-CM | POA: Diagnosis not present

## 2011-09-01 ENCOUNTER — Telehealth: Payer: Self-pay | Admitting: Cardiology

## 2011-09-01 DIAGNOSIS — H353290 Exudative age-related macular degeneration, unspecified eye, stage unspecified: Secondary | ICD-10-CM | POA: Diagnosis not present

## 2011-09-01 DIAGNOSIS — I10 Essential (primary) hypertension: Secondary | ICD-10-CM | POA: Diagnosis not present

## 2011-09-01 DIAGNOSIS — R262 Difficulty in walking, not elsewhere classified: Secondary | ICD-10-CM | POA: Diagnosis not present

## 2011-09-01 DIAGNOSIS — S6390XA Sprain of unspecified part of unspecified wrist and hand, initial encounter: Secondary | ICD-10-CM | POA: Diagnosis not present

## 2011-09-01 DIAGNOSIS — S5290XD Unspecified fracture of unspecified forearm, subsequent encounter for closed fracture with routine healing: Secondary | ICD-10-CM | POA: Diagnosis not present

## 2011-09-01 DIAGNOSIS — Z5189 Encounter for other specified aftercare: Secondary | ICD-10-CM | POA: Diagnosis not present

## 2011-09-01 DIAGNOSIS — R279 Unspecified lack of coordination: Secondary | ICD-10-CM | POA: Diagnosis not present

## 2011-09-01 NOTE — Telephone Encounter (Signed)
Advised ok to continue another week

## 2011-09-01 NOTE — Telephone Encounter (Signed)
New msg Amy with [facility] wants occupational therapy orders to continue for two times a week. Please call

## 2011-09-01 NOTE — Telephone Encounter (Signed)
Left message to call back  

## 2011-09-02 ENCOUNTER — Ambulatory Visit
Admission: RE | Admit: 2011-09-02 | Discharge: 2011-09-02 | Disposition: A | Discharge status: Home / Self Care | Payer: Medicare Other | Source: Ambulatory Visit | Attending: Orthopedic Surgery | Admitting: Orthopedic Surgery

## 2011-09-02 ENCOUNTER — Other Ambulatory Visit: Payer: Self-pay | Admitting: Orthopedic Surgery

## 2011-09-02 DIAGNOSIS — F070 Personality change due to known physiological condition: Secondary | ICD-10-CM

## 2011-09-02 DIAGNOSIS — S52509A Unspecified fracture of the lower end of unspecified radius, initial encounter for closed fracture: Secondary | ICD-10-CM

## 2011-09-02 DIAGNOSIS — R4182 Altered mental status, unspecified: Secondary | ICD-10-CM | POA: Diagnosis not present

## 2011-09-06 DIAGNOSIS — R262 Difficulty in walking, not elsewhere classified: Secondary | ICD-10-CM | POA: Diagnosis not present

## 2011-09-06 DIAGNOSIS — Z5189 Encounter for other specified aftercare: Secondary | ICD-10-CM | POA: Diagnosis not present

## 2011-09-06 DIAGNOSIS — S5290XD Unspecified fracture of unspecified forearm, subsequent encounter for closed fracture with routine healing: Secondary | ICD-10-CM | POA: Diagnosis not present

## 2011-09-06 DIAGNOSIS — H353290 Exudative age-related macular degeneration, unspecified eye, stage unspecified: Secondary | ICD-10-CM | POA: Diagnosis not present

## 2011-09-06 DIAGNOSIS — I10 Essential (primary) hypertension: Secondary | ICD-10-CM | POA: Diagnosis not present

## 2011-09-06 DIAGNOSIS — R279 Unspecified lack of coordination: Secondary | ICD-10-CM | POA: Diagnosis not present

## 2011-09-08 ENCOUNTER — Telehealth: Payer: Self-pay | Admitting: Cardiology

## 2011-09-08 DIAGNOSIS — S5290XD Unspecified fracture of unspecified forearm, subsequent encounter for closed fracture with routine healing: Secondary | ICD-10-CM | POA: Diagnosis not present

## 2011-09-08 DIAGNOSIS — Z5189 Encounter for other specified aftercare: Secondary | ICD-10-CM | POA: Diagnosis not present

## 2011-09-08 DIAGNOSIS — I10 Essential (primary) hypertension: Secondary | ICD-10-CM | POA: Diagnosis not present

## 2011-09-08 DIAGNOSIS — H353290 Exudative age-related macular degeneration, unspecified eye, stage unspecified: Secondary | ICD-10-CM | POA: Diagnosis not present

## 2011-09-08 DIAGNOSIS — R279 Unspecified lack of coordination: Secondary | ICD-10-CM | POA: Diagnosis not present

## 2011-09-08 DIAGNOSIS — R262 Difficulty in walking, not elsewhere classified: Secondary | ICD-10-CM | POA: Diagnosis not present

## 2011-09-08 NOTE — Telephone Encounter (Signed)
T/O ok to continue 

## 2011-09-08 NOTE — Telephone Encounter (Signed)
New problem;  Per Amy need an order to continue OT  for 2 x weeks x time 3 weeks. Starts next week.

## 2011-09-10 DIAGNOSIS — H353290 Exudative age-related macular degeneration, unspecified eye, stage unspecified: Secondary | ICD-10-CM | POA: Diagnosis not present

## 2011-09-10 DIAGNOSIS — IMO0001 Reserved for inherently not codable concepts without codable children: Secondary | ICD-10-CM | POA: Diagnosis not present

## 2011-09-10 DIAGNOSIS — I10 Essential (primary) hypertension: Secondary | ICD-10-CM | POA: Diagnosis not present

## 2011-09-10 DIAGNOSIS — R262 Difficulty in walking, not elsewhere classified: Secondary | ICD-10-CM | POA: Diagnosis not present

## 2011-09-10 DIAGNOSIS — H209 Unspecified iridocyclitis: Secondary | ICD-10-CM | POA: Diagnosis not present

## 2011-09-10 DIAGNOSIS — R279 Unspecified lack of coordination: Secondary | ICD-10-CM | POA: Diagnosis not present

## 2011-09-10 DIAGNOSIS — S5290XD Unspecified fracture of unspecified forearm, subsequent encounter for closed fracture with routine healing: Secondary | ICD-10-CM | POA: Diagnosis not present

## 2011-09-10 DIAGNOSIS — Z9181 History of falling: Secondary | ICD-10-CM | POA: Diagnosis not present

## 2011-09-15 DIAGNOSIS — S5290XD Unspecified fracture of unspecified forearm, subsequent encounter for closed fracture with routine healing: Secondary | ICD-10-CM | POA: Diagnosis not present

## 2011-09-15 DIAGNOSIS — R279 Unspecified lack of coordination: Secondary | ICD-10-CM | POA: Diagnosis not present

## 2011-09-15 DIAGNOSIS — R262 Difficulty in walking, not elsewhere classified: Secondary | ICD-10-CM | POA: Diagnosis not present

## 2011-09-15 DIAGNOSIS — H209 Unspecified iridocyclitis: Secondary | ICD-10-CM | POA: Diagnosis not present

## 2011-09-15 DIAGNOSIS — IMO0001 Reserved for inherently not codable concepts without codable children: Secondary | ICD-10-CM | POA: Diagnosis not present

## 2011-09-15 DIAGNOSIS — H353290 Exudative age-related macular degeneration, unspecified eye, stage unspecified: Secondary | ICD-10-CM | POA: Diagnosis not present

## 2011-09-17 DIAGNOSIS — H4040X0 Glaucoma secondary to eye inflammation, unspecified eye, stage unspecified: Secondary | ICD-10-CM | POA: Diagnosis not present

## 2011-09-17 DIAGNOSIS — H353290 Exudative age-related macular degeneration, unspecified eye, stage unspecified: Secondary | ICD-10-CM | POA: Diagnosis not present

## 2011-09-17 DIAGNOSIS — IMO0001 Reserved for inherently not codable concepts without codable children: Secondary | ICD-10-CM | POA: Diagnosis not present

## 2011-09-17 DIAGNOSIS — S5290XD Unspecified fracture of unspecified forearm, subsequent encounter for closed fracture with routine healing: Secondary | ICD-10-CM | POA: Diagnosis not present

## 2011-09-17 DIAGNOSIS — R279 Unspecified lack of coordination: Secondary | ICD-10-CM | POA: Diagnosis not present

## 2011-09-17 DIAGNOSIS — R262 Difficulty in walking, not elsewhere classified: Secondary | ICD-10-CM | POA: Diagnosis not present

## 2011-09-20 DIAGNOSIS — S5290XD Unspecified fracture of unspecified forearm, subsequent encounter for closed fracture with routine healing: Secondary | ICD-10-CM | POA: Diagnosis not present

## 2011-09-20 DIAGNOSIS — H209 Unspecified iridocyclitis: Secondary | ICD-10-CM | POA: Diagnosis not present

## 2011-09-20 DIAGNOSIS — R279 Unspecified lack of coordination: Secondary | ICD-10-CM | POA: Diagnosis not present

## 2011-09-20 DIAGNOSIS — R262 Difficulty in walking, not elsewhere classified: Secondary | ICD-10-CM | POA: Diagnosis not present

## 2011-09-20 DIAGNOSIS — H353290 Exudative age-related macular degeneration, unspecified eye, stage unspecified: Secondary | ICD-10-CM | POA: Diagnosis not present

## 2011-09-20 DIAGNOSIS — IMO0001 Reserved for inherently not codable concepts without codable children: Secondary | ICD-10-CM | POA: Diagnosis not present

## 2011-09-22 DIAGNOSIS — IMO0001 Reserved for inherently not codable concepts without codable children: Secondary | ICD-10-CM | POA: Diagnosis not present

## 2011-09-22 DIAGNOSIS — R262 Difficulty in walking, not elsewhere classified: Secondary | ICD-10-CM | POA: Diagnosis not present

## 2011-09-22 DIAGNOSIS — S5290XD Unspecified fracture of unspecified forearm, subsequent encounter for closed fracture with routine healing: Secondary | ICD-10-CM | POA: Diagnosis not present

## 2011-09-22 DIAGNOSIS — H353290 Exudative age-related macular degeneration, unspecified eye, stage unspecified: Secondary | ICD-10-CM | POA: Diagnosis not present

## 2011-09-22 DIAGNOSIS — R279 Unspecified lack of coordination: Secondary | ICD-10-CM | POA: Diagnosis not present

## 2011-09-22 DIAGNOSIS — H4040X0 Glaucoma secondary to eye inflammation, unspecified eye, stage unspecified: Secondary | ICD-10-CM | POA: Diagnosis not present

## 2011-09-27 DIAGNOSIS — H353290 Exudative age-related macular degeneration, unspecified eye, stage unspecified: Secondary | ICD-10-CM | POA: Diagnosis not present

## 2011-09-27 DIAGNOSIS — R279 Unspecified lack of coordination: Secondary | ICD-10-CM | POA: Diagnosis not present

## 2011-09-27 DIAGNOSIS — IMO0001 Reserved for inherently not codable concepts without codable children: Secondary | ICD-10-CM | POA: Diagnosis not present

## 2011-09-27 DIAGNOSIS — S5290XD Unspecified fracture of unspecified forearm, subsequent encounter for closed fracture with routine healing: Secondary | ICD-10-CM | POA: Diagnosis not present

## 2011-09-27 DIAGNOSIS — H4040X0 Glaucoma secondary to eye inflammation, unspecified eye, stage unspecified: Secondary | ICD-10-CM | POA: Diagnosis not present

## 2011-09-27 DIAGNOSIS — R262 Difficulty in walking, not elsewhere classified: Secondary | ICD-10-CM | POA: Diagnosis not present

## 2011-09-29 DIAGNOSIS — IMO0001 Reserved for inherently not codable concepts without codable children: Secondary | ICD-10-CM | POA: Diagnosis not present

## 2011-09-29 DIAGNOSIS — R279 Unspecified lack of coordination: Secondary | ICD-10-CM | POA: Diagnosis not present

## 2011-09-29 DIAGNOSIS — H353290 Exudative age-related macular degeneration, unspecified eye, stage unspecified: Secondary | ICD-10-CM | POA: Diagnosis not present

## 2011-09-29 DIAGNOSIS — H209 Unspecified iridocyclitis: Secondary | ICD-10-CM | POA: Diagnosis not present

## 2011-09-29 DIAGNOSIS — R262 Difficulty in walking, not elsewhere classified: Secondary | ICD-10-CM | POA: Diagnosis not present

## 2011-09-29 DIAGNOSIS — S5290XD Unspecified fracture of unspecified forearm, subsequent encounter for closed fracture with routine healing: Secondary | ICD-10-CM | POA: Diagnosis not present

## 2011-10-22 ENCOUNTER — Other Ambulatory Visit: Payer: Self-pay | Admitting: Cardiology

## 2011-10-22 NOTE — Telephone Encounter (Signed)
Refilled amlodipine and lisinopril

## 2011-10-26 DIAGNOSIS — Z23 Encounter for immunization: Secondary | ICD-10-CM | POA: Diagnosis not present

## 2011-11-09 DIAGNOSIS — H35359 Cystoid macular degeneration, unspecified eye: Secondary | ICD-10-CM | POA: Diagnosis not present

## 2011-11-09 DIAGNOSIS — H35059 Retinal neovascularization, unspecified, unspecified eye: Secondary | ICD-10-CM | POA: Diagnosis not present

## 2011-11-09 DIAGNOSIS — H3530 Unspecified macular degeneration: Secondary | ICD-10-CM | POA: Diagnosis not present

## 2011-11-11 ENCOUNTER — Telehealth: Payer: Self-pay | Admitting: Cardiology

## 2011-11-11 DIAGNOSIS — E785 Hyperlipidemia, unspecified: Secondary | ICD-10-CM

## 2011-11-11 NOTE — Telephone Encounter (Signed)
New problem:  Need lab work put into the system on 12/3  

## 2011-11-23 ENCOUNTER — Other Ambulatory Visit: Payer: Medicare Other

## 2011-12-24 ENCOUNTER — Other Ambulatory Visit: Payer: Medicare Other

## 2012-01-25 ENCOUNTER — Ambulatory Visit (INDEPENDENT_AMBULATORY_CARE_PROVIDER_SITE_OTHER): Payer: Medicare Other | Admitting: Cardiology

## 2012-01-25 ENCOUNTER — Other Ambulatory Visit (INDEPENDENT_AMBULATORY_CARE_PROVIDER_SITE_OTHER): Payer: Medicare Other

## 2012-01-25 ENCOUNTER — Encounter: Payer: Self-pay | Admitting: Cardiology

## 2012-01-25 VITALS — BP 137/63 | HR 75 | Ht 62.0 in | Wt 131.0 lb

## 2012-01-25 DIAGNOSIS — E7800 Pure hypercholesterolemia, unspecified: Secondary | ICD-10-CM | POA: Diagnosis not present

## 2012-01-25 DIAGNOSIS — I119 Hypertensive heart disease without heart failure: Secondary | ICD-10-CM

## 2012-01-25 DIAGNOSIS — E785 Hyperlipidemia, unspecified: Secondary | ICD-10-CM | POA: Diagnosis not present

## 2012-01-25 DIAGNOSIS — I10 Essential (primary) hypertension: Secondary | ICD-10-CM

## 2012-01-25 LAB — LDL CHOLESTEROL, DIRECT: Direct LDL: 139.8 mg/dL

## 2012-01-25 LAB — HEPATIC FUNCTION PANEL
ALT: 14 U/L (ref 0–35)
Albumin: 4.1 g/dL (ref 3.5–5.2)
Alkaline Phosphatase: 57 U/L (ref 39–117)
Bilirubin, Direct: 0.1 mg/dL (ref 0.0–0.3)
Total Protein: 6.9 g/dL (ref 6.0–8.3)

## 2012-01-25 LAB — LIPID PANEL
Cholesterol: 216 mg/dL — ABNORMAL HIGH (ref 0–200)
HDL: 52 mg/dL (ref >39.00)
VLDL: 27.4 mg/dL (ref 0.0–40.0)

## 2012-01-25 LAB — BASIC METABOLIC PANEL
BUN: 36 mg/dL — ABNORMAL HIGH (ref 6–23)
Chloride: 98 mEq/L (ref 96–112)
Creatinine, Ser: 1.9 mg/dL — ABNORMAL HIGH (ref 0.4–1.2)
GFR: 26.38 mL/min — ABNORMAL LOW (ref >60.00)
Potassium: 4 mEq/L (ref 3.5–5.1)

## 2012-01-25 NOTE — Patient Instructions (Addendum)
Your physician recommends that you continue on your current medications as directed. Please refer to the Current Medication list given to you today.  Your physician wants you to follow-up in: 6 months with fasting labs (lp/bmet/hfp)  You will receive a reminder letter in the mail two months in advance. If you don't receive a letter, please call our office to schedule the follow-up appointment.  Will obtain labs today and call you with the results (lp/bmet/hfp)  

## 2012-01-25 NOTE — Progress Notes (Signed)
Cindy Harvey Date of Birth:  May 31, 1916 [facility] [address] [place], [place]  [address] [phone]  Fax   [phone]  HPI: This pleasant 78 year old widowed woman in for a scheduled followup office visit.  She has a history of high blood pressure and high cholesterol. She does not have any history of ischemic heart disease. She has had some problems with poor balance.  She has been feeling well with no new complaints today.  She has a known mild heart murmur and had an echocardiogram 07/27/11 showed an ejection fraction of 55-60% and a mild aortic stenosis with mild aortic insufficiency.   Current Outpatient Prescriptions  Medication Sig Dispense Refill  . acetaminophen (TYLENOL) 500 MG tablet OTC as directed.       Marland Kitchen amLODipine (NORVASC) 5 MG tablet TAKE 1 TABLET (5 MG TOTAL) BY MOUTH DAILY.  30 tablet  11  . Calcium Carbonate-Vitamin D (CALCIUM 600+D) 600-200 MG-UNIT TABS Take 2 tablets by mouth daily.       . fexofenadine (ALLEGRA) 60 MG tablet Take 60 mg by mouth daily.      Marland Kitchen guaiFENesin (MUCINEX) 600 MG 12 hr tablet Take 600 mg by mouth as needed.        Marland Kitchen lisinopril (PRINIVIL,ZESTRIL) 20 MG tablet TAKE 1 TABLET (20 MG TOTAL) BY MOUTH DAILY.  30 tablet  11  . Multiple Vitamins-Minerals (OCUVITE PO) Take by mouth daily.        . multivitamin (THERAGRAN) per tablet Take 1 tablet by mouth daily.        . niacin (NIASPAN) 500 MG CR tablet Take 2 tablets (1,000 mg total) by mouth at bedtime.  60 tablet  12  . Nutritional Supplements (ENSURE PO) Take by mouth at bedtime.       . promethazine (PHENERGAN) 25 MG tablet Take 25 mg by mouth as needed. For nausea        . ZETIA 10 MG tablet TAKE 1 TABLET BY MOUTH EVERY DAY  30 tablet  11  . [DISCONTINUED] lisinopril (PRINIVIL,ZESTRIL) 20 MG tablet Take 20 mg by mouth daily.      Marland Kitchen ALPRAZolam (XANAX) 0.25 MG tablet Take 1 tablet (0.25 mg total) by mouth 2 (two) times daily as needed.  60 tablet  0    Allergies    Allergen Reactions  . Asa Buff (Mag (Buffered Aspirin)     Nose bleeds  . Lipitor (Atorvastatin Calcium)     Headaches & dizzy  . Zocor (Simvastatin - High Dose)     myalgia    Patient Active Problem List  Diagnosis  . HYPERLIPIDEMIA  . HYPERTENSION  . ALLERGIC RHINITIS  . OSTEOARTHRITIS, KNEE  . Depression (emotion)  . Hordeolum  . Post-menopausal  . Osteoporosis screening  . Heart murmur    History  Smoking status  . Never Smoker   Smokeless tobacco  . Not on file    History  Alcohol Use No    Family History  Problem Relation Age of Onset  . Heart disease Mother   . Stroke Mother   . Stroke Sister   . Cancer Maternal Aunt     breast CA  . Cancer Cousin     breast cancer    Review of Systems: The patient denies any heat or cold intolerance.  No weight gain or weight loss.  The patient denies headaches or blurry vision.  There is no cough or sputum production.  The patient denies dizziness.  There  is no hematuria or hematochezia.  The patient denies any muscle aches or arthritis.  The patient denies any rash.  The patient denies frequent falling or instability.  There is no history of depression or anxiety.  All other systems were reviewed and are negative.   Physical Exam: Filed Vitals:   01/25/12 0940  BP: 137/63  Pulse: 75   the general appearance reveals a well-developed elderly alert woman in no distress.The head and neck exam reveals pupils equal and reactive.  Extraocular movements are full.  There is no scleral icterus.  The mouth and pharynx are normal.  The neck is supple.  The carotids reveal no bruits.  The jugular venous pressure is normal.  The  thyroid is not enlarged.  There is no lymphadenopathy.  The chest is clear to percussion and auscultation.  There are no rales or rhonchi.  Expansion of the chest is symmetrical.  The precordium is quiet.  The first heart sound is normal.  The second heart sound is physiologically split.  There is no murmur  gallop rub or click.  There is no abnormal lift or heave.  The abdomen is soft and nontender.  The bowel sounds are normal.  The liver and spleen are not enlarged.  There are no abdominal masses.  There are no abdominal bruits.  Extremities reveal good pedal pulses.  There is no phlebitis or edema.  There is no cyanosis or clubbing.  Strength is normal and symmetrical in all extremities.  There is no lateralizing weakness.  There are no sensory deficits.  The skin is warm and dry.  There is no rash.  EKG shows normal sinus rhythm and is within normal limits.    Assessment / Plan:  Continue same medication.  Recheck in 6 months for followup office visit lipid panel hepatic function panel and basal metabolic panel.

## 2012-01-25 NOTE — Assessment & Plan Note (Signed)
The patient has a history of hyperlipidemia.  She is intolerant of statins.  She is on Niaspan and ezetimibe.  She has not been experiencing any myalgias or side effects from the medication.  Her daughter states that the patient is not compliant with her diet and she eats a lot of french fries and fast foods.

## 2012-01-25 NOTE — Assessment & Plan Note (Signed)
The patient has a history of high blood pressure.  She is not having any dizziness or shortness of breath or chest pain or syncope.  Is not having any adverse side effects from the amlodipine or lisinopril.

## 2012-01-27 ENCOUNTER — Telehealth: Payer: Self-pay | Admitting: *Deleted

## 2012-01-27 NOTE — Telephone Encounter (Signed)
Advised patient of lab results  

## 2012-01-27 NOTE — Telephone Encounter (Signed)
Message copied by Burnell Blanks on Thu Jan 27, 2012  4:46 PM ------      Message from: Cassell Clement      Created: Tue Jan 25, 2012  5:09 PM       Please report.  The cholesterol is higher at 216 and the LDL is higher at 139.  She needs to do better with her diet and avoid fast foods and french fries etc. liver tests are normal.  The kidney function is not quite as good and she needs to drink more water.  She should also avoid taking too many Tylenol--- would not exceed 3  500 mg tablets a day in her age group

## 2012-05-12 IMAGING — CT CT HEAD W/O CM
3 of 4 series · 16 of 30 positions shown, 19 images · non-contrast
Comparison: None.

CT HEAD

CLINICAL DATA: Pain after fall

CT HEAD WITHOUT CONTRAST
CT MAXILLOFACIAL WITHOUT CONTRAST
TECHNIQUE: Multidetector CT imaging of the head and maxillofacial
structures were performed using the standard protocol without
intravenous contrast. Multiplanar CT image reconstructions of the
maxillofacial structures were also generated.

[Series 3: head w/o · axial · non-contrast · 0.45mm/px · z∈[+1169,+1214]mm · 2 of 28 slices shown]
[im 10/28  brain]
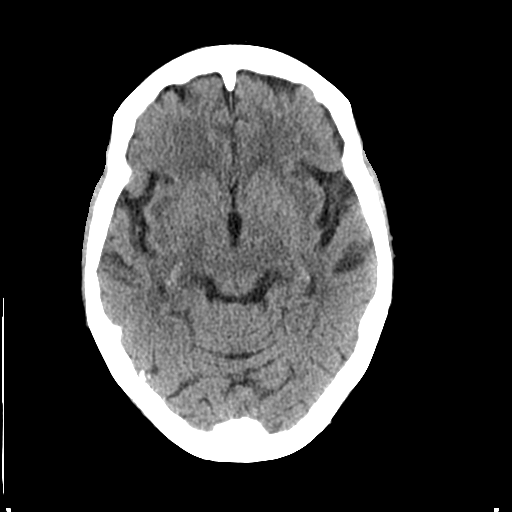
[im 19/28  brain]
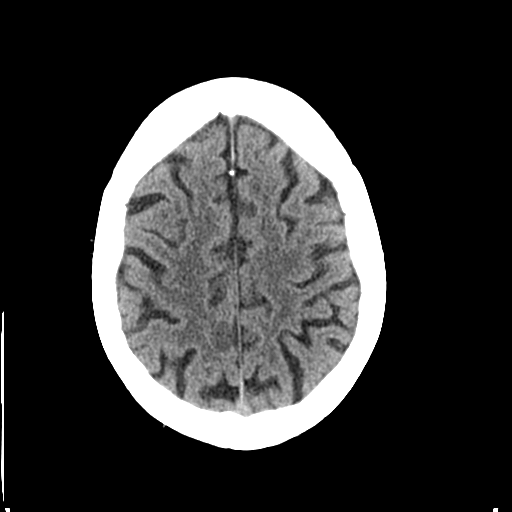

[Series 4: facial st · axial · 0.33mm/px · z∈[+1041,+1153]mm · 9 of 70 slices shown, 12 images]
[im 7/70  brain]
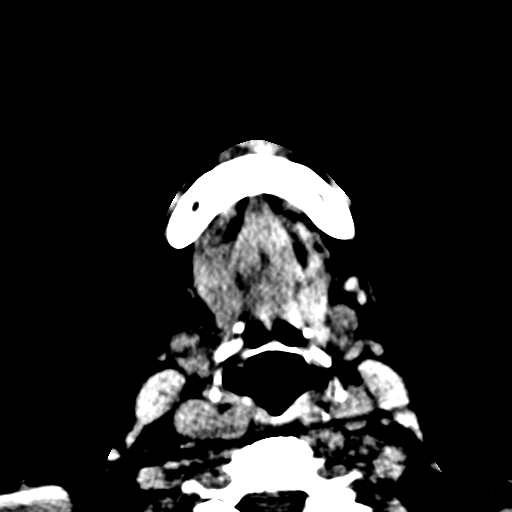
[im 7/70  bone]
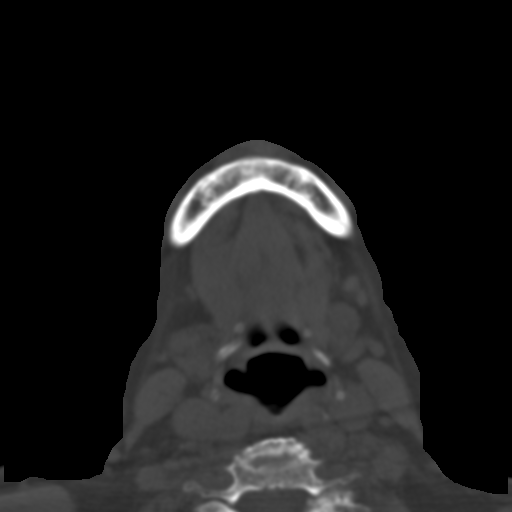
[im 14/70  brain]
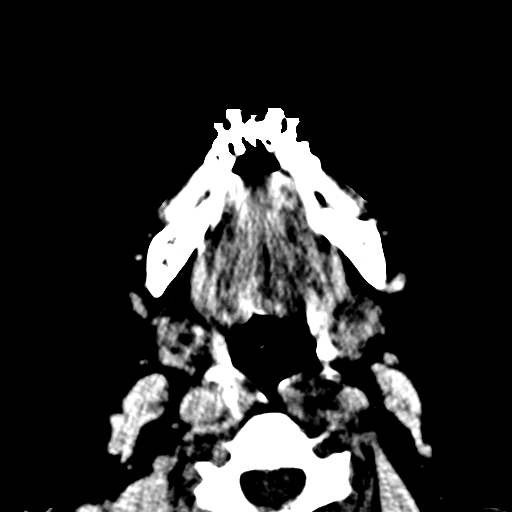
[im 21/70  brain]
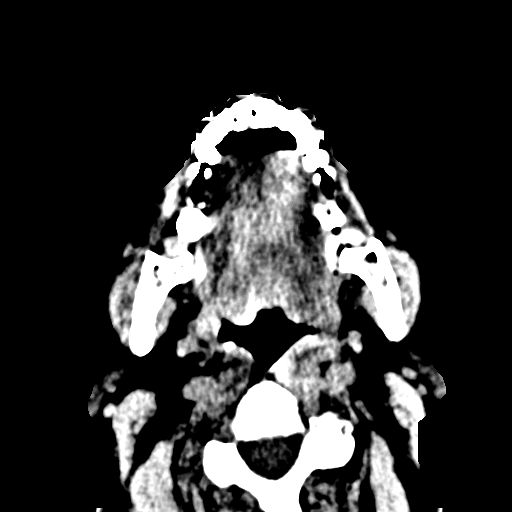
[im 28/70  brain]
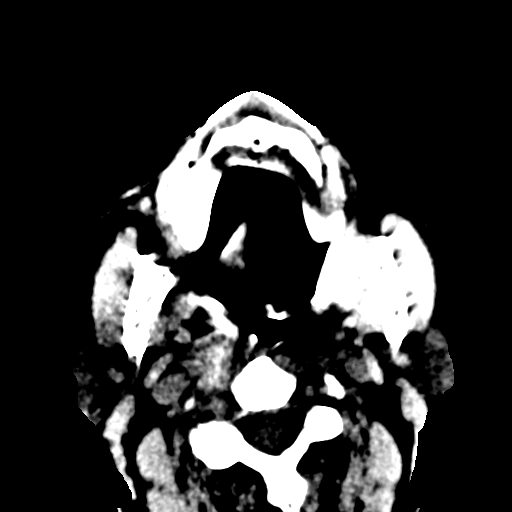
[im 35/70  brain]
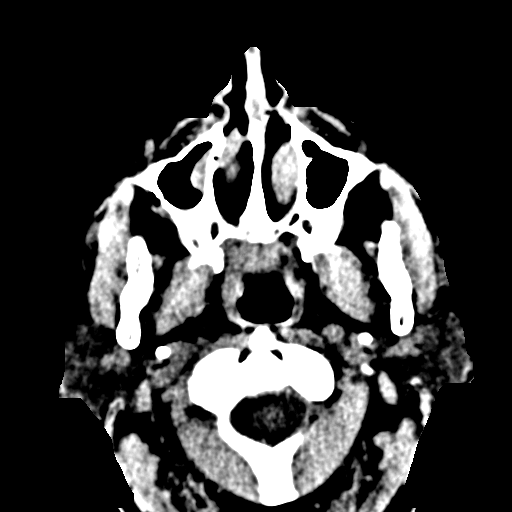
[im 35/70  bone]
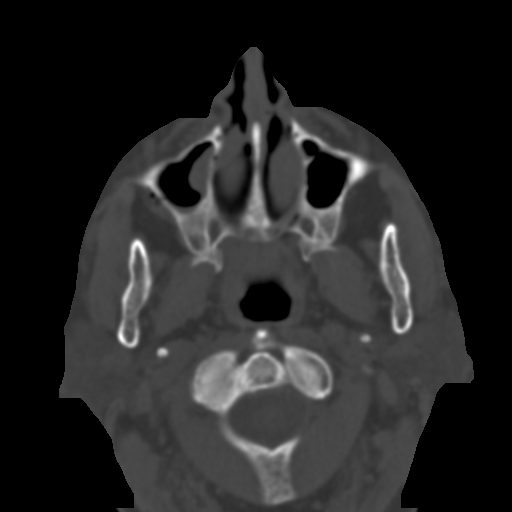
[im 42/70  brain]
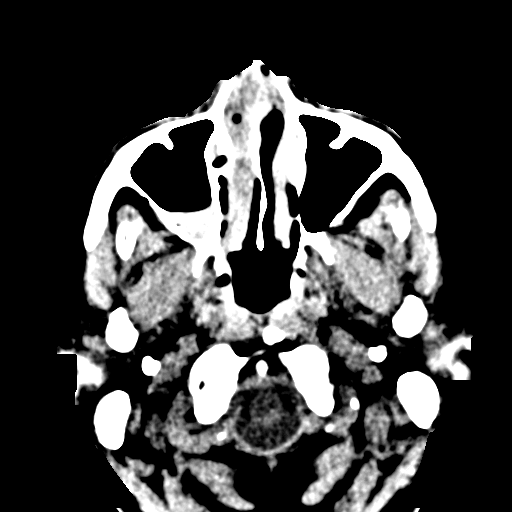
[im 49/70  brain]
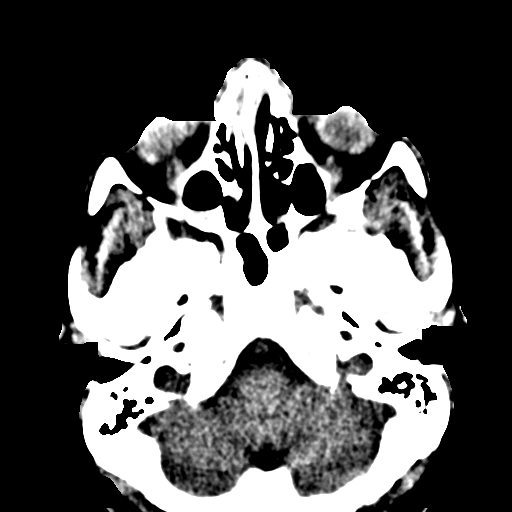
[im 56/70  brain]
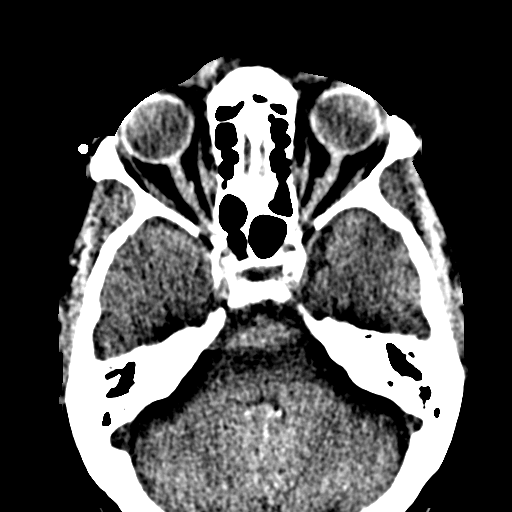
[im 63/70  brain]
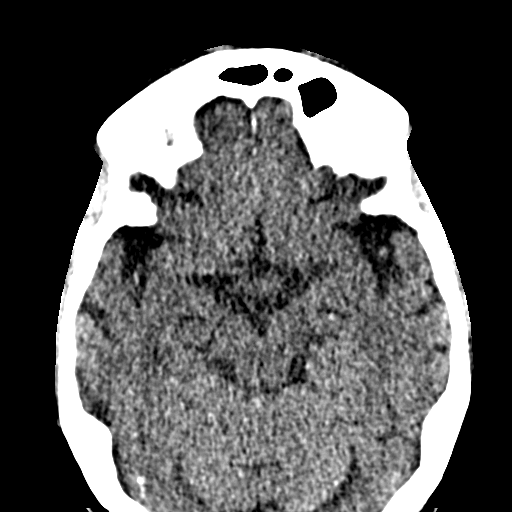
[im 63/70  bone]
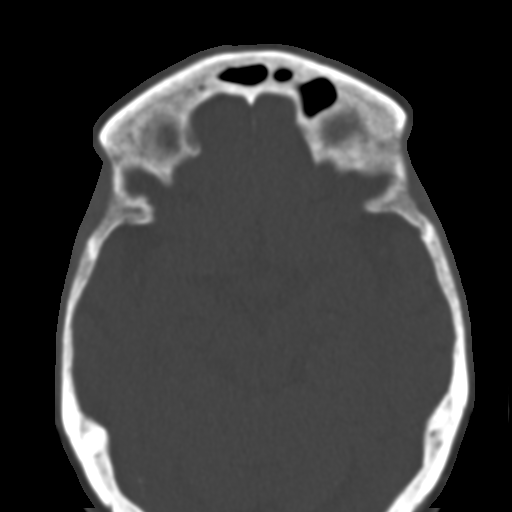

[Series 6: bone windows · axial · 0.45mm/px · z∈[+1142,+1220]mm · 5 of 46 slices shown]
[im 7/46  bone]
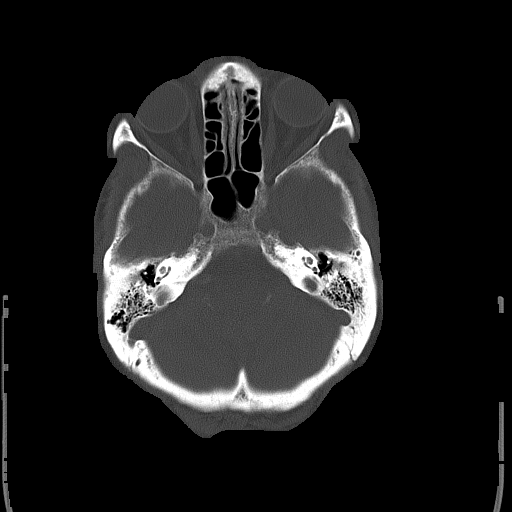
[im 13/46  bone]
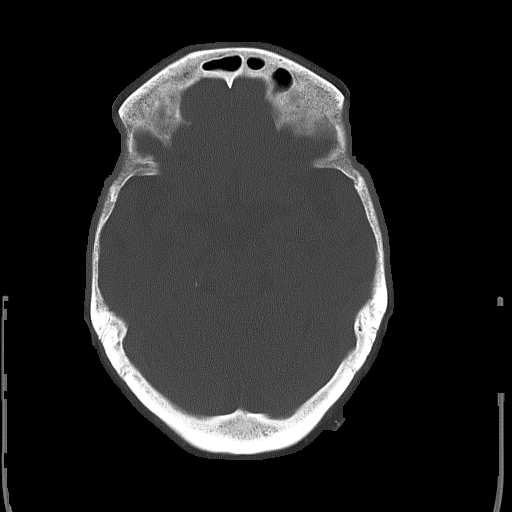
[im 20/46  bone]
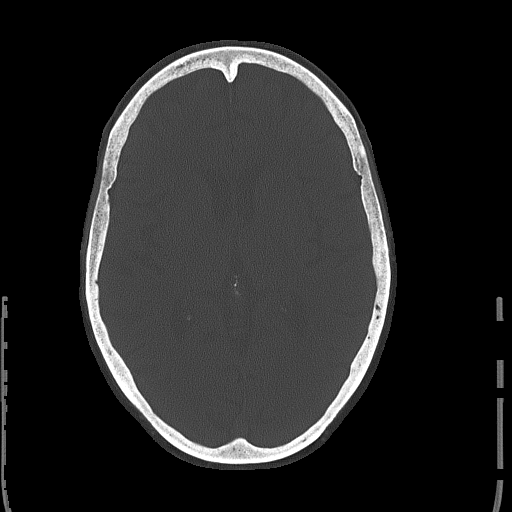
[im 26/46  bone]
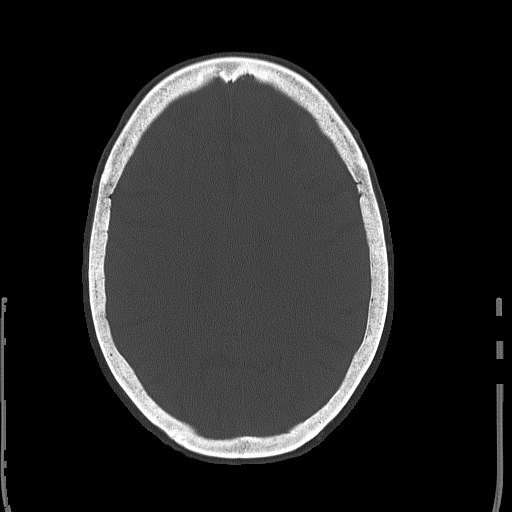
[im 33/46  bone]
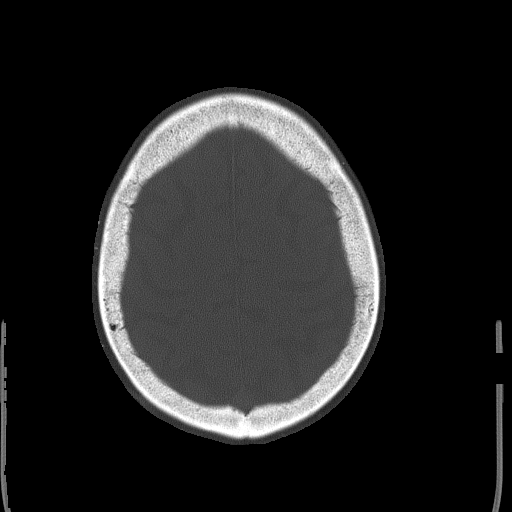

[16 of 30 positions shown; findings below may reference images not displayed]

FINDINGS: There is an air-fluid level within the right maxillary
sinus.  The orbits and calvarium have a normal appearance. The
ventricles are normal in size, shape, and position.  There is no
mass effect or midline shift.  No acute hemorrhage or abnormal
extra-axial fluid collections are identified.  The gray/white
differentiation is normal.
IMPRESSION: There is no evidence of acute intracranial abnormality or skull
fracture.

CT MAXILLOFACIAL
FINDINGS: There is a nondisplaced comminuted fracture of the
nasal bones at the midline.  There is an air-fluid level within the
right maxillary sinus and gas within the soft tissues lateral to
the right maxillary sinus suggesting a fracture; however, the
fracture must be nondisplaced, as it is not visualized.
IMPRESSION: Comminuted, nondisplaced nasal bone fractures.

Occult fracture of the right maxillary sinus is suspected laterally
with abnormal soft tissue gas adjacent to the lateral wall and an
air-fluid level within the sinus.

## 2012-05-22 ENCOUNTER — Other Ambulatory Visit: Payer: Self-pay | Admitting: *Deleted

## 2012-05-22 MED ORDER — EZETIMIBE 10 MG PO TABS: 10.0000 mg | ORAL_TABLET | Freq: Every day | ORAL | Status: DC

## 2012-05-26 DIAGNOSIS — H35359 Cystoid macular degeneration, unspecified eye: Secondary | ICD-10-CM | POA: Diagnosis not present

## 2012-05-26 DIAGNOSIS — H35059 Retinal neovascularization, unspecified, unspecified eye: Secondary | ICD-10-CM | POA: Diagnosis not present

## 2012-05-26 DIAGNOSIS — H3530 Unspecified macular degeneration: Secondary | ICD-10-CM | POA: Diagnosis not present

## 2012-07-06 ENCOUNTER — Encounter: Payer: Self-pay | Admitting: Cardiology

## 2012-07-06 ENCOUNTER — Ambulatory Visit (INDEPENDENT_AMBULATORY_CARE_PROVIDER_SITE_OTHER): Payer: Medicare Other | Admitting: Cardiology

## 2012-07-06 ENCOUNTER — Other Ambulatory Visit (INDEPENDENT_AMBULATORY_CARE_PROVIDER_SITE_OTHER): Payer: Medicare Other

## 2012-07-06 VITALS — BP 132/58 | HR 76 | Ht 62.0 in | Wt 135.0 lb

## 2012-07-06 DIAGNOSIS — E7800 Pure hypercholesterolemia, unspecified: Secondary | ICD-10-CM | POA: Diagnosis not present

## 2012-07-06 DIAGNOSIS — E785 Hyperlipidemia, unspecified: Secondary | ICD-10-CM

## 2012-07-06 DIAGNOSIS — I10 Essential (primary) hypertension: Secondary | ICD-10-CM | POA: Diagnosis not present

## 2012-07-06 DIAGNOSIS — I119 Hypertensive heart disease without heart failure: Secondary | ICD-10-CM

## 2012-07-06 DIAGNOSIS — R011 Cardiac murmur, unspecified: Secondary | ICD-10-CM

## 2012-07-06 LAB — HEPATIC FUNCTION PANEL
ALT: 13 U/L (ref 0–35)
AST: 21 U/L (ref 0–37)
Albumin: 4 g/dL (ref 3.5–5.2)
Alkaline Phosphatase: 47 U/L (ref 39–117)
Bilirubin, Direct: 0 mg/dL (ref 0.0–0.3)
Total Protein: 7.1 g/dL (ref 6.0–8.3)

## 2012-07-06 LAB — BASIC METABOLIC PANEL
BUN: 31 mg/dL — ABNORMAL HIGH (ref 6–23)
Calcium: 10.3 mg/dL (ref 8.4–10.5)
Creatinine, Ser: 2.3 mg/dL — ABNORMAL HIGH (ref 0.4–1.2)
GFR: 20.7 mL/min — ABNORMAL LOW (ref >60.00)

## 2012-07-06 LAB — LIPID PANEL
Cholesterol: 250 mg/dL — ABNORMAL HIGH (ref 0–200)
VLDL: 25 mg/dL (ref 0.0–40.0)

## 2012-07-06 NOTE — Patient Instructions (Addendum)
Will obtain labs today and call you with the results (lp/bmet/hfp)  Your physician recommends that you continue on your current medications as directed. Please refer to the Current Medication list given to you today.  Your physician wants you to follow-up in: 6 months with fasting labs (lp/bmet/hfp)  You will receive a reminder letter in the mail two months in advance. If you don't receive a letter, please call our office to schedule the follow-up appointment.  

## 2012-07-06 NOTE — Assessment & Plan Note (Signed)
The patient has a known soft aortic outflow murmur.  The murmur seems unchanged from previous visits and she is not having any symptoms referable to her aortic valve disease.

## 2012-07-06 NOTE — Assessment & Plan Note (Signed)
Blood pressure was remaining stable on current medication.  She has not been having any dizzy spells or syncope.  She has not been having any headaches.  Her energy level is good.

## 2012-07-06 NOTE — Progress Notes (Signed)
Cindy Harvey Date of Birth:  1917-01-16 [facility] [address] [place], [place]  [address] [phone]  Fax   [phone]  HPI: This pleasant 79 year old widowed woman in for a scheduled followup office visit. She has a history of high blood pressure and high cholesterol. She does not have any history of ischemic heart disease. She has had some problems with poor balance. She has been feeling well with no new complaints today. She has a known mild heart murmur and had an echocardiogram 07/27/11 showed an ejection fraction of 55-60% and a mild aortic stenosis with mild aortic insufficiency.   Current Outpatient Prescriptions  Medication Sig Dispense Refill  . acetaminophen (TYLENOL) 500 MG tablet OTC as directed.       Marland Kitchen ALPRAZolam (XANAX) 0.25 MG tablet Take 1 tablet (0.25 mg total) by mouth 2 (two) times daily as needed.  60 tablet  0  . amLODipine (NORVASC) 5 MG tablet TAKE 1 TABLET (5 MG TOTAL) BY MOUTH DAILY.  30 tablet  11  . Calcium Carbonate-Vitamin D (CALCIUM 600+D) 600-200 MG-UNIT TABS Take 2 tablets by mouth daily.       Marland Kitchen ezetimibe (ZETIA) 10 MG tablet Take 1 tablet (10 mg total) by mouth daily.  30 tablet  3  . guaiFENesin (MUCINEX) 600 MG 12 hr tablet Take 600 mg by mouth as needed.        Marland Kitchen lisinopril (PRINIVIL,ZESTRIL) 20 MG tablet TAKE 1 TABLET (20 MG TOTAL) BY MOUTH DAILY.  30 tablet  11  . Multiple Vitamins-Minerals (OCUVITE PO) Take by mouth daily.        . multivitamin (THERAGRAN) per tablet Take 1 tablet by mouth daily.        . niacin (NIASPAN) 500 MG CR tablet Take 2 tablets (1,000 mg total) by mouth at bedtime.  60 tablet  12  . Nutritional Supplements (ENSURE PO) Take by mouth at bedtime.       . promethazine (PHENERGAN) 25 MG tablet Take 25 mg by mouth as needed. For nausea        . fexofenadine (ALLEGRA) 60 MG tablet Take 60 mg by mouth daily.       No current facility-administered medications for this visit.    Allergies  Allergen  Reactions  . Asa Buff (Mag (Buffered Aspirin)     Nose bleeds  . Lipitor (Atorvastatin Calcium)     Headaches & dizzy  . Zocor (Simvastatin - High Dose)     myalgia    Patient Active Problem List   Diagnosis Date Noted  . HYPERLIPIDEMIA 10/24/2008    Priority: High  . Depression (emotion) 05/21/2010    Priority: Medium  . HYPERTENSION 10/24/2008    Priority: Medium  . Heart murmur 07/21/2011  . Post-menopausal 07/14/2010  . Osteoporosis screening 07/14/2010  . Hordeolum 07/07/2010  . ALLERGIC RHINITIS 10/24/2008  . OSTEOARTHRITIS, KNEE 10/24/2008    History  Smoking status  . Never Smoker   Smokeless tobacco  . Not on file    History  Alcohol Use No    Family History  Problem Relation Age of Onset  . Heart disease Mother   . Stroke Mother   . Stroke Sister   . Cancer Maternal Aunt     breast CA  . Cancer Cousin     breast cancer    Review of Systems: The patient denies any heat or cold intolerance.  No weight gain or weight loss.  The patient denies headaches or  blurry vision.  There is no cough or sputum production.  The patient denies dizziness.  There is no hematuria or hematochezia.  The patient denies any muscle aches or arthritis.  The patient denies any rash.  The patient denies frequent falling or instability.  There is no history of depression or anxiety.  All other systems were reviewed and are negative.   Physical Exam: Filed Vitals:   07/06/12 0851  BP: 132/58  Pulse: 76   the general appearance reveals a well-developed well-nourished elderly woman who is pleasant and in good spirits.  She is looking forward to reaching 79 years old.The head and neck exam reveals pupils equal and reactive.  Extraocular movements are full.  There is no scleral icterus.  The mouth and pharynx are normal.  The neck is supple.  The carotids reveal no bruits.  The jugular venous pressure is normal.  The  thyroid is not enlarged.  There is no lymphadenopathy.  The chest  is clear to percussion and auscultation.  There are no rales or rhonchi.  Expansion of the chest is symmetrical.  The precordium is quiet.  The first heart sound is normal.  The second heart sound is physiologically split.  There is no  gallop rub or click.  There is a soft systolic ejection murmur at the base.  There is no diastolic murmur. There is no abnormal lift or heave.  The abdomen is soft and nontender.  The bowel sounds are normal.  The liver and spleen are not enlarged.  There are no abdominal masses.  There are no abdominal bruits.  Extremities reveal good pedal pulses.  There is no phlebitis or edema.  There is no cyanosis or clubbing.  Strength is normal and symmetrical in all extremities.  There is no lateralizing weakness.  There are no sensory deficits.  The skin is warm and dry.  There is no rash.      Assessment / Plan: Continue same medication.  Blood work is pending.  Recheck in 6 months for followup office visit lipid panel hepatic function panel and basal metabolic panel.

## 2012-07-06 NOTE — Assessment & Plan Note (Signed)
The patient has a history of hypercholesterolemia.  She is intolerant of statins.  She is on Niaspan and ezetimibe ezetimibe.  We are checking fasting lab work today results pending

## 2012-07-07 ENCOUNTER — Telehealth: Payer: Self-pay | Admitting: *Deleted

## 2012-07-07 MED ORDER — LISINOPRIL 10 MG PO TABS: 10.0000 mg | ORAL_TABLET | Freq: Every day | ORAL | Status: DC

## 2012-07-07 NOTE — Telephone Encounter (Signed)
Advised patient of lab results and medication change  

## 2012-07-07 NOTE — Telephone Encounter (Signed)
Message copied by Burnell Blanks on Fri Forti 16, 2014  6:05 PM ------      Message from: Cassell Clement      Created: Thu Robison 15, 2014  8:26 PM       Please report.  Kidney function is not as good.  Avoid taking too many tylenols. Decrease lisinopril to 10 mg daily.  Drink plenty of water each day.      Cholesterol higher, watch diet. CSD ------

## 2012-09-27 ENCOUNTER — Other Ambulatory Visit: Payer: Self-pay | Admitting: Cardiology

## 2012-11-02 ENCOUNTER — Other Ambulatory Visit: Payer: Self-pay | Admitting: *Deleted

## 2012-11-02 MED ORDER — AMLODIPINE BESYLATE 5 MG PO TABS: ORAL_TABLET | ORAL | Status: DC

## 2012-11-26 DIAGNOSIS — Z23 Encounter for immunization: Secondary | ICD-10-CM | POA: Diagnosis not present

## 2012-12-10 ENCOUNTER — Other Ambulatory Visit: Payer: Self-pay | Admitting: Cardiology

## 2013-01-16 DIAGNOSIS — H3530 Unspecified macular degeneration: Secondary | ICD-10-CM | POA: Diagnosis not present

## 2013-01-16 DIAGNOSIS — H35359 Cystoid macular degeneration, unspecified eye: Secondary | ICD-10-CM | POA: Diagnosis not present

## 2013-01-16 DIAGNOSIS — H35059 Retinal neovascularization, unspecified, unspecified eye: Secondary | ICD-10-CM | POA: Diagnosis not present

## 2013-01-31 ENCOUNTER — Encounter: Payer: Self-pay | Admitting: Cardiology

## 2013-01-31 ENCOUNTER — Ambulatory Visit (INDEPENDENT_AMBULATORY_CARE_PROVIDER_SITE_OTHER): Payer: Medicare Other | Admitting: Cardiology

## 2013-01-31 ENCOUNTER — Other Ambulatory Visit (INDEPENDENT_AMBULATORY_CARE_PROVIDER_SITE_OTHER): Payer: Medicare Other

## 2013-01-31 VITALS — BP 144/70 | HR 72 | Ht 61.0 in | Wt 133.0 lb

## 2013-01-31 DIAGNOSIS — I10 Essential (primary) hypertension: Secondary | ICD-10-CM

## 2013-01-31 DIAGNOSIS — N186 End stage renal disease: Secondary | ICD-10-CM | POA: Insufficient documentation

## 2013-01-31 DIAGNOSIS — I119 Hypertensive heart disease without heart failure: Secondary | ICD-10-CM

## 2013-01-31 DIAGNOSIS — E785 Hyperlipidemia, unspecified: Secondary | ICD-10-CM

## 2013-01-31 LAB — LDL CHOLESTEROL, DIRECT: Direct LDL: 164.7 mg/dL

## 2013-01-31 LAB — HEPATIC FUNCTION PANEL
ALT: 18 U/L (ref 0–35)
AST: 20 U/L (ref 0–37)
Albumin: 3.9 g/dL (ref 3.5–5.2)
Alkaline Phosphatase: 43 U/L (ref 39–117)
Bilirubin, Direct: 0.1 mg/dL (ref 0.0–0.3)
Total Bilirubin: 0.5 mg/dL (ref 0.3–1.2)

## 2013-01-31 LAB — BASIC METABOLIC PANEL
CO2: 28 mEq/L (ref 19–32)
Calcium: 10.1 mg/dL (ref 8.4–10.5)
Chloride: 103 mEq/L (ref 96–112)
Creatinine, Ser: 2.7 mg/dL — ABNORMAL HIGH (ref 0.4–1.2)
Glucose, Bld: 81 mg/dL (ref 70–99)
Sodium: 140 mEq/L (ref 135–145)

## 2013-01-31 LAB — LIPID PANEL
Cholesterol: 237 mg/dL — ABNORMAL HIGH (ref 0–200)
Total CHOL/HDL Ratio: 5
Triglycerides: 107 mg/dL (ref 0.0–149.0)
VLDL: 21.4 mg/dL (ref 0.0–40.0)

## 2013-01-31 NOTE — Patient Instructions (Signed)
YOU NEED TO DRINK MORE WATER  Your physician recommends that you continue on your current medications as directed. Please refer to the Current Medication list given to you today.  Your physician wants you to follow-up in: 6 months with fasting labs (lp/bmet/hfp)  You will receive a reminder letter in the mail two months in advance. If you don't receive a letter, please call our office to schedule the follow-up appointment.

## 2013-01-31 NOTE — Assessment & Plan Note (Signed)
The patient is not having any headaches or dizzy spells.  No chest pain or angina.  She does not get any regular intentional aerobic exercise.

## 2013-01-31 NOTE — Assessment & Plan Note (Signed)
The patient is intolerant of statins.  She is on ezetimibe and niacin.  She will continue current medication

## 2013-01-31 NOTE — Assessment & Plan Note (Signed)
Recent labs show moderate renal insufficiency.  She admits to not drinking much water.  I have encouraged her to drink more water.  We will not increase her diuretic dose. The patient eats out at a fast food restaurant almost every day at lunch.  She is getting a lot of unintentional sodium in her diet by eating in the fast food establishments.Marland Kitchen

## 2013-01-31 NOTE — Progress Notes (Signed)
Quick Note:  Please report to patient. The recent labs are stable. Continue same medication and careful diet. The kidneys are not functioning as well as last time. She needs to be sure to drink plenty of water. Also decrease lisinopril to just 10 mg daily. ______

## 2013-01-31 NOTE — Progress Notes (Signed)
Cindy Harvey Date of Birth:  03/13/1916 [address] [place], [place]  [address] [phone]  Fax   [phone]  HPI: This pleasant 79 year old widowed woman in for a scheduled followup office visit. She has a history of high blood pressure and high cholesterol. She does not have any history of ischemic heart disease. She has had some problems with poor balance. She has been feeling well with no new complaints today. She has a known mild heart murmur and had an echocardiogram 07/27/11 showed an ejection fraction of 55-60% and a mild aortic stenosis with mild aortic insufficiency.  Since last visit her weight is down 2 pounds.   Current Outpatient Prescriptions  Medication Sig Dispense Refill  . acetaminophen (TYLENOL) 500 MG tablet OTC as directed.       Marland Kitchen ALPRAZolam (XANAX) 0.25 MG tablet Take 1 tablet (0.25 mg total) by mouth 2 (two) times daily as needed.  60 tablet  0  . amLODipine (NORVASC) 5 MG tablet TAKE 1 TABLET (5 MG TOTAL) BY MOUTH DAILY.  30 tablet  5  . Calcium Carbonate-Vitamin D (CALCIUM 600+D) 600-200 MG-UNIT TABS Take 2 tablets by mouth daily.       Marland Kitchen guaiFENesin (MUCINEX) 600 MG 12 hr tablet Take 600 mg by mouth as needed.        Marland Kitchen lisinopril (PRINIVIL,ZESTRIL) 20 MG tablet TAKE 1 TABLET (20 MG TOTAL) BY MOUTH DAILY.  30 tablet  11  . Multiple Vitamins-Minerals (OCUVITE PO) Take by mouth daily.        . multivitamin (THERAGRAN) per tablet Take 1 tablet by mouth daily.        . niacin (NIASPAN) 500 MG CR tablet Take 2 tablets (1,000 mg total) by mouth at bedtime.  60 tablet  12  . Nutritional Supplements (ENSURE PO) Take by mouth at bedtime.       . promethazine (PHENERGAN) 25 MG tablet Take 25 mg by mouth as needed. For nausea        . ZETIA 10 MG tablet TAKE 1 TABLET BY MOUTH DAILY.  30 tablet  3  . fexofenadine (ALLEGRA) 60 MG tablet Take 60 mg by mouth daily.       No current facility-administered medications for this visit.    Allergies    Allergen Reactions  . Asa Buff (Mag [Buffered Aspirin]     Nose bleeds  . Lipitor [Atorvastatin Calcium]     Headaches & dizzy  . Zocor [Simvastatin - High Dose]     myalgia    Patient Active Problem List   Diagnosis Date Noted  . HYPERLIPIDEMIA 10/24/2008    Priority: High  . Depression (emotion) 05/21/2010    Priority: Medium  . HYPERTENSION 10/24/2008    Priority: Medium  . Chronic renal insufficiency, stage III (moderate) 01/31/2013  . Heart murmur 07/21/2011  . Post-menopausal 07/14/2010  . Osteoporosis screening 07/14/2010  . Hordeolum 07/07/2010  . ALLERGIC RHINITIS 10/24/2008  . OSTEOARTHRITIS, KNEE 10/24/2008    History  Smoking status  . Never Smoker   Smokeless tobacco  . Not on file    History  Alcohol Use No    Family History  Problem Relation Age of Onset  . Heart disease Mother   . Stroke Mother   . Stroke Sister   . Cancer Maternal Aunt     breast CA  . Cancer Cousin     breast cancer    Review of Systems: The patient denies any heat  or cold intolerance.  No weight gain or weight loss.  The patient denies headaches or blurry vision.  There is no cough or sputum production.  The patient denies dizziness.  There is no hematuria or hematochezia.  The patient denies any muscle aches or arthritis.  The patient denies any rash.  The patient denies frequent falling or instability.  There is no history of depression or anxiety.  All other systems were reviewed and are negative.   Physical Exam: Filed Vitals:   01/31/13 0903  BP: 144/70  Pulse:    the general appearance reveals a well-developed well-nourished elderly woman who is pleasant and in good spirits.  She is looking forward to reaching 79 years old.The head and neck exam reveals pupils equal and reactive.  Extraocular movements are full.  There is no scleral icterus.  The mouth and pharynx are normal.  The neck is supple.  The carotids reveal no bruits.  The jugular venous pressure is  normal.  The  thyroid is not enlarged.  There is no lymphadenopathy.  The chest is clear to percussion and auscultation.  There are no rales or rhonchi.  Expansion of the chest is symmetrical.  The precordium is quiet.  The first heart sound is normal.  The second heart sound is physiologically split.  There is no  gallop rub or click.  There is a soft systolic ejection murmur at the base.  There is no diastolic murmur. There is no abnormal lift or heave.  The abdomen is soft and nontender.  The bowel sounds are normal.  The liver and spleen are not enlarged.  There are no abdominal masses.  There are no abdominal bruits.  Extremities reveal good pedal pulses.  There is no phlebitis or edema.  There is no cyanosis or clubbing.  Strength is normal and symmetrical in all extremities.  There is no lateralizing weakness.  There are no sensory deficits.  The skin is warm and dry.  There is no rash.      Assessment / Plan: Continue same medication.  Blood work is pending.  Recheck in 6 months for followup office visit lipid panel hepatic function panel and basal metabolic panel.  Work harder to be sure that she is drinking an adequate amount of water.

## 2013-02-02 ENCOUNTER — Telehealth: Payer: Self-pay | Admitting: Cardiology

## 2013-02-02 DIAGNOSIS — Z79899 Other long term (current) drug therapy: Secondary | ICD-10-CM

## 2013-02-02 NOTE — Telephone Encounter (Signed)
Per daughter patient has been taking Lisinopril 20 mg 1/2 tablet daily and was advised to decrease to 10 mg daily yesterday. Discussed with  Dr. Patty Sermons and will have patient hold Lisinopril for now, monitor blood pressure and let us know if it starts going up. Recheck bmet in 1 month. Daughter aware

## 2013-02-02 NOTE — Telephone Encounter (Signed)
New message     Returning a nurses call from yesterday regarding changing her dosage on her lisinopril.

## 2013-02-02 NOTE — Telephone Encounter (Signed)
Follow up    Calling back because no one has called her.  The week end is here and she needs to talk to a nurse today regarding her lisinopril.

## 2013-03-07 ENCOUNTER — Other Ambulatory Visit (INDEPENDENT_AMBULATORY_CARE_PROVIDER_SITE_OTHER): Payer: Medicare Other

## 2013-03-07 DIAGNOSIS — N183 Chronic kidney disease, stage 3 unspecified: Secondary | ICD-10-CM

## 2013-03-07 DIAGNOSIS — I119 Hypertensive heart disease without heart failure: Secondary | ICD-10-CM

## 2013-03-07 DIAGNOSIS — E785 Hyperlipidemia, unspecified: Secondary | ICD-10-CM

## 2013-03-07 LAB — BASIC METABOLIC PANEL
BUN: 36 mg/dL — ABNORMAL HIGH (ref 6–23)
CHLORIDE: 99 meq/L (ref 96–112)
CO2: 27 meq/L (ref 19–32)
CREATININE: 2.2 mg/dL — AB (ref 0.4–1.2)
Calcium: 10.1 mg/dL (ref 8.4–10.5)
GFR: 21.63 mL/min — ABNORMAL LOW (ref >60.00)
Glucose, Bld: 124 mg/dL — ABNORMAL HIGH (ref 70–99)
Potassium: 3.9 mEq/L (ref 3.5–5.1)
Sodium: 133 mEq/L — ABNORMAL LOW (ref 135–145)

## 2013-03-07 NOTE — Progress Notes (Signed)
Quick Note:  Please report to patient. The recent labs are stable. Continue same medication and careful diet. Kidney function is better. Creatinine is down to 2.2. BUN is still high. Drink plenty of water. ______

## 2013-03-09 ENCOUNTER — Other Ambulatory Visit: Payer: Self-pay | Admitting: Cardiology

## 2013-05-08 ENCOUNTER — Other Ambulatory Visit: Payer: Self-pay | Admitting: Cardiology

## 2013-06-30 ENCOUNTER — Emergency Department: Payer: Self-pay | Admitting: Emergency Medicine

## 2013-06-30 DIAGNOSIS — IMO0002 Reserved for concepts with insufficient information to code with codable children: Secondary | ICD-10-CM | POA: Diagnosis not present

## 2013-06-30 DIAGNOSIS — N289 Disorder of kidney and ureter, unspecified: Secondary | ICD-10-CM | POA: Diagnosis not present

## 2013-06-30 DIAGNOSIS — S46909A Unspecified injury of unspecified muscle, fascia and tendon at shoulder and upper arm level, unspecified arm, initial encounter: Secondary | ICD-10-CM | POA: Diagnosis not present

## 2013-06-30 DIAGNOSIS — S4980XA Other specified injuries of shoulder and upper arm, unspecified arm, initial encounter: Secondary | ICD-10-CM | POA: Diagnosis not present

## 2013-07-13 ENCOUNTER — Other Ambulatory Visit: Payer: Self-pay | Admitting: Cardiology

## 2013-08-03 ENCOUNTER — Telehealth: Payer: Self-pay | Admitting: Cardiology

## 2013-08-03 NOTE — Telephone Encounter (Signed)
Left message that these labs are already ordered to call if needs anything.

## 2013-08-03 NOTE — Telephone Encounter (Signed)
New message         Pt daughter would like for pt to do an electolyte lab when she comes into see dr Mare Ferrari

## 2013-08-06 ENCOUNTER — Ambulatory Visit: Payer: Medicare Other | Admitting: Cardiology

## 2013-08-06 ENCOUNTER — Other Ambulatory Visit: Payer: Medicare Other

## 2013-08-07 ENCOUNTER — Other Ambulatory Visit: Payer: Medicare Other

## 2013-08-07 ENCOUNTER — Ambulatory Visit (INDEPENDENT_AMBULATORY_CARE_PROVIDER_SITE_OTHER): Payer: Medicare Other | Admitting: Cardiology

## 2013-08-07 ENCOUNTER — Encounter: Payer: Self-pay | Admitting: Cardiology

## 2013-08-07 VITALS — BP 150/68 | HR 69 | Ht 61.0 in | Wt 130.0 lb

## 2013-08-07 DIAGNOSIS — E785 Hyperlipidemia, unspecified: Secondary | ICD-10-CM | POA: Diagnosis not present

## 2013-08-07 DIAGNOSIS — N183 Chronic kidney disease, stage 3 unspecified: Secondary | ICD-10-CM

## 2013-08-07 DIAGNOSIS — Z79899 Other long term (current) drug therapy: Secondary | ICD-10-CM | POA: Diagnosis not present

## 2013-08-07 DIAGNOSIS — R011 Cardiac murmur, unspecified: Secondary | ICD-10-CM

## 2013-08-07 DIAGNOSIS — I10 Essential (primary) hypertension: Secondary | ICD-10-CM

## 2013-08-07 DIAGNOSIS — I119 Hypertensive heart disease without heart failure: Secondary | ICD-10-CM | POA: Diagnosis not present

## 2013-08-07 LAB — LIPID PANEL
CHOLESTEROL: 227 mg/dL — AB (ref 0–200)
HDL: 51.2 mg/dL (ref >39.00)
LDL Cholesterol: 150 mg/dL — ABNORMAL HIGH (ref 0–99)
NonHDL: 175.8
Total CHOL/HDL Ratio: 4
Triglycerides: 128 mg/dL (ref 0.0–149.0)
VLDL: 25.6 mg/dL (ref 0.0–40.0)

## 2013-08-07 LAB — HEPATIC FUNCTION PANEL
ALK PHOS: 51 U/L (ref 39–117)
ALT: 15 U/L (ref 0–35)
AST: 23 U/L (ref 0–37)
Albumin: 4.1 g/dL (ref 3.5–5.2)
BILIRUBIN DIRECT: 0.1 mg/dL (ref 0.0–0.3)
Total Bilirubin: 0.4 mg/dL (ref 0.2–1.2)
Total Protein: 7 g/dL (ref 6.0–8.3)

## 2013-08-07 LAB — BASIC METABOLIC PANEL
BUN: 27 mg/dL — ABNORMAL HIGH (ref 6–23)
CHLORIDE: 95 meq/L — AB (ref 96–112)
CO2: 25 mEq/L (ref 19–32)
CREATININE: 2.1 mg/dL — AB (ref 0.4–1.2)
Calcium: 9.8 mg/dL (ref 8.4–10.5)
GFR: 22.78 mL/min — AB (ref >60.00)
Glucose, Bld: 96 mg/dL (ref 70–99)
Potassium: 4.3 mEq/L (ref 3.5–5.1)
Sodium: 129 mEq/L — ABNORMAL LOW (ref 135–145)

## 2013-08-07 NOTE — Assessment & Plan Note (Signed)
Her blood pressure was high on arrival.  I checked it later in the exam and it had come down to 150/68.  She denies any chest pain or shortness of breath or palpitations dizziness or syncope.

## 2013-08-07 NOTE — Patient Instructions (Addendum)
Your physician recommends that you continue on your current medications as directed. Please refer to the Current Medication list given to you today.  Your physician wants you to follow-up in: 6 months with labs. You will receive a reminder letter in the mail two months in advance. If you don't receive a letter, please call our office to schedule the follow-up appointment @ [phone]

## 2013-08-07 NOTE — Assessment & Plan Note (Signed)
The patient has a history of stage III chronic renal disease.  She admits to not drinking enough fluids or water.  We're rechecking her labs today.

## 2013-08-07 NOTE — Assessment & Plan Note (Signed)
She has not had any symptoms from her mild aortic valve disease.  No dizziness or chest pain or syncope or symptoms of CHF

## 2013-08-07 NOTE — Assessment & Plan Note (Signed)
Patient has a past history of hypercholesterolemia.  She is intolerant of statins.  She is taking ezetimibe and niacin

## 2013-08-07 NOTE — Progress Notes (Signed)
Cindy Harvey Date of Birth:  Aug 23, 1916 [place] [address] [place]  [address] [phone]  Fax   [phone]  HPI: This pleasant 80 year old widowed woman in for a scheduled followup office visit. She has a history of high blood pressure and high cholesterol. She does not have any history of ischemic heart disease. She has had some problems with poor balance. She has been feeling well with no new complaints today. She has a known mild heart murmur and had an echocardiogram 07/27/11 showed an ejection fraction of 55-60% and a mild aortic stenosis with mild aortic insufficiency. Since last visit her weight is down 3 pounds.  Since last visit she has been having increased problems with confusion and her family is concerned about her.  They would like her to have a sitter in the home with her but the patient has been resistant to this idea.   Current Outpatient Prescriptions  Medication Sig Dispense Refill  . acetaminophen (TYLENOL) 500 MG tablet OTC as directed.       Marland Kitchen ALPRAZolam (XANAX) 0.25 MG tablet Take 1 tablet (0.25 mg total) by mouth 2 (two) times daily as needed.  60 tablet  0  . amLODipine (NORVASC) 5 MG tablet TAKE 1 TABLET (5 MG TOTAL) BY MOUTH DAILY.  30 tablet  5  . Calcium Carbonate-Vitamin D (CALCIUM 600+D) 600-200 MG-UNIT TABS Take 2 tablets by mouth daily.       . fexofenadine (ALLEGRA) 60 MG tablet Take 60 mg by mouth daily.      Marland Kitchen guaiFENesin (MUCINEX) 600 MG 12 hr tablet Take 600 mg by mouth as needed.        . Multiple Vitamins-Minerals (OCUVITE PO) Take by mouth daily.        . multivitamin (THERAGRAN) per tablet Take 1 tablet by mouth daily.        . niacin (NIASPAN) 500 MG CR tablet Take 2 tablets (1,000 mg total) by mouth at bedtime.  60 tablet  12  . Nutritional Supplements (ENSURE PO) Take by mouth at bedtime.       . promethazine (PHENERGAN) 25 MG tablet Take 25 mg by mouth as needed. For nausea        . ZETIA 10 MG tablet  TAKE 1 TABLET BY MOUTH DAILY.  30 tablet  3  . ZETIA 10 MG tablet TAKE 1 TABLET BY MOUTH DAILY.  30 tablet  0   No current facility-administered medications for this visit.    Allergies  Allergen Reactions  . Asa Buff (Mag [Buffered Aspirin]     Nose bleeds  . Lipitor [Atorvastatin Calcium]     Headaches & dizzy  . Zocor [Simvastatin - High Dose]     myalgia    Patient Active Problem List   Diagnosis Date Noted  . HYPERLIPIDEMIA 10/24/2008    Priority: High  . Depression (emotion) 05/21/2010    Priority: Medium  . HYPERTENSION 10/24/2008    Priority: Medium  . Chronic renal insufficiency, stage III (moderate) 01/31/2013  . Heart murmur 07/21/2011  . Post-menopausal 07/14/2010  . Osteoporosis screening 07/14/2010  . Hordeolum 07/07/2010  . ALLERGIC RHINITIS 10/24/2008  . OSTEOARTHRITIS, KNEE 10/24/2008    History  Smoking status  . Never Smoker   Smokeless tobacco  . Not on file    History  Alcohol Use No    Family History  Problem Relation Age of Onset  . Heart disease Mother   . Stroke  Mother   . Stroke Sister   . Cancer Maternal Aunt     breast CA  . Cancer Cousin     breast cancer    Review of Systems: The patient denies any heat or cold intolerance.  No weight gain or weight loss.  The patient denies headaches or blurry vision.  There is no cough or sputum production.  The patient denies dizziness.  There is no hematuria or hematochezia.  The patient denies any muscle aches or arthritis.  The patient denies any rash.  The patient denies frequent falling or instability.  There is no history of depression or anxiety.  All other systems were reviewed and are negative.   Physical Exam: Filed Vitals:   08/07/13 1021  BP: 150/68  Pulse:    the general appearance reveals an elderly pleasant woman in no acute distress.The head and neck exam reveals pupils equal and reactive.  Extraocular movements are full.  There is no scleral icterus.  The mouth and  pharynx are normal.  The neck is supple.  The carotids reveal no bruits.  The jugular venous pressure is normal.  The  thyroid is not enlarged.  There is no lymphadenopathy.  The chest is clear to percussion and auscultation.  There are no rales or rhonchi.  Expansion of the chest is symmetrical.  The precordium is quiet.  The first heart sound is normal.  The second heart sound is physiologically split.  There is a soft systolic ejection murmur at the base.  No diastolic murmur heard.  There is no abnormal lift or heave.  The abdomen is soft and nontender.  The bowel sounds are normal.  The liver and spleen are not enlarged.  There are no abdominal masses.  There are no abdominal bruits.  Extremities reveal good pedal pulses.  There is no phlebitis or edema.  There is no cyanosis or clubbing.  Strength is normal and symmetrical in all extremities.  There is no lateralizing weakness.  There are no sensory deficits.  The skin is warm and dry.  There is no rash.      Assessment / Plan:  1. hypertensive heart disease without heart failure 2. Dyslipidemia 3. mild aortic valve disease with mild aortic stenosis and mild aortic insufficiency. 4. early dementia but still living in her own home.  A family member is there with her at night.  Plan: Continue same medication.  I encouraged her to allow some caregivers to come into her home.  She has been resistant to this idea. Recheck in 6 months for followup office visit lipid panel hepatic function panel and basal metabolic panel. At her request I gave her a prescription for Prevnar 13 pneumonia shot which she will carry to her pharmacy

## 2013-08-27 DIAGNOSIS — Z23 Encounter for immunization: Secondary | ICD-10-CM | POA: Diagnosis not present

## 2013-09-08 ENCOUNTER — Other Ambulatory Visit: Payer: Self-pay | Admitting: Cardiology

## 2013-09-10 NOTE — Telephone Encounter (Signed)
Darlin Coco, MD at 08/07/2013  1:17 PM ZETIA 10 MG tablet  TAKE 1 TABLET BY MOUTH DAILY.   30 tablet   3  Your physician recommends that you continue on your current medications as directed. Please refer to the Current Medication list given to you today.

## 2013-09-11 DIAGNOSIS — H35359 Cystoid macular degeneration, unspecified eye: Secondary | ICD-10-CM | POA: Diagnosis not present

## 2013-09-11 DIAGNOSIS — H3530 Unspecified macular degeneration: Secondary | ICD-10-CM | POA: Diagnosis not present

## 2013-09-11 DIAGNOSIS — H35059 Retinal neovascularization, unspecified, unspecified eye: Secondary | ICD-10-CM | POA: Diagnosis not present

## 2013-09-26 ENCOUNTER — Ambulatory Visit (INDEPENDENT_AMBULATORY_CARE_PROVIDER_SITE_OTHER): Payer: Medicare Other | Admitting: Internal Medicine

## 2013-09-26 ENCOUNTER — Encounter: Payer: Self-pay | Admitting: Internal Medicine

## 2013-09-26 VITALS — BP 128/64 | HR 71 | Temp 97.8°F | Resp 16 | Ht 61.0 in | Wt 129.8 lb

## 2013-09-26 DIAGNOSIS — I119 Hypertensive heart disease without heart failure: Secondary | ICD-10-CM | POA: Diagnosis not present

## 2013-09-26 DIAGNOSIS — R5383 Other fatigue: Secondary | ICD-10-CM

## 2013-09-26 DIAGNOSIS — Z7189 Other specified counseling: Secondary | ICD-10-CM | POA: Diagnosis not present

## 2013-09-26 DIAGNOSIS — Z9181 History of falling: Secondary | ICD-10-CM

## 2013-09-26 DIAGNOSIS — E785 Hyperlipidemia, unspecified: Secondary | ICD-10-CM | POA: Diagnosis not present

## 2013-09-26 DIAGNOSIS — E7800 Pure hypercholesterolemia, unspecified: Secondary | ICD-10-CM

## 2013-09-26 DIAGNOSIS — E871 Hypo-osmolality and hyponatremia: Secondary | ICD-10-CM | POA: Insufficient documentation

## 2013-09-26 DIAGNOSIS — Z79899 Other long term (current) drug therapy: Secondary | ICD-10-CM | POA: Diagnosis not present

## 2013-09-26 DIAGNOSIS — R5381 Other malaise: Secondary | ICD-10-CM | POA: Diagnosis not present

## 2013-09-26 DIAGNOSIS — N183 Chronic kidney disease, stage 3 unspecified: Secondary | ICD-10-CM

## 2013-09-26 DIAGNOSIS — R011 Cardiac murmur, unspecified: Secondary | ICD-10-CM

## 2013-09-26 LAB — BASIC METABOLIC PANEL
BUN: 31 mg/dL — ABNORMAL HIGH (ref 6–23)
CHLORIDE: 97 meq/L (ref 96–112)
CO2: 26 mEq/L (ref 19–32)
Calcium: 10.3 mg/dL (ref 8.4–10.5)
Creatinine, Ser: 2.3 mg/dL — ABNORMAL HIGH (ref 0.4–1.2)
GFR: 20.96 mL/min — AB (ref >60.00)
Glucose, Bld: 83 mg/dL (ref 70–99)
POTASSIUM: 5 meq/L (ref 3.5–5.1)
SODIUM: 134 meq/L — AB (ref 135–145)

## 2013-09-26 LAB — HEPATIC FUNCTION PANEL
ALT: 16 U/L (ref 0–35)
AST: 19 U/L (ref 0–37)
Albumin: 4 g/dL (ref 3.5–5.2)
Alkaline Phosphatase: 56 U/L (ref 39–117)
BILIRUBIN DIRECT: 0.1 mg/dL (ref 0.0–0.3)
BILIRUBIN TOTAL: 0.6 mg/dL (ref 0.2–1.2)
Total Protein: 6.9 g/dL (ref 6.0–8.3)

## 2013-09-26 LAB — LIPID PANEL
CHOLESTEROL: 219 mg/dL — AB (ref 0–200)
HDL: 50.6 mg/dL (ref >39.00)
LDL CALC: 149 mg/dL — AB (ref 0–99)
NonHDL: 168.4
Total CHOL/HDL Ratio: 4
Triglycerides: 95 mg/dL (ref 0.0–149.0)
VLDL: 19 mg/dL (ref 0.0–40.0)

## 2013-09-26 LAB — TSH: TSH: 0.52 u[IU]/mL (ref 0.35–4.50)

## 2013-09-26 NOTE — Progress Notes (Signed)
Quick Note:  Please report to patient. The recent labs are stable. Continue same medication and careful diet. ______ 

## 2013-09-26 NOTE — Progress Notes (Signed)
Patient ID: Cindy Harvey, female   DOB: Oct 02, 1916, 80 y.o.   MRN: [id number]   Patient Active Problem List   Diagnosis Date Noted  . History of fall 09/28/2013  . DNR (do not resuscitate) discussion 09/26/2013  . Hyponatremia 09/26/2013  . Chronic renal insufficiency, stage III (moderate) 01/31/2013  . Heart murmur 07/21/2011  . Post-menopausal 07/14/2010  . Osteoporosis screening 07/14/2010  . Hordeolum 07/07/2010  . Depression (emotion) 05/21/2010  . HYPERLIPIDEMIA 10/24/2008  . HYPERTENSION 10/24/2008  . ALLERGIC RHINITIS 10/24/2008  . OSTEOARTHRITIS, KNEE 10/24/2008    Subjective:  CC:   Chief Complaint  Patient presents with  . Establish Care    HPI:   Cindy Harvey is a 80 y.o. female who presents as a new patient to establish primary care with the chief complaint of History of falls  Most recent one occurred while running to catch her hat that blew off her head. States that her feet got tumbled up.  No head injury, abrasions, or injured joints.    Prior fall occurred 2 yrs ago while running to [employer]. Sustained fractures to nose right wrist and left ring finger .  She ambulates without a walker or cane but has them if she needs them.  Has been wearing a Scientist, product/process development for the last 2 years.   Vision is limited by macular degeneration.  She denies nocturia, has occasional stress/urge incontinence.  Denies frequent constipation  , prn prune juice   New patient , accomnpaniedy by her daughter Cindy Harvey, a retired Therapist, sports.   Longevity with good health.  She has not driven in 6 yrs.  Lives a sedentary lifestyle  does not watch TV .  Lives in [place] to lunch every day and eats fast food.  She  lives with her son  ,  But is alone from 7 am to 7pm.  Dietary assistance from Meals on Wheels,    End of life discussion today,  DNR form filled out,  MOST form filled out    Past Medical History  Diagnosis Date  . Hypertension   . Hypercholesterolemia   .  Osteoarthritis     right knee  . Allergy     allergic rhinitis  . Urinary incontinence   . Macular degeneration   . Hearing loss   . History of shingles   . Pneumonia 2009    with dehydration and electrolyte imbalance    Past Surgical History  Procedure Laterality Date  . Tonsillectomy  1928    Family History  Problem Relation Age of Onset  . Heart disease Mother   . Stroke Mother   . Stroke Sister   . Cancer Maternal Aunt     breast CA  . Cancer Cousin     breast cancer    History   Social History  . Marital Status: Widowed    Spouse Name: N/A    Number of Children: N/A  . Years of Education: N/A   Occupational History  . Not on file.   Social History Main Topics  . Smoking status: Never Smoker   . Smokeless tobacco: Not on file  . Alcohol Use: No  . Drug Use: No  . Sexual Activity:    Other Topics Concern  . Not on file   Social History Narrative  . No narrative on file       @ALLHX @    Review of Systems:   The remainder of the review of  systems was negative except those addressed in the HPI.       Objective:  BP 128/64  Pulse 71  Temp(Src) 97.8 F (36.6 C) (Oral)  Resp 16  Ht 5\' 1"  (1.549 m)  Wt 129 lb 12 oz (58.854 kg)  BMI 24.53 kg/m2  SpO2 94%  General appearance: alert, cooperative and appears stated age Ears: normal TM's and external ear canals both ears Throat: lips, mucosa, and tongue normal; teeth and gums normal Neck: no adenopathy, no carotid bruit, supple, symmetrical, trachea midline and thyroid not enlarged, symmetric, no tenderness/mass/nodules Back: symmetric, no curvature. ROM normal. No CVA tenderness. Lungs: clear to auscultation bilaterally Heart: regular rate and rhythm, S1, S2 normal, no murmur, click, rub or gallop Abdomen: soft, non-tender; bowel sounds normal; no masses,  no organomegaly Pulses: 2+ and symmetric Skin: Skin color, texture, turgor normal. No rashes or lesions Lymph nodes: Cervical,  supraclavicular, and axillary nodes normal.  Assessment and Plan:  Hyponatremia Mild, rechecking today Lab Results  Component Value Date   NA 134* 09/26/2013   K 5.0 09/26/2013   CL 97 09/26/2013   CO2 26 09/26/2013     Hypercholesterolemia Managed with Aetia and niacin for years.  Given her extreme age  We discussed stopping the Zetia but she is asymptomatic and LFTS are normal   Lab Results  Component Value Date   ALT 16 09/26/2013   AST 19 09/26/2013   ALKPHOS 56 09/26/2013   BILITOT 0.6 09/26/2013     DNR (do not resuscitate) discussion End of life discussion was done today to determine patient's wishes.  She desires a natural death .  DNR and MOST forms completed  Chronic renal insufficiency, stage III (moderate) Discussed avoidance of  NSAIDs, Septea, and contrasted studies to preserve her GFR which is around 30 ml /min .  Lab Results  Component Value Date   CREATININE 2.3* 09/26/2013     History of fall Complicated by macular degeneration. Patient has no signs of gait disorder on today's exam.  checking vit d level.   She is wearing her life line and does not live alone   A total of 40 minutes was spent with patient more than half of which was spent in counseling, reviewing records from other providers and coordination of care.  Updated Medication List Outpatient Encounter Prescriptions as of 09/26/2013  Medication Sig  . acetaminophen (TYLENOL) 500 MG tablet OTC as directed.   Marland Kitchen ALPRAZolam (XANAX) 0.25 MG tablet Take 1 tablet (0.25 mg total) by mouth 2 (two) times daily as needed.  Marland Kitchen amLODipine (NORVASC) 5 MG tablet TAKE 1 TABLET (5 MG TOTAL) BY MOUTH DAILY.  . Calcium Carbonate-Vitamin D (CALCIUM 600+D) 600-200 MG-UNIT TABS Take 2 tablets by mouth daily.   . Multiple Vitamins-Minerals (OCUVITE PO) Take by mouth daily.    . multivitamin (THERAGRAN) per tablet Take 1 tablet by mouth daily.    . niacin (NIASPAN) 500 MG CR tablet Take 2 tablets (1,000 mg total) by mouth at  bedtime.  . Nutritional Supplements (ENSURE PO) Take by mouth at bedtime.   Marland Kitchen ZETIA 10 MG tablet TAKE 1 TABLET BY MOUTH DAILY.  . fexofenadine (ALLEGRA) 60 MG tablet Take 60 mg by mouth daily.  Marland Kitchen guaiFENesin (MUCINEX) 600 MG 12 hr tablet Take 600 mg by mouth as needed.    . promethazine (PHENERGAN) 25 MG tablet Take 25 mg by mouth as needed. For nausea    . [DISCONTINUED] ZETIA 10 MG tablet TAKE 1 TABLET  BY MOUTH DAILY.     Orders Placed This Encounter  Procedures  . T4  . TSH    Return in about 6 months (around 03/29/2014).

## 2013-09-26 NOTE — Patient Instructions (Signed)
I want you to try G2,  The red variety tastes the best.   And drink 3 glasses of it daily .  This will improve your sodium   You need a total of 64 ounces of water or liquid daily , even if you don't feel thirsty   We will repeat  Your sodium level daily

## 2013-09-26 NOTE — Progress Notes (Signed)
Pre-visit discussion using our clinic review tool. No additional management support is needed unless otherwise documented below in the visit note.  

## 2013-09-28 ENCOUNTER — Encounter: Payer: Self-pay | Admitting: Internal Medicine

## 2013-09-28 DIAGNOSIS — Z9181 History of falling: Secondary | ICD-10-CM | POA: Insufficient documentation

## 2013-09-28 NOTE — Assessment & Plan Note (Signed)
Mild, rechecking today Lab Results  Component Value Date   NA 134* 09/26/2013   K 5.0 09/26/2013   CL 97 09/26/2013   CO2 26 09/26/2013

## 2013-09-28 NOTE — Assessment & Plan Note (Signed)
Managed with Aetia and niacin for years.  Given her extreme age  We discussed stopping the Zetia but she is asymptomatic and LFTS are normal   Lab Results  Component Value Date   ALT 16 09/26/2013   AST 19 09/26/2013   ALKPHOS 56 09/26/2013   BILITOT 0.6 09/26/2013

## 2013-09-28 NOTE — Assessment & Plan Note (Signed)
End of life discussion was done today to determine patient's wishes.  She desires a natural death .  DNR and MOST forms completed

## 2013-09-28 NOTE — Assessment & Plan Note (Signed)
Discussed avoidance of  NSAIDs, Septea, and contrasted studies to preserve her GFR which is around 30 ml /min .  Lab Results  Component Value Date   CREATININE 2.3* 09/26/2013

## 2013-09-28 NOTE — Assessment & Plan Note (Signed)
Complicated by macular degeneration. Patient has no signs of gait disorder on today's exam.  checking vit d level.   She is wearing her life line and does not live alone

## 2013-10-04 LAB — T4 AND TSH: T4, Total: 9.1 ug/dL (ref 4.5–12.0)

## 2013-10-21 DIAGNOSIS — Z23 Encounter for immunization: Secondary | ICD-10-CM | POA: Diagnosis not present

## 2013-10-30 DIAGNOSIS — H903 Sensorineural hearing loss, bilateral: Secondary | ICD-10-CM | POA: Diagnosis not present

## 2013-10-30 DIAGNOSIS — H6120 Impacted cerumen, unspecified ear: Secondary | ICD-10-CM | POA: Diagnosis not present

## 2013-11-14 ENCOUNTER — Other Ambulatory Visit: Payer: Self-pay | Admitting: Cardiology

## 2013-12-03 ENCOUNTER — Ambulatory Visit (INDEPENDENT_AMBULATORY_CARE_PROVIDER_SITE_OTHER): Payer: Medicare Other | Admitting: Internal Medicine

## 2013-12-03 ENCOUNTER — Encounter: Payer: Self-pay | Admitting: Internal Medicine

## 2013-12-03 VITALS — BP 136/60 | HR 76 | Temp 98.6°F | Resp 16 | Ht 61.0 in | Wt 129.8 lb

## 2013-12-03 DIAGNOSIS — F411 Generalized anxiety disorder: Secondary | ICD-10-CM | POA: Diagnosis not present

## 2013-12-03 MED ORDER — PAROXETINE HCL 10 MG PO TABS: 10.0000 mg | ORAL_TABLET | Freq: Every day | ORAL | Status: DC

## 2013-12-03 NOTE — Progress Notes (Signed)
Patient ID: Cindy Harvey, female   DOB: 09-Jun-1916, 80 y.o.   MRN: [id number]   Patient Active Problem List   Diagnosis Date Noted  . Generalized anxiety disorder 12/04/2013  . History of fall 09/28/2013  . DNR (do not resuscitate) discussion 09/26/2013  . Hyponatremia 09/26/2013  . Chronic renal insufficiency, stage III (moderate) 01/31/2013  . Heart murmur 07/21/2011  . Post-menopausal 07/14/2010  . Osteoporosis screening 07/14/2010  . Hordeolum 07/07/2010  . Depression (emotion) 05/21/2010  . HYPERLIPIDEMIA 10/24/2008  . HYPERTENSION 10/24/2008  . ALLERGIC RHINITIS 10/24/2008  . OSTEOARTHRITIS, KNEE 10/24/2008    Subjective:  CC:   Chief Complaint  Patient presents with  . Anxiety    crying refusing to get in car, panics at darkness.    HPI:   Cindy Harvey is a 80 y.o. female who presents for  Increased anxiety and agitation.  Patient is brought in by her daughter who reports that for the last several weeks Tonia Ghent has been increasingly fearful of being left alone,  She lives with her son who works away from the home and is normally gone about 12 hours daily.  Son always calls patient at 6:45 pm before leaving work,  Patient's daughter and son in law take her to lunch daily and several other children and extended family are invovled to make sure she is visited and supervised daily. Recently she acting physically distressed about her son's absence during the day,  And refused to leave the house with her granddaughter for a lunch date because she thought another family member was coming to pick her up.   "I'm, fine" Patient is calm today and denies anxiety,  Speech is non pressured and patient provides history of family losses and neighbors that she likes to visit but hasn't.    Her daughter has had reports from the son and other family members about her increased anxiety as well  Daughter opines that patient is afraid of losing her independence and being moved to an assisted living  environment, despite her family's continued reassurance that she will remain at home.     Past Medical History  Diagnosis Date  . Hypertension   . Hypercholesterolemia   . Osteoarthritis     right knee  . Allergy     allergic rhinitis  . Urinary incontinence   . Macular degeneration   . Hearing loss   . History of shingles   . Pneumonia 2009    with dehydration and electrolyte imbalance    Past Surgical History  Procedure Laterality Date  . Tonsillectomy  1928       The following portions of the patient's history were reviewed and updated as appropriate: Allergies, current medications, and problem list.    Review of Systems:   Patient denies headache, fevers, malaise, unintentional weight loss, skin rash, eye pain, sinus congestion and sinus pain, sore throat, dysphagia,  hemoptysis , cough, dyspnea, wheezing, chest pain, palpitations, orthopnea, edema, abdominal pain, nausea, melena, diarrhea, constipation, flank pain, dysuria, hematuria, urinary  Frequency, nocturia, numbness, tingling, seizures,  Focal weakness, Loss of consciousness,  Tremor, insomnia, depression, anxiety, and suicidal ideation.     History   Social History  . Marital Status: Widowed    Spouse Name: N/A    Number of Children: N/A  . Years of Education: N/A   Occupational History  . Not on file.   Social History Main Topics  . Smoking status: Never Smoker   . Smokeless tobacco: Not on  file  . Alcohol Use: No  . Drug Use: No  . Sexual Activity:    Other Topics Concern  . Not on file   Social History Narrative  . No narrative on file    Objective:  Filed Vitals:   12/03/13 1632  BP: 136/60  Pulse: 76  Temp: 98.6 F (37 C)  Resp: 16     General appearance: alert, cooperative and appears stated age Ears: normal TM's and external ear canals both ears Throat: lips, mucosa, and tongue normal; teeth and gums normal Neck: no adenopathy, no carotid bruit, supple, symmetrical,  trachea midline and thyroid not enlarged, symmetric, no tenderness/mass/nodules Back: symmetric, no curvature. ROM normal. No CVA tenderness. Lungs: clear to auscultation bilaterally Heart: regular rate and rhythm, S1, S2 normal, no murmur, click, rub or gallop Abdomen: soft, non-tender; bowel sounds normal; no masses,  no organomegaly Pulses: 2+ and symmetric Skin: Skin color, texture, turgor normal. No rashes or lesions Lymph nodes: Cervical, supraclavicular, and axillary nodes normal.  Assessment and Plan:  Generalized anxiety disorder New onset, Christians be first sign of dementia and sundowning, but will initiate trial of paxil,  Return in one month   A total of 81minutes was spent with patient more than half of which was spent in counseling patient on the above mentioned issues and coordination of care.   Updated Medication List Outpatient Encounter Prescriptions as of 12/03/2013  Medication Sig  . acetaminophen (TYLENOL) 500 MG tablet OTC as directed.   Marland Kitchen amLODipine (NORVASC) 5 MG tablet TAKE 1 TABLET (5 MG TOTAL) BY MOUTH DAILY.  . Calcium Carbonate-Vitamin D (CALCIUM 600+D) 600-200 MG-UNIT TABS Take 2 tablets by mouth daily.   Marland Kitchen guaiFENesin (MUCINEX) 600 MG 12 hr tablet Take 600 mg by mouth as needed.    . Multiple Vitamins-Minerals (OCUVITE PO) Take by mouth daily.    . multivitamin (THERAGRAN) per tablet Take 1 tablet by mouth daily.    . niacin (NIASPAN) 500 MG CR tablet Take 2 tablets (1,000 mg total) by mouth at bedtime.  . Nutritional Supplements (ENSURE PO) Take by mouth at bedtime.   . promethazine (PHENERGAN) 25 MG tablet Take 25 mg by mouth as needed. For nausea    . ZETIA 10 MG tablet TAKE 1 TABLET BY MOUTH DAILY.  Marland Kitchen ALPRAZolam (XANAX) 0.25 MG tablet Take 1 tablet (0.25 mg total) by mouth 2 (two) times daily as needed.  . fexofenadine (ALLEGRA) 60 MG tablet Take 60 mg by mouth daily.  Marland Kitchen PARoxetine (PAXIL) 10 MG tablet Take 1 tablet (10 mg total) by mouth daily.      No orders of the defined types were placed in this encounter.    Return in about 4 weeks (around 12/31/2013).

## 2013-12-03 NOTE — Progress Notes (Signed)
Pre-visit discussion using our clinic review tool. No additional management support is needed unless otherwise documented below in the visit note.  

## 2013-12-03 NOTE — Patient Instructions (Addendum)
I am prescribing a low dose of generic paxil to help manage your anxiety .  Please take it daily .  Either in the morning or evening  Start with 1/2 tablet daily for the first few days to make sure you tolerate it.   Return in one month   Paroxetine tablets What is this medicine? PAROXETINE (pa ROX e teen) is used to treat depression. It Reier also be used to treat anxiety disorders, obsessive compulsive disorder, panic attacks, post traumatic stress, and premenstrual dysphoric disorder (PMDD). This medicine Boardley be used for other purposes; ask your health care provider or pharmacist if you have questions. COMMON BRAND NAME(S): Paxil, Pexeva What should I tell my health care provider before I take this medicine? They need to know if you have any of these conditions: -bleeding disorders -glaucoma -heart disease -kidney disease -liver disease -low levels of sodium in the blood -mania or bipolar disorder -seizures -suicidal thoughts, plans, or attempt; a previous suicide attempt by you or a family member -take MAOIs like Carbex, Eldepryl, Marplan, Nardil, and Parnate -take medicines that treat or prevent blood clots -an unusual or allergic reaction to paroxetine, other medicines, foods, dyes, or preservatives -pregnant or trying to get pregnant -breast-feeding How should I use this medicine? Take this medicine by mouth with a glass of water. Follow the directions on the prescription label. You can take it with or without food. Take your medicine at regular intervals. Do not take your medicine more often than directed. Do not stop taking this medicine suddenly except upon the advice of your doctor. Stopping this medicine too quickly Buntin cause serious side effects or your condition [place] worsen. A special MedGuide will be given to you by the pharmacist with each prescription and refill. Be sure to read this information carefully each time. Talk to your pediatrician regarding the use of this  medicine in children. Special care Butzin be needed. Overdosage: If you think you have taken too much of this medicine contact a poison control center or emergency room at once. NOTE: This medicine is only for you. Do not share this medicine with others. What if I miss a dose? If you miss a dose, take it as soon as you can. If it is almost time for your next dose, take only that dose. Do not take double or extra doses. What Antone interact with this medicine? Do not take this medicine with any of the following medications: -linezolid -MAOIs like Carbex, Eldepryl, Marplan, Nardil, and Parnate -methylene blue (injected into a vein) -pimozide -thioridazine This medicine Austill also interact with the following medications: -alcohol -aspirin and aspirin-like medicines -atomoxetine -certain medicines for depression, anxiety, or psychotic disturbances -certain medicines for irregular heart beat like propafenone, flecainide, encainide, and quinidine -certain medicines for migraine headache like almotriptan, eletriptan, frovatriptan, naratriptan, rizatriptan, sumatriptan, zolmitriptan -cimetidine -digoxin -diuretics -fentanyl -fosamprenavir/ritonavir -furazolidone -isoniazid -lithium -medicines that treat or prevent blood clots like warfarin, enoxaparin, and dalteparin -medicines for sleep -metoprolol -NSAIDs, medicines for pain and inflammation, like ibuprofen or naproxen -phenobarbital -phenytoin -procarbazine -procyclidine -rasagiline -supplements like St. John's wort, kava kava, valerian -tamoxifen -theophylline -tramadol -tryptophan This list Ducre not describe all possible interactions. Give your health care provider a list of all the medicines, herbs, non-prescription drugs, or dietary supplements you use. Also tell them if you smoke, drink alcohol, or use illegal drugs. Some items Alyea interact with your medicine. What should I watch for while using this medicine? Tell your doctor if  your symptoms do  not get better or if they get worse. Visit your doctor or health care professional for regular checks on your progress. Because it St[place]ell take several weeks to see the full effects of this medicine, it is important to continue your treatment as prescribed by your doctor. Patients and their families should watch out for new or worsening thoughts of suicide or depression. Also watch out for sudden changes in feelings such as feeling anxious, agitated, panicky, irritable, hostile, aggressive, impulsive, severely restless, overly excited and hyperactive, or not being able to sleep. If this happens, especially at the beginning of treatment or after a change in dose, call your health care professional. Dennis Bast Geers get drowsy or dizzy. Do not drive, use machinery, or do anything that needs mental alertness until you know how this medicine affects you. Do not stand or sit up quickly, especially if you are an older patient. This reduces the risk of dizzy or fainting spells. Alcohol Idris interfere with the effect of this medicine. Avoid alcoholic drinks. Your mouth Bleau get dry. Chewing sugarless gum or sucking hard candy, and drinking plenty of water will help. Contact your doctor if the problem does not go away or is severe. What side effects Bonano I notice from receiving this medicine? Side effects that you should report to your doctor or health care professional as soon as possible: -allergic reactions like skin rash, itching or hives, swelling of the face, lips, or tongue -black or bloody stools, blood in the urine or vomit -fast, irregular heartbeat -hallucination, loss of contact with reality -painful or prolonged erection (men) -seizures -suicidal thoughts or other mood changes -trouble passing urine or change in the amount of urine -unusual bleeding or bruising -unusually weak or tired -vomiting Side effects that usually do not require medical attention (report to your doctor or health care  professional if they continue or are bothersome): -change in appetite, weight -change in sex drive or performance -constipation or diarrhea -difficulty sleeping -drowsy -headache -increased sweating -muscle pain or weakness -tremors This list Beets not describe all possible side effects. Call your doctor for medical advice about side effects. You Kaspar report side effects to FDA at 1-800-FDA-1088. Where should I keep my medicine? Keep out of the reach of children. Store at room temperature between 15 and 30 degrees C (59 and 86 degrees F). Keep container tightly closed. Throw away any unused medicine after the expiration date. NOTE: This sheet is a summary. It Iyer not cover all possible information. If you have questions about this medicine, talk to your doctor, pharmacist, or health care provider.  2015, Elsevier/Gold Standard. (2011-09-23 18:10:02)

## 2013-12-04 DIAGNOSIS — F411 Generalized anxiety disorder: Secondary | ICD-10-CM | POA: Insufficient documentation

## 2013-12-04 NOTE — Assessment & Plan Note (Signed)
New onset, Hilgeman be first sign of dementia and sundowning, but will initiate trial of paxil,  Return in one month

## 2013-12-11 ENCOUNTER — Telehealth: Payer: Self-pay | Admitting: Internal Medicine

## 2013-12-11 DIAGNOSIS — H9113 Presbycusis, bilateral: Secondary | ICD-10-CM

## 2013-12-11 DIAGNOSIS — Z0110 Encounter for examination of ears and hearing without abnormal findings: Secondary | ICD-10-CM

## 2013-12-11 NOTE — Telephone Encounter (Signed)
Needs referral for hearing test stated that patient has a hearing aid and needs evaluation to see if patient needs up-dated hearing aid. Patient daughter staed the referral needs to read Audiologic evaluation/ Audiogram  Dr. Arlie Solomons  Faxed to [phone] Please advise.

## 2013-12-12 NOTE — Telephone Encounter (Signed)
Referral is in process as requested 

## 2013-12-12 NOTE — Telephone Encounter (Signed)
Pt's daughter notified referral has been placed.

## 2013-12-19 ENCOUNTER — Other Ambulatory Visit: Payer: Self-pay | Admitting: Cardiology

## 2013-12-20 ENCOUNTER — Telehealth: Payer: Self-pay | Admitting: Internal Medicine

## 2013-12-20 DIAGNOSIS — H9190 Unspecified hearing loss, unspecified ear: Secondary | ICD-10-CM

## 2013-12-20 DIAGNOSIS — H903 Sensorineural hearing loss, bilateral: Secondary | ICD-10-CM | POA: Diagnosis not present

## 2013-12-20 NOTE — Telephone Encounter (Signed)
Joelene Millin from [facility] called saying Ms. Rash needs a referral in order to receive a Hearing Test. Per Joelene Millin she needs an Audiological Evaluation since she has Medicare. Please call Joelene Millin if you have any questions: [phone]. You can fax the referral to: [phone]. Thank you.

## 2013-12-20 NOTE — Telephone Encounter (Signed)
Referral is in process as requested to the [place]

## 2013-12-31 ENCOUNTER — Ambulatory Visit (INDEPENDENT_AMBULATORY_CARE_PROVIDER_SITE_OTHER): Payer: Medicare Other | Admitting: Internal Medicine

## 2013-12-31 ENCOUNTER — Encounter: Payer: Self-pay | Admitting: Internal Medicine

## 2013-12-31 VITALS — BP 158/60 | HR 71 | Temp 97.9°F | Resp 14 | Ht 61.0 in | Wt 128.4 lb

## 2013-12-31 DIAGNOSIS — F0392 Unspecified dementia, unspecified severity, with psychotic disturbance: Secondary | ICD-10-CM

## 2013-12-31 DIAGNOSIS — F0281 Dementia in other diseases classified elsewhere with behavioral disturbance: Secondary | ICD-10-CM | POA: Diagnosis not present

## 2013-12-31 DIAGNOSIS — F0391 Unspecified dementia with behavioral disturbance: Secondary | ICD-10-CM

## 2013-12-31 DIAGNOSIS — F411 Generalized anxiety disorder: Secondary | ICD-10-CM

## 2013-12-31 MED ORDER — QUETIAPINE FUMARATE 25 MG PO TABS: 25.0000 mg | ORAL_TABLET | Freq: Two times a day (BID) | ORAL | Status: DC

## 2013-12-31 NOTE — Progress Notes (Signed)
Patient ID: Cindy Harvey, female   DOB: May 06, 1916, 80 y.o.   MRN: [id number]   Patient Active Problem List   Diagnosis Date Noted  . 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 Generalized anxiety disorder 12/04/2013  . History of fall 09/28/2013  . DNR (do not resuscitate) discussion 09/26/2013  . Hyponatremia 09/26/2013  . Chronic renal insufficiency, stage III (moderate) 01/31/2013  . Heart murmur 07/21/2011  . Post-menopausal 07/14/2010  . Osteoporosis screening 07/14/2010  . Hordeolum 07/07/2010  . Depression (emotion) 05/21/2010  . HYPERLIPIDEMIA 10/24/2008  . HYPERTENSION 10/24/2008  . ALLERGIC RHINITIS 10/24/2008  . OSTEOARTHRITIS, KNEE 10/24/2008    Subjective:  CC:   Chief Complaint  Patient presents with  . Follow-up    4 week on generic paxil, daughter stated today is not a good day due to the rain . Hasa help at least 5 days per week.    HPI:   Cindy Harvey is a 80 y.o. female who presents for   Follow up on generalized anxiety disorder complicated by cognitive decline.  SSRI therapy was initiated  With low dose  paxil 4 weeks ago and she returns with her daughter today.  Daughtter notes mild improvement in anxiety but thinks her mother Hartman be "sundowning' when the weather is foul.  Family has hired sitters to stay with her until 6 pm weekdays while patient's son is at work.   She continues to have hallucinations about family members getting hurt,  Frequently calls daughter with bad news about son. She does not recall those episodes during today's encounter.  "I'm doing fine.  "My son takes good care of me.  I don't like being left alone "  Prior visit Oct 2015:  Increased anxiety and agitation.  Patient is brought in by her daughter who reports that for the last several weeks Tonia Ghent has been increasingly fearful of being left alone,  She lives with her son who works away from the home and is normally gone about 12 hours daily.  Son always calls patient at 6:45 pm  before leaving work,  Patient's daughter and son in law take her to lunch daily and several other children and extended family are involved to make sure she is visited and supervised daily. Recently she acting physically distressed about her son's absence during the day,  And refused to leave the house with her granddaughter for a lunch date because she thought another family member was coming to pick her up.   "I'm, fine" Patient is calm today and denies anxiety,  Speech is non pressured and patient provides history of family losses and neighbors that she likes to visit but hasn't.    Her daughter has had reports from the son and other family members about her increased anxiety as well  Daughter opines that patient is afraid of losing her independence and being moved to an assisted living environment, despite her family's continued reassurance that she will remain at home.     Past Medical History  Diagnosis Date  . Hypertension   . Hypercholesterolemia   . Osteoarthritis     right knee  . Allergy     allergic rhinitis  . Urinary incontinence   . Macular degeneration   . Hearing loss   . History of shingles   . Pneumonia 2009    with dehydration and electrolyte imbalance    Past Surgical History  Procedure Laterality Date  . Tonsillectomy  1928  The following portions of the patient's history were reviewed and updated as appropriate: Allergies, current medications, and problem list.    Review of Systems:   Patient denies headache, fevers, malaise, unintentional weight loss, skin rash, eye pain, sinus congestion and sinus pain, sore throat, dysphagia,  hemoptysis , cough, dyspnea, wheezing, chest pain, palpitations, orthopnea, edema, abdominal pain, nausea, melena, diarrhea, constipation, flank pain, dysuria, hematuria, urinary  Frequency, nocturia, numbness, tingling, seizures,  Focal weakness, Loss of consciousness,  Tremor, insomnia, depression, anxiety, and suicidal  ideation.     History   Social History  . Marital Status: Widowed    Spouse Name: N/A    Number of Children: N/A  . Years of Education: N/A   Occupational History  . Not on file.   Social History Main Topics  . Smoking status: Never Smoker   . Smokeless tobacco: Not on file  . Alcohol Use: No  . Drug Use: No  . Sexual Activity: Not on file   Other Topics Concern  . Not on file   Social History Narrative    Objective:  Filed Vitals:   12/31/13 1138  BP: 158/60  Pulse: 71  Temp: 97.9 F (36.6 C)  Resp: 14     General appearance: alert, cooperative and appears stated age Ears: normal TM's and external ear canals both ears Throat: lips, mucosa, and tongue normal; teeth and gums normal Neck: no adenopathy, no carotid bruit, supple, symmetrical, trachea midline and thyroid not enlarged, symmetric, no tenderness/mass/nodules Back: symmetric, no curvature. ROM normal. No CVA tenderness. Lungs: clear to auscultation bilaterally Heart: regular rate and rhythm, S1, S2 normal, no murmur, click, rub or gallop Abdomen: soft, non-tender; bowel sounds normal; no masses,  no organomegaly Pulses: 2+ and symmetric Skin: Skin color, texture, turgor normal. No rashes or lesions Lymph nodes: Cervical, supraclavicular, and axillary nodes normal.  Assessment and Plan:  Generalized anxiety disorder continue current dose of paxil for now..   Senile dementia with psychosis She continues to report receiving phone calls about her son having accidents.  Adding seroquel 25 mg daily at 6 pm,  Increased to 50 mg in 1-2 weeks as tolerated. Agree with family use of a sitter daily until 6 pm.    A total of 25 minutes of face to face time was spent with patient and daughter, more than half of which was spent in counselling and coordination of care    Updated Medication List Outpatient Encounter Prescriptions as of 12/31/2013  Medication Sig  . acetaminophen (TYLENOL) 500 MG tablet OTC as  directed.   Marland Kitchen ALPRAZolam (XANAX) 0.25 MG tablet Take 1 tablet (0.25 mg total) by mouth 2 (two) times daily as needed.  Marland Kitchen amLODipine (NORVASC) 5 MG tablet TAKE 1 TABLET (5 MG TOTAL) BY MOUTH DAILY.  . Calcium Carbonate-Vitamin D (CALCIUM 600+D) 600-200 MG-UNIT TABS Take 2 tablets by mouth daily.   Marland Kitchen guaiFENesin (MUCINEX) 600 MG 12 hr tablet Take 600 mg by mouth as needed.    Marland Kitchen lisinopril (PRINIVIL,ZESTRIL) 20 MG tablet TAKE 1 TABLET (20 MG TOTAL) BY MOUTH DAILY.  . Multiple Vitamins-Minerals (OCUVITE PO) Take by mouth daily.    . multivitamin (THERAGRAN) per tablet Take 1 tablet by mouth daily.    . niacin (NIASPAN) 500 MG CR tablet Take 2 tablets (1,000 mg total) by mouth at bedtime.  . Nutritional Supplements (ENSURE PO) Take by mouth at bedtime.   Marland Kitchen PARoxetine (PAXIL) 10 MG tablet Take 1 tablet (10 mg total) by mouth  daily.  . promethazine (PHENERGAN) 25 MG tablet Take 25 mg by mouth as needed. For nausea    . ZETIA 10 MG tablet TAKE 1 TABLET BY MOUTH DAILY.  . fexofenadine (ALLEGRA) 60 MG tablet Take 60 mg by mouth daily.  . QUEtiapine (SEROQUEL) 25 MG tablet Take 1 tablet (25 mg total) by mouth 2 (two) times daily.     No orders of the defined types were placed in this encounter.    No Follow-up on file.

## 2013-12-31 NOTE — Progress Notes (Signed)
Pre-visit discussion using our clinic review tool. No additional management support is needed unless otherwise documented below in the visit note.  

## 2013-12-31 NOTE — Patient Instructions (Addendum)
Continue the paxil daily   I am adding seroquel to help with the hallucinations.  Start with one tablet daily in the evening,  Increase to 2 tablets after a week   Blood pressure  Is elevated  Today,  Check to see if BP meds were taken this morning   If she has,  Please have someone check her BP daily for a few days so we can determine if meds need to be changed

## 2014-01-01 DIAGNOSIS — F0392 Unspecified dementia, unspecified severity, with psychotic disturbance: Secondary | ICD-10-CM | POA: Insufficient documentation

## 2014-01-01 DIAGNOSIS — F0391 Unspecified dementia with behavioral disturbance: Secondary | ICD-10-CM | POA: Insufficient documentation

## 2014-01-01 NOTE — Assessment & Plan Note (Signed)
continue current dose of paxil for now.Marland Kitchen

## 2014-01-01 NOTE — Assessment & Plan Note (Addendum)
She continues to report receiving phone calls about her son having accidents.  Adding seroquel 25 mg daily at 6 pm,  Increased to 50 mg in 1-2 weeks as tolerated. Agree with family use of a sitter daily until 6 pm.

## 2014-01-02 ENCOUNTER — Encounter: Payer: Self-pay | Admitting: Internal Medicine

## 2014-01-16 ENCOUNTER — Encounter: Payer: Self-pay | Admitting: Internal Medicine

## 2014-01-22 ENCOUNTER — Encounter: Payer: Self-pay | Admitting: Internal Medicine

## 2014-01-23 MED ORDER — PAROXETINE HCL 20 MG PO TABS: 20.0000 mg | ORAL_TABLET | Freq: Every day | ORAL | Status: DC

## 2014-01-23 NOTE — Telephone Encounter (Signed)
I don't see any documentation in chart about increasing dose, ok to send in 20 mg dose?See mychart

## 2014-02-05 ENCOUNTER — Encounter: Payer: Self-pay | Admitting: Cardiology

## 2014-02-05 ENCOUNTER — Ambulatory Visit (INDEPENDENT_AMBULATORY_CARE_PROVIDER_SITE_OTHER): Payer: Medicare Other | Admitting: Cardiology

## 2014-02-05 ENCOUNTER — Other Ambulatory Visit (INDEPENDENT_AMBULATORY_CARE_PROVIDER_SITE_OTHER): Payer: Medicare Other | Admitting: *Deleted

## 2014-02-05 VITALS — BP 140/64 | HR 72 | Ht 61.0 in | Wt 127.0 lb

## 2014-02-05 DIAGNOSIS — E785 Hyperlipidemia, unspecified: Secondary | ICD-10-CM

## 2014-02-05 DIAGNOSIS — I119 Hypertensive heart disease without heart failure: Secondary | ICD-10-CM | POA: Diagnosis not present

## 2014-02-05 DIAGNOSIS — I10 Essential (primary) hypertension: Secondary | ICD-10-CM

## 2014-02-05 DIAGNOSIS — N183 Chronic kidney disease, stage 3 unspecified: Secondary | ICD-10-CM

## 2014-02-05 DIAGNOSIS — N189 Chronic kidney disease, unspecified: Secondary | ICD-10-CM | POA: Diagnosis not present

## 2014-02-05 DIAGNOSIS — F32A Depression, unspecified: Secondary | ICD-10-CM

## 2014-02-05 DIAGNOSIS — F329 Major depressive disorder, single episode, unspecified: Secondary | ICD-10-CM | POA: Diagnosis not present

## 2014-02-05 LAB — LIPID PANEL
CHOL/HDL RATIO: 5
Cholesterol: 220 mg/dL — ABNORMAL HIGH (ref 0–200)
HDL: 43.9 mg/dL (ref >39.00)
LDL CALC: 151 mg/dL — AB (ref 0–99)
NonHDL: 176.1
Triglycerides: 126 mg/dL (ref 0.0–149.0)
VLDL: 25.2 mg/dL (ref 0.0–40.0)

## 2014-02-05 LAB — BASIC METABOLIC PANEL
BUN: 39 mg/dL — ABNORMAL HIGH (ref 6–23)
CHLORIDE: 99 meq/L (ref 96–112)
CO2: 26 meq/L (ref 19–32)
CREATININE: 2.1 mg/dL — AB (ref 0.4–1.2)
Calcium: 10.1 mg/dL (ref 8.4–10.5)
GFR: 22.76 mL/min — ABNORMAL LOW (ref >60.00)
Glucose, Bld: 88 mg/dL (ref 70–99)
POTASSIUM: 4.1 meq/L (ref 3.5–5.1)
Sodium: 133 mEq/L — ABNORMAL LOW (ref 135–145)

## 2014-02-05 LAB — HEPATIC FUNCTION PANEL
ALBUMIN: 4.1 g/dL (ref 3.5–5.2)
ALT: 17 U/L (ref 0–35)
AST: 24 U/L (ref 0–37)
Alkaline Phosphatase: 57 U/L (ref 39–117)
Bilirubin, Direct: 0 mg/dL (ref 0.0–0.3)
TOTAL PROTEIN: 7.1 g/dL (ref 6.0–8.3)
Total Bilirubin: 0.4 mg/dL (ref 0.2–1.2)

## 2014-02-05 NOTE — Assessment & Plan Note (Signed)
Her symptoms of depression and agitation have improved on low-dose Paxil

## 2014-02-05 NOTE — Assessment & Plan Note (Signed)
The patient denies any headaches or dizziness.  Denies chest pain.

## 2014-02-05 NOTE — Assessment & Plan Note (Signed)
The patient has a history of hypercholesterolemia.  We are checking lab work today.

## 2014-02-05 NOTE — Progress Notes (Signed)
Cindy Harvey Date of Birth:  July 30, 1916 [facility] [address] [place], [place]  [address] [phone]  Fax   [phone]  HPI: This pleasant 80 year old widowed woman in for a scheduled followup office visit. She has a history of high blood pressure and high cholesterol. She does not have any history of ischemic heart disease. She has had some problems with poor balance. She has been feeling well with no new complaints today. She has a known mild heart murmur and had an echocardiogram 07/27/11 showed an ejection fraction of 55-60% and a mild aortic stenosis with mild aortic insufficiency. Since last visit her weight is down 2 pounds.  Since last visit she has been having increased problems with confusion and her family is concerned about her.  They would like her to have a sitter in the home with her but the patient has been resistant to this idea.  However, the patient has agreed to have a sitter 3 days a week for several hours in the afternoons on Monday Wednesday and Friday.  The patient is now on low-dose Paxil and it has made a beneficial effect on her personality.   Current Outpatient Prescriptions  Medication Sig Dispense Refill  . acetaminophen (TYLENOL) 500 MG tablet OTC as directed.     Marland Kitchen ALPRAZolam (XANAX) 0.25 MG tablet Take 1 tablet (0.25 mg total) by mouth 2 (two) times daily as needed. 60 tablet 0  . amLODipine (NORVASC) 5 MG tablet TAKE 1 TABLET (5 MG TOTAL) BY MOUTH DAILY. 30 tablet 2  . Calcium Carbonate-Vitamin D (CALCIUM 600+D) 600-200 MG-UNIT TABS Take 2 tablets by mouth daily.     . GuaiFENesin (MUCINEX CHILDRENS PO) Take by mouth.    Marland Kitchen lisinopril (PRINIVIL,ZESTRIL) 20 MG tablet TAKE 1 TABLET (20 MG TOTAL) BY MOUTH DAILY. 30 tablet 2  . Multiple Vitamins-Minerals (OCUVITE PO) Take by mouth daily.      . multivitamin (THERAGRAN) per tablet Take 1 tablet by mouth daily.      . niacin (NIASPAN) 500 MG CR tablet Take 2 tablets (1,000 mg total) by  mouth at bedtime. 60 tablet 12  . Nutritional Supplements (ENSURE PO) Take by mouth at bedtime.     Marland Kitchen PARoxetine (PAXIL) 20 MG tablet Take 1 tablet (20 mg total) by mouth daily. 90 tablet 1  . promethazine (PHENERGAN) 25 MG tablet Take 25 mg by mouth as needed. For nausea      . ZETIA 10 MG tablet TAKE 1 TABLET BY MOUTH DAILY. 30 tablet 3  . fexofenadine (ALLEGRA) 60 MG tablet Take 60 mg by mouth daily.     No current facility-administered medications for this visit.    Allergies  Allergen Reactions  . Asa Buff (Mag [Buffered Aspirin]     Nose bleeds  . Lipitor [Atorvastatin Calcium]     Headaches & dizzy  . Seroquel [Quetiapine Fumarate]     Lethargy  . Zocor [Simvastatin - High Dose]     myalgia    Patient Active Problem List   Diagnosis Date Noted  . HYPERLIPIDEMIA 10/24/2008    Priority: High  . Depression (emotion) 05/21/2010    Priority: Medium  . HYPERTENSION 10/24/2008    Priority: Medium  . 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 Generalized anxiety disorder 12/04/2013  . History of fall 09/28/2013  . DNR (do not resuscitate) discussion 09/26/2013  . Hyponatremia 09/26/2013  . Chronic renal insufficiency, stage III (moderate) 01/31/2013  . Heart murmur  07/21/2011  . Post-menopausal 07/14/2010  . Osteoporosis screening 07/14/2010  . Hordeolum 07/07/2010  . ALLERGIC RHINITIS 10/24/2008  . OSTEOARTHRITIS, KNEE 10/24/2008    History  Smoking status  . Never Smoker   Smokeless tobacco  . Not on file    History  Alcohol Use No    Family History  Problem Relation Age of Onset  . Heart disease Mother   . Stroke Mother   . Stroke Sister   . Cancer Maternal Aunt     breast CA  . Cancer Cousin     breast cancer    Review of Systems: The patient denies any heat or cold intolerance.  No weight gain or weight loss.  The patient denies headaches or blurry vision.  There is no cough or sputum production.  The patient denies dizziness.  There is no  hematuria or hematochezia.  The patient denies any muscle aches or arthritis.  The patient denies any rash.  The patient denies frequent falling or instability.  There is no history of depression or anxiety.  All other systems were reviewed and are negative.   Physical Exam: Filed Vitals:   02/05/14 0815  BP: 140/64  Pulse: 72   the general appearance reveals an elderly pleasant woman in no acute distress.The head and neck exam reveals pupils equal and reactive.  Extraocular movements are full.  There is no scleral icterus.  The mouth and pharynx are normal.  The neck is supple.  The carotids reveal no bruits.  The jugular venous pressure is normal.  The  thyroid is not enlarged.  There is no lymphadenopathy.  The chest is clear to percussion and auscultation.  There are no rales or rhonchi.  Expansion of the chest is symmetrical.  The precordium is quiet.  The first heart sound is normal.  The second heart sound is physiologically split.  There is a soft systolic ejection murmur at the base.  No diastolic murmur heard.  There is no abnormal lift or heave.  The abdomen is soft and nontender.  The bowel sounds are normal.  The liver and spleen are not enlarged.  There are no abdominal masses.  There are no abdominal bruits.  Extremities reveal good pedal pulses.  There is no phlebitis or edema.  There is no cyanosis or clubbing.  Strength is normal and symmetrical in all extremities.  There is no lateralizing weakness.  There are no sensory deficits.  The skin is warm and dry.  There is no rash.      Assessment / Plan:  1. hypertensive heart disease without heart failure 2. Dyslipidemia 3. mild aortic valve disease with mild aortic stenosis and mild aortic insufficiency. 4. early dementia but still living in her own home.  A family member is there with her at night.  Plan: Continue same medication.  Recheck in 6 months for followup office visit lipid panel hepatic function panel and basal  metabolic panel. Since we last saw her she had the Prevnar shot.

## 2014-02-05 NOTE — Progress Notes (Signed)
Quick Note:  Please report to patient. The recent labs are stable. Continue same medication and careful diet. Creatinine better but BUN higher. Be sure to drink plenty of water. ______

## 2014-02-05 NOTE — Patient Instructions (Signed)
Will obtain labs today and call you with the results (LP/HFP/BMET)  Your physician recommends that you continue on your current medications as directed. Please refer to the Current Medication list given to you today.  Your physician wants you to follow-up in: 6 months with fasting labs (lp/bmet/hfp) You will receive a reminder letter in the mail two months in advance. If you don't receive a letter, please call our office to schedule the follow-up appointment.

## 2014-02-19 ENCOUNTER — Other Ambulatory Visit: Payer: Self-pay | Admitting: Cardiology

## 2014-03-05 ENCOUNTER — Other Ambulatory Visit: Payer: Self-pay | Admitting: Cardiology

## 2014-04-02 ENCOUNTER — Telehealth: Payer: Self-pay | Admitting: Internal Medicine

## 2014-04-02 ENCOUNTER — Emergency Department: Payer: Self-pay | Admitting: Emergency Medicine

## 2014-04-02 DIAGNOSIS — M7989 Other specified soft tissue disorders: Secondary | ICD-10-CM | POA: Diagnosis not present

## 2014-04-02 DIAGNOSIS — S0003XA Contusion of scalp, initial encounter: Secondary | ICD-10-CM | POA: Diagnosis not present

## 2014-04-02 DIAGNOSIS — S0990XA Unspecified injury of head, initial encounter: Secondary | ICD-10-CM | POA: Diagnosis not present

## 2014-04-02 DIAGNOSIS — R220 Localized swelling, mass and lump, head: Secondary | ICD-10-CM | POA: Diagnosis not present

## 2014-04-02 NOTE — Telephone Encounter (Signed)
Patient DPR brought patient into MD office after patient fell in drive way and hit her head on concrete . Patient was noted to have a contusion to the posterior head, Advised patient DPR patient should be sen in ER where appropriate DX can be made. Patient did not complain of pain of extreme ties or head.  Ask DPR if she would like assistance or EMS to be called DPR declined and stated she would take patient to the ER. Patient DPR stated she would call office later and schedule an appointment with MD to look into assistance with patients care. FYI

## 2014-04-19 ENCOUNTER — Other Ambulatory Visit: Payer: Self-pay | Admitting: Cardiology

## 2014-04-23 ENCOUNTER — Other Ambulatory Visit: Payer: Self-pay | Admitting: Cardiology

## 2014-05-02 ENCOUNTER — Emergency Department: Payer: Self-pay | Admitting: Emergency Medicine

## 2014-05-02 DIAGNOSIS — S0993XA Unspecified injury of face, initial encounter: Secondary | ICD-10-CM | POA: Diagnosis not present

## 2014-05-02 DIAGNOSIS — D649 Anemia, unspecified: Secondary | ICD-10-CM | POA: Diagnosis not present

## 2014-05-02 DIAGNOSIS — M25561 Pain in right knee: Secondary | ICD-10-CM | POA: Diagnosis not present

## 2014-05-02 DIAGNOSIS — S0083XA Contusion of other part of head, initial encounter: Secondary | ICD-10-CM | POA: Diagnosis not present

## 2014-05-02 DIAGNOSIS — S0081XA Abrasion of other part of head, initial encounter: Secondary | ICD-10-CM | POA: Diagnosis not present

## 2014-05-02 DIAGNOSIS — S0990XA Unspecified injury of head, initial encounter: Secondary | ICD-10-CM | POA: Diagnosis not present

## 2014-05-02 DIAGNOSIS — S80211A Abrasion, right knee, initial encounter: Secondary | ICD-10-CM | POA: Diagnosis not present

## 2014-05-02 DIAGNOSIS — N289 Disorder of kidney and ureter, unspecified: Secondary | ICD-10-CM | POA: Diagnosis not present

## 2014-05-02 DIAGNOSIS — S8991XA Unspecified injury of right lower leg, initial encounter: Secondary | ICD-10-CM | POA: Diagnosis not present

## 2014-05-02 DIAGNOSIS — S8001XA Contusion of right knee, initial encounter: Secondary | ICD-10-CM | POA: Diagnosis not present

## 2014-05-02 DIAGNOSIS — S0011XA Contusion of right eyelid and periocular area, initial encounter: Secondary | ICD-10-CM | POA: Diagnosis not present

## 2014-05-02 DIAGNOSIS — I10 Essential (primary) hypertension: Secondary | ICD-10-CM | POA: Diagnosis not present

## 2014-05-02 DIAGNOSIS — S8002XA Contusion of left knee, initial encounter: Secondary | ICD-10-CM | POA: Diagnosis not present

## 2014-05-02 DIAGNOSIS — Z79899 Other long term (current) drug therapy: Secondary | ICD-10-CM | POA: Diagnosis not present

## 2014-05-02 DIAGNOSIS — S8992XA Unspecified injury of left lower leg, initial encounter: Secondary | ICD-10-CM | POA: Diagnosis not present

## 2014-05-02 DIAGNOSIS — M25562 Pain in left knee: Secondary | ICD-10-CM | POA: Diagnosis not present

## 2014-05-03 ENCOUNTER — Telehealth: Payer: Self-pay | Admitting: Internal Medicine

## 2014-05-03 NOTE — Telephone Encounter (Signed)
Have faxed for er Notes.

## 2014-05-03 NOTE — Telephone Encounter (Signed)
Pt seen in the ER on 3/10 for a fall. Pt was told that she has stage 4 renal insuffiencey. Pt has had 3 falls in a monthe. Pt is scheduled for 3/14 at 6pm.msn

## 2014-05-06 ENCOUNTER — Encounter: Payer: Self-pay | Admitting: Internal Medicine

## 2014-05-06 ENCOUNTER — Ambulatory Visit (INDEPENDENT_AMBULATORY_CARE_PROVIDER_SITE_OTHER): Payer: Medicare Other | Admitting: Internal Medicine

## 2014-05-06 VITALS — BP 128/68 | HR 80 | Temp 97.8°F | Resp 16 | Ht 61.0 in | Wt 123.2 lb

## 2014-05-06 DIAGNOSIS — F0281 Dementia in other diseases classified elsewhere with behavioral disturbance: Secondary | ICD-10-CM

## 2014-05-06 DIAGNOSIS — F0392 Unspecified dementia, unspecified severity, with psychotic disturbance: Secondary | ICD-10-CM

## 2014-05-06 DIAGNOSIS — F0391 Unspecified dementia with behavioral disturbance: Secondary | ICD-10-CM

## 2014-05-06 DIAGNOSIS — Z9181 History of falling: Secondary | ICD-10-CM

## 2014-05-06 DIAGNOSIS — S0181XD Laceration without foreign body of other part of head, subsequent encounter: Secondary | ICD-10-CM

## 2014-05-06 NOTE — Patient Instructions (Signed)
Keep the wounds protected from dirt, bacteria and water until they heal  I agree that she needs MORE supervision to ensure her safety  Ok to liberalize her diet  Her kidney function  is stable compared to December  The anemia is mild and does not need treatment

## 2014-05-06 NOTE — Progress Notes (Signed)
Pre-visit discussion using our clinic review tool. No additional management support is needed unless otherwise documented below in the visit note.  

## 2014-05-06 NOTE — Progress Notes (Signed)
Patient ID: Cindy Harvey, female   DOB: 04-23-1916, 81 y.o.   MRN: [id number]  Patient Active Problem List   Diagnosis Date Noted  . Laceration of forehead without complication 95/62/1308  . 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 Generalized anxiety disorder 12/04/2013  . History of fall 09/28/2013  . DNR (do not resuscitate) discussion 09/26/2013  . Hyponatremia 09/26/2013  . Chronic renal insufficiency, stage III (moderate) 01/31/2013  . Heart murmur 07/21/2011  . Post-menopausal 07/14/2010  . Osteoporosis screening 07/14/2010  . Hordeolum 07/07/2010  . Depression (emotion) 05/21/2010  . HYPERLIPIDEMIA 10/24/2008  . Essential hypertension 10/24/2008  . ALLERGIC RHINITIS 10/24/2008  . OSTEOARTHRITIS, KNEE 10/24/2008    Subjective:  CC:   Chief Complaint  Patient presents with  . Follow-up    ER and fall    HPI:   Cindy Harvey is a 81 y.o. female who presents for   Past Medical History  Diagnosis Date  . Hypertension   . Hypercholesterolemia   . Osteoarthritis     right knee  . Allergy     allergic rhinitis  . Urinary incontinence   . Macular degeneration   . Hearing loss   . History of shingles   . Pneumonia 2009    with dehydration and electrolyte imbalance    Past Surgical History  Procedure Laterality Date  . Tonsillectomy  1928       The following portions of the patient's history were reviewed and updated as appropriate: Allergies, current medications, and problem list.    Review of Systems:   Patient denies headache, fevers, malaise, unintentional weight loss, skin rash, eye pain, sinus congestion and sinus pain, sore throat, dysphagia,  hemoptysis , cough, dyspnea, wheezing, chest pain, palpitations, orthopnea, edema, abdominal pain, nausea, melena, diarrhea, constipation, flank pain, dysuria, hematuria, urinary  Frequency, nocturia, numbness, tingling, seizures,  Focal weakness, Loss of consciousness,  Tremor, insomnia, depression,  anxiety, and suicidal ideation.     History   Social History  . Marital Status: Unknown    Spouse Name: N/A  . Number of Children: N/A  . Years of Education: N/A   Occupational History  . Not on file.   Social History Main Topics  . Smoking status: Never Smoker   . Smokeless tobacco: Not on file  . Alcohol Use: No  . Drug Use: No  . Sexual Activity: Not on file   Other Topics Concern  . Not on file   Social History Narrative    Objective:  Filed Vitals:   05/06/14 1827  BP: 128/68  Pulse: 80  Temp: 97.8 F (36.6 C)  Resp: 16     General appearance: alert, cooperative and appears stated age Ears: normal TM's and external ear canals both ears Throat: lips, mucosa, and tongue normal; teeth and gums normal Neck: no adenopathy, no carotid bruit, supple, symmetrical, trachea midline and thyroid not enlarged, symmetric, no tenderness/mass/nodules Back: symmetric, no curvature. ROM normal. No CVA tenderness. Lungs: clear to auscultation bilaterally Heart: regular rate and rhythm, S1, S2 normal, no murmur, click, rub or gallop Abdomen: soft, non-tender; bowel sounds normal; no masses,  no organomegaly Pulses: 2+ and symmetric Skin: Skin color, texture, turgor normal. No rashes or lesions Lymph nodes: Cervical, supraclavicular, and axillary nodes normal.  Assessment and Plan:  Laceration of forehead without complication The wound is shallow and open today.  She was Advised to keep wound covered to avoid contamination and prolonged healing phase .  Wound dressed today  History of fall She is averaging one per month and thus far has been fortunate. Her balance and wakefulness are limited by her dementia. Needs  More home based supervisions and care giving.    A total of 25 minutes was spent with patient more than half of which was spent in counseling patient on the above mentioned issues , reviewing and explaining recent labs and imaging studies done during recent  ER visit , and coordination of care. Updated Medication List Outpatient Encounter Prescriptions as of 05/06/2014  Medication Sig  . acetaminophen (TYLENOL) 500 MG tablet OTC as directed.   Marland Kitchen ALPRAZolam (XANAX) 0.25 MG tablet Take 1 tablet (0.25 mg total) by mouth 2 (two) times daily as needed.  Marland Kitchen amLODipine (NORVASC) 5 MG tablet TAKE 1 TABLET (5 MG TOTAL) BY MOUTH DAILY.  Marland Kitchen amLODipine (NORVASC) 5 MG tablet TAKE 1 TABLET (5 MG TOTAL) BY MOUTH DAILY.  . Calcium Carbonate-Vitamin D (CALCIUM 600+D) 600-200 MG-UNIT TABS Take 2 tablets by mouth daily.   . GuaiFENesin (MUCINEX CHILDRENS PO) Take by mouth.  . Multiple Vitamins-Minerals (OCUVITE PO) Take by mouth daily.    . multivitamin (THERAGRAN) per tablet Take 1 tablet by mouth daily.    . niacin (NIASPAN) 500 MG CR tablet Take 2 tablets (1,000 mg total) by mouth at bedtime.  . Nutritional Supplements (ENSURE PO) Take by mouth at bedtime.   Marland Kitchen PARoxetine (PAXIL) 20 MG tablet Take 1 tablet (20 mg total) by mouth daily.  Marland Kitchen ZETIA 10 MG tablet TAKE 1 TABLET BY MOUTH DAILY.  Marland Kitchen ZETIA 10 MG tablet TAKE 1 TABLET BY MOUTH DAILY.  . fexofenadine (ALLEGRA) 60 MG tablet Take 60 mg by mouth daily.  . promethazine (PHENERGAN) 25 MG tablet Take 25 mg by mouth as needed. For nausea    . [DISCONTINUED] lisinopril (PRINIVIL,ZESTRIL) 20 MG tablet TAKE 1 TABLET (20 MG TOTAL) BY MOUTH DAILY. (Patient not taking: Reported on 05/06/2014)     No orders of the defined types were placed in this encounter.    No Follow-up on file.

## 2014-05-08 DIAGNOSIS — S0181XA Laceration without foreign body of other part of head, initial encounter: Secondary | ICD-10-CM | POA: Insufficient documentation

## 2014-05-08 NOTE — Assessment & Plan Note (Signed)
extended talk with dtr about ned for increased supervision during the day .  They plan to have daytime care Mon through Friday

## 2014-05-08 NOTE — Assessment & Plan Note (Signed)
She is averaging one per month and thus far has been fortunate. Her balance and wakefulness are limited by her dementia. Needs  More home based supervisions and care giving.

## 2014-05-08 NOTE — Assessment & Plan Note (Signed)
The wound is shallow and open today.  She was Advised to keep wound covered to avoid contamination and prolonged healing phase .  Wound dressed today

## 2014-06-06 ENCOUNTER — Other Ambulatory Visit: Payer: Self-pay | Admitting: Internal Medicine

## 2014-08-13 DIAGNOSIS — Z961 Presence of intraocular lens: Secondary | ICD-10-CM | POA: Diagnosis not present

## 2014-08-13 DIAGNOSIS — D3132 Benign neoplasm of left choroid: Secondary | ICD-10-CM | POA: Diagnosis not present

## 2014-08-13 DIAGNOSIS — H3531 Nonexudative age-related macular degeneration: Secondary | ICD-10-CM | POA: Diagnosis not present

## 2014-08-21 ENCOUNTER — Other Ambulatory Visit (INDEPENDENT_AMBULATORY_CARE_PROVIDER_SITE_OTHER): Payer: Medicare Other | Admitting: *Deleted

## 2014-08-21 ENCOUNTER — Ambulatory Visit (INDEPENDENT_AMBULATORY_CARE_PROVIDER_SITE_OTHER): Payer: Medicare Other | Admitting: Cardiology

## 2014-08-21 ENCOUNTER — Encounter: Payer: Self-pay | Admitting: Cardiology

## 2014-08-21 VITALS — BP 130/56 | HR 67 | Ht 61.0 in | Wt 125.4 lb

## 2014-08-21 DIAGNOSIS — I119 Hypertensive heart disease without heart failure: Secondary | ICD-10-CM | POA: Diagnosis not present

## 2014-08-21 DIAGNOSIS — E785 Hyperlipidemia, unspecified: Secondary | ICD-10-CM

## 2014-08-21 DIAGNOSIS — F32A Depression, unspecified: Secondary | ICD-10-CM

## 2014-08-21 DIAGNOSIS — F329 Major depressive disorder, single episode, unspecified: Secondary | ICD-10-CM | POA: Diagnosis not present

## 2014-08-21 DIAGNOSIS — N189 Chronic kidney disease, unspecified: Secondary | ICD-10-CM

## 2014-08-21 DIAGNOSIS — N183 Chronic kidney disease, stage 3 unspecified: Secondary | ICD-10-CM

## 2014-08-21 LAB — CBC WITH DIFFERENTIAL/PLATELET
Basophils Absolute: 0 10*3/uL (ref 0.0–0.1)
Basophils Relative: 0.4 % (ref 0.0–3.0)
Eosinophils Absolute: 0.2 10*3/uL (ref 0.0–0.7)
Eosinophils Relative: 2 % (ref 0.0–5.0)
HCT: 33.3 % — ABNORMAL LOW (ref 36.0–46.0)
Hemoglobin: 11.2 g/dL — ABNORMAL LOW (ref 12.0–15.0)
Lymphocytes Relative: 24.4 % (ref 12.0–46.0)
Lymphs Abs: 2.4 10*3/uL (ref 0.7–4.0)
MCHC: 33.5 g/dL (ref 30.0–36.0)
MCV: 85.9 fl (ref 78.0–100.0)
MONO ABS: 0.9 10*3/uL (ref 0.1–1.0)
MONOS PCT: 8.9 % (ref 3.0–12.0)
Neutro Abs: 6.3 10*3/uL (ref 1.4–7.7)
Neutrophils Relative %: 64.3 % (ref 43.0–77.0)
PLATELETS: 348 10*3/uL (ref 150.0–400.0)
RBC: 3.87 Mil/uL (ref 3.87–5.11)
RDW: 14.5 % (ref 11.5–15.5)
WBC: 9.8 10*3/uL (ref 4.0–10.5)

## 2014-08-21 LAB — BASIC METABOLIC PANEL
BUN: 35 mg/dL — ABNORMAL HIGH (ref 6–23)
CHLORIDE: 100 meq/L (ref 96–112)
CO2: 28 mEq/L (ref 19–32)
Calcium: 9.9 mg/dL (ref 8.4–10.5)
Creatinine, Ser: 2.29 mg/dL — ABNORMAL HIGH (ref 0.40–1.20)
GFR: 21.02 mL/min — ABNORMAL LOW (ref >60.00)
GLUCOSE: 90 mg/dL (ref 70–99)
Potassium: 4.1 mEq/L (ref 3.5–5.1)
Sodium: 136 mEq/L (ref 135–145)

## 2014-08-21 LAB — LIPID PANEL
CHOLESTEROL: 220 mg/dL — AB (ref 0–200)
HDL: 52.5 mg/dL (ref >39.00)
LDL Cholesterol: 144 mg/dL — ABNORMAL HIGH (ref 0–99)
NONHDL: 167.5
Total CHOL/HDL Ratio: 4
Triglycerides: 117 mg/dL (ref 0.0–149.0)
VLDL: 23.4 mg/dL (ref 0.0–40.0)

## 2014-08-21 LAB — HEPATIC FUNCTION PANEL
ALBUMIN: 4.1 g/dL (ref 3.5–5.2)
ALT: 14 U/L (ref 0–35)
AST: 19 U/L (ref 0–37)
Alkaline Phosphatase: 71 U/L (ref 39–117)
Bilirubin, Direct: 0 mg/dL (ref 0.0–0.3)
Total Bilirubin: 0.4 mg/dL (ref 0.2–1.2)
Total Protein: 7.4 g/dL (ref 6.0–8.3)

## 2014-08-21 NOTE — Addendum Note (Signed)
Addended by: Eulis Foster on: 08/21/2014 09:55 AM   Modules accepted: Orders

## 2014-08-21 NOTE — Progress Notes (Signed)
Cardiology Office Note   Date:  08/21/2014   ID:  Cindy Rainbow Galan, DOB Dec 17, 1916, MRN [id number]  PCP:  Crecencio Mc, MD  Cardiologist: Darlin Coco MD  No chief complaint on file.     History of Present Illness: Cindy Harvey is a 81 y.o. female who presents for a six-month follow-up office visit   She has a history of high blood pressure and high cholesterol. She does not have any history of ischemic heart disease. She has had some problems with poor balance. She has been feeling well with no new complaints today. She has a known mild heart murmur and had an echocardiogram 07/27/11 showed an ejection fraction of 55-60% and a mild aortic stenosis with mild aortic insufficiency.  Since last visit the patient has had multiple falls.  She has had at least 3 emergency room visits.  Her daughter attributes some of these falls to the fact that she has poor vision.  Also she stays up very late at night and then is tired the next day.  Her daughter has not been able to change the patient's behavior.  Past Medical History  Diagnosis Date  . Hypertension   . Hypercholesterolemia   . Osteoarthritis     right knee  . Allergy     allergic rhinitis  . Urinary incontinence   . Macular degeneration   . Hearing loss   . History of shingles   . Pneumonia 2009    with dehydration and electrolyte imbalance    Past Surgical History  Procedure Laterality Date  . Tonsillectomy  1928     Current Outpatient Prescriptions  Medication Sig Dispense Refill  . acetaminophen (TYLENOL) 160 MG/5ML liquid Take 325 mg by mouth daily as needed for fever (knee pain or headache).    Marland Kitchen amLODipine (NORVASC) 5 MG tablet TAKE 1 TABLET (5 MG TOTAL) BY MOUTH DAILY. 30 tablet 4  . Calcium Carbonate-Vitamin D (CALCIUM 600+D) 600-200 MG-UNIT TABS Take 1 tablet by mouth daily.     . GuaiFENesin (MUCINEX CHILDRENS PO) Take 1 Dose by mouth daily as needed (cough).     . Multiple Vitamins-Minerals (OCUVITE PO) Take  1 tablet by mouth daily.     . multivitamin (THERAGRAN) per tablet Take 1 tablet by mouth daily.      . niacin (NIASPAN) 500 MG CR tablet Take 2 tablets (1,000 mg total) by mouth at bedtime. 60 tablet 12  . Nutritional Supplements (ENSURE PO) Take by mouth at bedtime.     Marland Kitchen PARoxetine (PAXIL) 20 MG tablet TAKE 1 TABLET (20 MG TOTAL) BY MOUTH DAILY. 90 tablet 1  . promethazine (PHENERGAN) 25 MG tablet Take 25 mg by mouth as needed. For nausea      . ZETIA 10 MG tablet TAKE 1 TABLET BY MOUTH DAILY. 30 tablet 3  . fexofenadine (ALLEGRA) 60 MG tablet Take 60 mg by mouth daily.     No current facility-administered medications for this visit.    Allergies:   Asa buff (mag; Lipitor; Seroquel; and Zocor    Social History:  The patient  reports that she has never smoked. She does not have any smokeless tobacco history on file. She reports that she does not drink alcohol or use illicit drugs.   Family History:  The patient's family history includes Cancer in her cousin and maternal aunt; Heart disease in her mother; Stroke in her mother and sister.    ROS:  Please see the history of present illness.  Otherwise, review of systems are positive for none.   All other systems are reviewed and negative.    PHYSICAL EXAM: VS:  BP 130/56 mmHg  Pulse 67  Ht 5\' 1"  (1.549 m)  Wt 125 lb 6.4 oz (56.881 kg)  BMI 23.71 kg/m2 , BMI Body mass index is 23.71 kg/(m^2). GEN: Well nourished, well developed, in no acute distress HEENT: normal Neck: no JVD, carotid bruits, or masses Cardiac: RRR; no murmurs, rubs, or gallops,no edema  Respiratory:  clear to auscultation bilaterally, normal work of breathing GI: soft, nontender, nondistended, + BS MS: no deformity or atrophy Skin: warm and dry, no rash Neuro:  Strength and sensation are intact Psych: euthymic mood, full affect   EKG:  EKG is not ordered today.    Recent Labs: 09/26/2013: TSH 0.52 08/21/2014: ALT 14; BUN 35*; Creatinine, Ser 2.29*;  Hemoglobin 11.2*; Platelets 348.0; Potassium 4.1; Sodium 136    Lipid Panel    Component Value Date/Time   CHOL 220* 08/21/2014 0955   TRIG 117.0 08/21/2014 0955   HDL 52.50 08/21/2014 0955   CHOLHDL 4 08/21/2014 0955   VLDL 23.4 08/21/2014 0955   LDLCALC 144* 08/21/2014 0955   LDLDIRECT 164.7 01/31/2013 0809      Wt Readings from Last 3 Encounters:  08/21/14 125 lb 6.4 oz (56.881 kg)  05/06/14 123 lb 4 oz (55.906 kg)  02/05/14 127 lb (57.607 kg)        ASSESSMENT AND PLAN:  1. hypertensive heart disease without heart failure 2. Dyslipidemia 3. mild aortic valve disease with mild aortic stenosis and mild aortic insufficiency. 4. early dementia but still living in her own home. A family member is there with her at night. 5.  Frequent mechanical falls.  No evidence of orthostatic hypotension or blood pressure fluctuations.  Plan: Continue same medication.  Recheck in 6 months for followup office visit lipid panel hepatic function panel and basal metabolic panel and CBC    Current medicines are reviewed at length with the patient today.  The patient does not have concerns regarding medicines.  The following changes have been made:  no change  Labs/ tests ordered today include:   Orders Placed This Encounter  Procedures  . CBC with Differential/Platelet      Signed, Darlin Coco MD 08/21/2014 12:53 PM    [facility] [address], [place], [place]  [address] Phone: [phone]; Fax: [phone]

## 2014-08-21 NOTE — Progress Notes (Signed)
Quick Note:  Please report to patient. The recent labs are stable. Continue same medication and careful diet. ______ 

## 2014-08-21 NOTE — Addendum Note (Signed)
Addended by: Eulis Foster on: 08/21/2014 09:54 AM   Modules accepted: Orders

## 2014-08-21 NOTE — Patient Instructions (Signed)
Medication Instructions:  Your physician recommends that you continue on your current medications as directed. Please refer to the Current Medication list given to you today.  Labwork: LP/BMET/HFP/CBC  Testing/Procedures: NONE  Follow-Up: Your physician wants you to follow-up in: [place] will receive a reminder letter in the mail two months in advance. If you don't receive a letter, please call our office to schedule the follow-up appointment.

## 2014-09-05 ENCOUNTER — Other Ambulatory Visit: Payer: Self-pay | Admitting: Cardiology

## 2014-10-31 ENCOUNTER — Other Ambulatory Visit: Payer: Self-pay | Admitting: Cardiology

## 2014-11-10 DIAGNOSIS — Z23 Encounter for immunization: Secondary | ICD-10-CM | POA: Diagnosis not present

## 2015-01-25 ENCOUNTER — Other Ambulatory Visit: Payer: Self-pay | Admitting: Cardiology

## 2015-01-27 ENCOUNTER — Other Ambulatory Visit: Payer: Self-pay | Admitting: Internal Medicine

## 2015-01-27 ENCOUNTER — Other Ambulatory Visit: Payer: Self-pay | Admitting: Cardiology

## 2015-01-27 NOTE — Telephone Encounter (Signed)
Last OV was in March please advise?

## 2015-01-29 NOTE — Telephone Encounter (Signed)
Spoke with the patient's daughter that she will need a OV to get further refills.  Daughter plans to call the office tomorrow to schedule.

## 2015-01-29 NOTE — Telephone Encounter (Signed)
90 day supply authorized and sent  .  Ov needed

## 2015-02-07 ENCOUNTER — Encounter: Payer: Self-pay | Admitting: Cardiology

## 2015-02-07 ENCOUNTER — Ambulatory Visit (INDEPENDENT_AMBULATORY_CARE_PROVIDER_SITE_OTHER): Payer: Medicare Other | Admitting: Cardiology

## 2015-02-07 VITALS — BP 104/60 | HR 70 | Ht 61.0 in | Wt 122.8 lb

## 2015-02-07 DIAGNOSIS — I119 Hypertensive heart disease without heart failure: Secondary | ICD-10-CM

## 2015-02-07 DIAGNOSIS — E785 Hyperlipidemia, unspecified: Secondary | ICD-10-CM

## 2015-02-07 LAB — HEPATIC FUNCTION PANEL
ALK PHOS: 70 U/L (ref 33–130)
ALT: 16 U/L (ref 6–29)
AST: 21 U/L (ref 10–35)
Albumin: 3.9 g/dL (ref 3.6–5.1)
BILIRUBIN INDIRECT: 0.4 mg/dL (ref 0.2–1.2)
Bilirubin, Direct: 0.1 mg/dL (ref <=0.2)
Total Bilirubin: 0.5 mg/dL (ref 0.2–1.2)
Total Protein: 7.1 g/dL (ref 6.1–8.1)

## 2015-02-07 LAB — BASIC METABOLIC PANEL
BUN: 33 mg/dL — ABNORMAL HIGH (ref 7–25)
CHLORIDE: 98 mmol/L (ref 98–110)
CO2: 26 mmol/L (ref 20–31)
Calcium: 9.9 mg/dL (ref 8.6–10.4)
Creat: 2.23 mg/dL — ABNORMAL HIGH (ref 0.60–0.88)
Glucose, Bld: 83 mg/dL (ref 65–99)
POTASSIUM: 4.1 mmol/L (ref 3.5–5.3)
SODIUM: 134 mmol/L — AB (ref 135–146)

## 2015-02-07 LAB — LIPID PANEL
Cholesterol: 206 mg/dL — ABNORMAL HIGH (ref 125–200)
HDL: 58 mg/dL (ref >=46)
LDL Cholesterol: 130 mg/dL — ABNORMAL HIGH (ref <130)
TRIGLYCERIDES: 90 mg/dL (ref <150)
Total CHOL/HDL Ratio: 3.6 Ratio (ref <=5.0)
VLDL: 18 mg/dL (ref <30)

## 2015-02-07 NOTE — Patient Instructions (Signed)
Medication Instructions:  Your physician recommends that you continue on your current medications as directed. Please refer to the Current Medication list given to you today.  Labwork: Lp/bmet/hfp  Testing/Procedures: none  Follow-Up: Your physician wants you to follow-up in: 6 months with fasting labs (lp/bmet/hfp) with Dr Nahser  You will receive a reminder letter in the mail two months in advance. If you don't receive a letter, please call our office to schedule the follow-up appointment.  If you need a refill on your cardiac medications before your next appointment, please call your pharmacy.  

## 2015-02-07 NOTE — Progress Notes (Signed)
Cardiology Office Note   Date:  02/07/2015   ID:  Cindy Harvey, DOB [birth year], MRN [id number]  PCP:  Crecencio Mc, MD  Cardiologist: Darlin Coco MD  No chief complaint on file.     History of Present Illness: Cindy Harvey is a 81 y.o. female who presents for a six-month follow-up office visit.  She has a history of high blood pressure and high cholesterol. She does not have any history of ischemic heart disease. She has had some problems with poor balance. She has been feeling well with no new complaints today. She has a known mild heart murmur and had an echocardiogram 07/27/11 showed an ejection fraction of 55-60% and a mild aortic stenosis with mild aortic insufficiency. Her daughter states that the patient has a history of seasonal affective disorder.  Past Medical History  Diagnosis Date  . Hypertension   . Hypercholesterolemia   . Osteoarthritis     right knee  . Allergy     allergic rhinitis  . Urinary incontinence   . Macular degeneration   . Hearing loss   . History of shingles   . Pneumonia 2009    with dehydration and electrolyte imbalance    Past Surgical History  Procedure Laterality Date  . Tonsillectomy  1928     Current Outpatient Prescriptions  Medication Sig Dispense Refill  . acetaminophen (TYLENOL) 160 MG/5ML liquid Take 325 mg by mouth daily as needed for fever (knee pain or headache).    . ALPRAZolam (XANAX) 0.25 MG tablet Take 0.25 mg by mouth daily.    Marland Kitchen amLODipine (NORVASC) 5 MG tablet TAKE 1 TABLET (5 MG TOTAL) BY MOUTH DAILY. 30 tablet 11  . Calcium Carbonate-Vitamin D (CALCIUM 600+D) 600-200 MG-UNIT TABS Take 1 tablet by mouth daily.     . fexofenadine (ALLEGRA) 60 MG tablet Take 60 mg by mouth daily.    . GuaiFENesin (MUCINEX CHILDRENS PO) Take 1 Dose by mouth daily as needed (cough).     Marland Kitchen lisinopril (PRINIVIL,ZESTRIL) 20 MG tablet Take 10 mg by mouth daily.    . Multiple Vitamins-Minerals (OCUVITE PO) Take 1 tablet by  mouth daily.     . multivitamin (THERAGRAN) per tablet Take 1 tablet by mouth daily.      . niacin (NIASPAN) 500 MG CR tablet Take 2 tablets (1,000 mg total) by mouth at bedtime. 60 tablet 12  . Nutritional Supplements (ENSURE PO) Take by mouth at bedtime.     . Omega-3 Fatty Acids (FISH OIL) 1200 MG CAPS Take 1 tablet by mouth daily.    Marland Kitchen PARoxetine (PAXIL) 20 MG tablet TAKE 1 TABLET (20 MG TOTAL) BY MOUTH DAILY. 90 tablet 0  . promethazine (PHENERGAN) 25 MG tablet Take 25 mg by mouth as needed. For nausea      . ZETIA 10 MG tablet TAKE 1 TABLET BY MOUTH DAILY. 30 tablet 11   No current facility-administered medications for this visit.    Allergies:   Asa buff (mag; Lipitor; Seroquel; and Zocor    Social History:  The patient  reports that she has never smoked. She does not have any smokeless tobacco history on file. She reports that she does not drink alcohol or use illicit drugs.   Family History:  The patient's family history includes Cancer in her cousin and maternal aunt; Heart disease in her mother; Stroke in her mother and sister.    ROS:  Please see the history of present illness.  Otherwise, review of systems are positive for none.   All other systems are reviewed and negative.    PHYSICAL EXAM: VS:  BP 104/60 mmHg  Pulse 70  Ht 5\' 1"  (1.549 m)  Wt 122 lb 12.8 oz (55.702 kg)  BMI 23.21 kg/m2 , BMI Body mass index is 23.21 kg/(m^2). GEN: Well nourished, well developed, in no acute distress HEENT: normal Neck: no JVD, carotid bruits, or masses Cardiac: RRR; there is a grade 2/6 systolic ejection murmur at the base.  No gallop or rub.  No peripheral edema. Respiratory:  clear to auscultation bilaterally, normal work of breathing GI: soft, nontender, nondistended, + BS MS: no deformity or atrophy Skin: warm and dry, no rash Neuro:  Strength and sensation are intact Psych: euthymic mood, full affect   EKG:  EKG is not ordered today.    Recent Labs: 08/21/2014: ALT  14; BUN 35*; Creatinine, Ser 2.29*; Hemoglobin 11.2*; Platelets 348.0; Potassium 4.1; Sodium 136    Lipid Panel    Component Value Date/Time   CHOL 220* 08/21/2014 0955   TRIG 117.0 08/21/2014 0955   HDL 52.50 08/21/2014 0955   CHOLHDL 4 08/21/2014 0955   VLDL 23.4 08/21/2014 0955   LDLCALC 144* 08/21/2014 0955   LDLDIRECT 164.7 01/31/2013 0809      Wt Readings from Last 3 Encounters:  02/07/15 122 lb 12.8 oz (55.702 kg)  08/21/14 125 lb 6.4 oz (56.881 kg)  05/06/14 123 lb 4 oz (55.906 kg)        ASSESSMENT AND PLAN:  1. hypertensive heart disease without heart failure 2. Dyslipidemia 3. mild aortic valve disease with mild aortic stenosis and mild aortic insufficiency. 4. early dementia but still living in her own home. A family member is there with her at night. 5. Past history of mechanical falls. No evidence of orthostatic hypotension or blood pressure fluctuations.  Plan: Continue same medication.  Recheck in 6 months for  followup office visit lipid panel hepatic function panel and basal metabolic panel.  Following my retirement she will be followed by Dr. Acie Fredrickson   Current medicines are reviewed at length with the patient today.  The patient does not have concerns regarding medicines.  The following changes have been made:  no change  Labs/ tests ordered today include:   Orders Placed This Encounter  Procedures  . Lipid panel  . Hepatic function panel  . Basic metabolic panel      Signed, Darlin Coco MD 02/07/2015 1:43 PM    [facility] [place], [place], [place]  [address] Phone: [phone]; Fax: [phone]

## 2015-02-08 NOTE — Progress Notes (Signed)
Quick Note:  Please report to patient. The recent labs are stable. Continue same medication and careful diet. ______ 

## 2015-02-28 ENCOUNTER — Ambulatory Visit (INDEPENDENT_AMBULATORY_CARE_PROVIDER_SITE_OTHER): Payer: Medicare Other | Admitting: Internal Medicine

## 2015-02-28 ENCOUNTER — Encounter: Payer: Self-pay | Admitting: Internal Medicine

## 2015-02-28 VITALS — BP 158/60 | HR 74 | Temp 97.9°F | Resp 12 | Ht 61.0 in | Wt 122.4 lb

## 2015-02-28 DIAGNOSIS — Z23 Encounter for immunization: Secondary | ICD-10-CM

## 2015-02-28 DIAGNOSIS — I10 Essential (primary) hypertension: Secondary | ICD-10-CM

## 2015-02-28 DIAGNOSIS — F0392 Unspecified dementia, unspecified severity, with psychotic disturbance: Secondary | ICD-10-CM

## 2015-02-28 DIAGNOSIS — N189 Chronic kidney disease, unspecified: Secondary | ICD-10-CM

## 2015-02-28 DIAGNOSIS — F0391 Unspecified dementia with behavioral disturbance: Secondary | ICD-10-CM

## 2015-02-28 DIAGNOSIS — Z9181 History of falling: Secondary | ICD-10-CM | POA: Diagnosis not present

## 2015-02-28 DIAGNOSIS — F0281 Dementia in other diseases classified elsewhere with behavioral disturbance: Secondary | ICD-10-CM

## 2015-02-28 DIAGNOSIS — F411 Generalized anxiety disorder: Secondary | ICD-10-CM

## 2015-02-28 DIAGNOSIS — N183 Chronic kidney disease, stage 3 unspecified: Secondary | ICD-10-CM

## 2015-02-28 NOTE — Progress Notes (Signed)
Pre-visit discussion using our clinic review tool. No additional management support is needed unless otherwise documented below in the visit note.  

## 2015-02-28 NOTE — Progress Notes (Signed)
Subjective:  Patient ID: Cindy Harvey, female    DOB: 01-09-1917  Age: 82 y.o. MRN: [id number]  CC: The primary encounter diagnosis was Need for prophylactic vaccination against Streptococcus pneumoniae (pneumococcus). Diagnoses of Senile dementia with psychosis, History of fall, Essential hypertension, Chronic renal insufficiency, stage III (moderate), and Generalized anxiety disorder were also pertinent to this visit.  HPI Cindy Harvey presents for follow up on hypertension, anxiety, and depression. She has had more supervision since her  son lost his job and is staying home with her.  She has had no falls,  But she has become more sedentary and has not been walking daily .  Appetite good,  Bowels moving regularly.  No bladder incontinence. ..    Outpatient Prescriptions Prior to Visit  Medication Sig Dispense Refill  . acetaminophen (TYLENOL) 160 MG/5ML liquid Take 325 mg by mouth daily as needed for fever (knee pain or headache).    . ALPRAZolam (XANAX) 0.25 MG tablet Take 0.25 mg by mouth daily.    Marland Kitchen amLODipine (NORVASC) 5 MG tablet TAKE 1 TABLET (5 MG TOTAL) BY MOUTH DAILY. 30 tablet 11  . Calcium Carbonate-Vitamin D (CALCIUM 600+D) 600-200 MG-UNIT TABS Take 1 tablet by mouth daily.     . GuaiFENesin (MUCINEX CHILDRENS PO) Take 1 Dose by mouth daily as needed (cough).     Marland Kitchen lisinopril (PRINIVIL,ZESTRIL) 20 MG tablet Take 10 mg by mouth daily.    . Multiple Vitamins-Minerals (OCUVITE PO) Take 1 tablet by mouth daily.     . multivitamin (THERAGRAN) per tablet Take 1 tablet by mouth daily.      . niacin (NIASPAN) 500 MG CR tablet Take 2 tablets (1,000 mg total) by mouth at bedtime. 60 tablet 12  . Nutritional Supplements (ENSURE PO) Take by mouth at bedtime.     . Omega-3 Fatty Acids (FISH OIL) 1200 MG CAPS Take 1 tablet by mouth daily.    Marland Kitchen PARoxetine (PAXIL) 20 MG tablet TAKE 1 TABLET (20 MG TOTAL) BY MOUTH DAILY. 90 tablet 0  . promethazine (PHENERGAN) 25 MG tablet Take 25 mg by mouth  as needed. For nausea      . ZETIA 10 MG tablet TAKE 1 TABLET BY MOUTH DAILY. 30 tablet 11  . fexofenadine (ALLEGRA) 60 MG tablet Take 60 mg by mouth daily.     No facility-administered medications prior to visit.    Review of Systems;  Patient denies headache, fevers, malaise, unintentional weight loss, skin rash, eye pain, sinus congestion and sinus pain, sore throat, dysphagia,  hemoptysis , cough, dyspnea, wheezing, chest pain, palpitations, orthopnea, edema, abdominal pain, nausea, melena, diarrhea, constipation, flank pain, dysuria, hematuria, urinary  Frequency, nocturia, numbness, tingling, seizures,  Focal weakness, Loss of consciousness,  Tremor, insomnia, depression, anxiety, and suicidal ideation.      Objective:  BP 158/60 mmHg  Pulse 74  Temp(Src) 97.9 F (36.6 C) (Oral)  Resp 12  Ht 5\' 1"  (1.549 m)  Wt 122 lb 6 oz (55.509 kg)  BMI 23.13 kg/m2  SpO2 97%  BP Readings from Last 3 Encounters:  02/28/15 158/60  02/07/15 104/60  08/21/14 130/56    Wt Readings from Last 3 Encounters:  02/28/15 122 lb 6 oz (55.509 kg)  02/07/15 122 lb 12.8 oz (55.702 kg)  08/21/14 125 lb 6.4 oz (56.881 kg)    General appearance: alert, cooperative and appears stated age Ears: normal TM's and external ear canals both ears Throat: lips, mucosa, and tongue normal; teeth and  gums normal Neck: no adenopathy, no carotid bruit, supple, symmetrical, trachea midline and thyroid not enlarged, symmetric, no tenderness/mass/nodules Back: symmetric, no curvature. ROM normal. No CVA tenderness. Lungs: clear to auscultation bilaterally Heart: regular rate and rhythm, S1, S2 normal, no murmur, click, rub or gallop Abdomen: soft, non-tender; bowel sounds normal; no masses,  no organomegaly Pulses: 2+ and symmetric Skin: Skin color, texture, turgor normal. No rashes or lesions Lymph nodes: Cervical, supraclavicular, and axillary nodes normal.  No results found for: HGBA1C  Lab Results    Component Value Date   CREATININE 2.23* 02/07/2015   CREATININE 2.29* 08/21/2014   CREATININE 2.1* 02/05/2014    Lab Results  Component Value Date   WBC 9.8 08/21/2014   HGB 11.2* 08/21/2014   HCT 33.3* 08/21/2014   PLT 348.0 08/21/2014   GLUCOSE 83 02/07/2015   CHOL 206* 02/07/2015   TRIG 90 02/07/2015   HDL 58 02/07/2015   LDLDIRECT 164.7 01/31/2013   LDLCALC 130* 02/07/2015   ALT 16 02/07/2015   AST 21 02/07/2015   NA 134* 02/07/2015   K 4.1 02/07/2015   CL 98 02/07/2015   CREATININE 2.23* 02/07/2015   BUN 33* 02/07/2015   CO2 26 02/07/2015   TSH 0.52 09/26/2013    No results found.  Assessment & Plan:   Problem List Items Addressed This Visit    Essential hypertension    Well controlled on current regimen. Renal function stable, no changes today.  Lab Results  Component Value Date   CREATININE 2.23* 02/07/2015   Lab Results  Component Value Date   NA 134* 02/07/2015   K 4.1 02/07/2015   CL 98 02/07/2015   CO2 26 02/07/2015              Chronic renal insufficiency, stage III (moderate)    Discussed avoidance of  NSAIDs, Septra, and contrasted studies to preserve her GFR which is now <  30 ml /min .  Lab Results  Component Value Date   CREATININE 2.23* 02/07/2015           History of fall    She has hd none in at least 6 months  But has become more sedentary.  Gait is steady except on turns.  Encouraged to walk daily for 15 minutes      Generalized anxiety disorder    Managed with SSRI therapy. continue current dose of paxil       Senile dementia with psychosis    She does not appear to have lost any more executive function since last visit, and is well cared for at home by family.  There have been no recent episodes of agitation which appear to be managed with more familial supervision.        Other Visit Diagnoses    Need for prophylactic vaccination against Streptococcus pneumoniae (pneumococcus)    -  Primary    Relevant Orders     Pneumococcal polysaccharide vaccine 23-valent greater than or equal to 2yo subcutaneous/IM (Completed)       I am having Ms. Lebarron maintain her Calcium Carbonate-Vitamin D, multivitamin, Nutritional Supplements (ENSURE PO), promethazine, Multiple Vitamins-Minerals (OCUVITE PO), niacin, fexofenadine, GuaiFENesin (MUCINEX CHILDRENS PO), acetaminophen, ZETIA, amLODipine, PARoxetine, ALPRAZolam, lisinopril, and Fish Oil.  No orders of the defined types were placed in this encounter.    There are no discontinued medications.  Follow-up: No Follow-up on file.   Crecencio Mc, MD

## 2015-02-28 NOTE — Patient Instructions (Addendum)
I want you to walk for 15 minutes every day without stopping,  inside your house   I want you to practice getting up out of your chair 10 times per day   I'll see you in 6 months

## 2015-03-02 NOTE — Assessment & Plan Note (Signed)
Managed with SSRI therapy. continue current dose of paxil

## 2015-03-02 NOTE — Assessment & Plan Note (Signed)
Discussed avoidance of  NSAIDs, Septra, and contrasted studies to preserve her GFR which is now <  30 ml /min .  Lab Results  Component Value Date   CREATININE 2.23* 02/07/2015

## 2015-03-02 NOTE — Assessment & Plan Note (Signed)
Well controlled on current regimen. Renal function stable, no changes today.  Lab Results  Component Value Date   CREATININE 2.23* 02/07/2015   Lab Results  Component Value Date   NA 134* 02/07/2015   K 4.1 02/07/2015   CL 98 02/07/2015   CO2 26 02/07/2015

## 2015-03-02 NOTE — Assessment & Plan Note (Addendum)
She does not appear to have lost any more executive function since last visit, and is well cared for at home by family.  There have been no recent episodes of agitation which appear to be managed with more familial supervision.

## 2015-03-02 NOTE — Assessment & Plan Note (Signed)
She has hd none in at least 6 months  But has become more sedentary.  Gait is steady except on turns.  Encouraged to walk daily for 15 minutes

## 2015-03-31 ENCOUNTER — Telehealth: Payer: Self-pay | Admitting: *Deleted

## 2015-03-31 NOTE — Telephone Encounter (Signed)
called wanting the name brand zetia, showing that we sent it in 09/06/14 with 11 refills, pt said that she was gonna pick up the medication and they wanted to give her the generic and it cost more, she wanted [place] to send in another refill under the brand name, i explained top her that she had refills under the brand name at the pharmacy, she expressed understanding.

## 2015-04-25 ENCOUNTER — Other Ambulatory Visit: Payer: Self-pay | Admitting: Internal Medicine

## 2015-05-11 ENCOUNTER — Emergency Department
Admission: EM | Admit: 2015-05-11 | Discharge: 2015-05-11 | Disposition: A | Discharge status: Home / Self Care | Payer: Medicare Other | Attending: Emergency Medicine | Admitting: Emergency Medicine

## 2015-05-11 ENCOUNTER — Encounter: Payer: Self-pay | Admitting: Emergency Medicine

## 2015-05-11 ENCOUNTER — Emergency Department: Payer: Medicare Other

## 2015-05-11 DIAGNOSIS — I129 Hypertensive chronic kidney disease with stage 1 through stage 4 chronic kidney disease, or unspecified chronic kidney disease: Secondary | ICD-10-CM | POA: Diagnosis not present

## 2015-05-11 DIAGNOSIS — M79662 Pain in left lower leg: Secondary | ICD-10-CM | POA: Diagnosis not present

## 2015-05-11 DIAGNOSIS — Z79899 Other long term (current) drug therapy: Secondary | ICD-10-CM | POA: Diagnosis not present

## 2015-05-11 DIAGNOSIS — M79605 Pain in left leg: Secondary | ICD-10-CM | POA: Insufficient documentation

## 2015-05-11 DIAGNOSIS — M25552 Pain in left hip: Secondary | ICD-10-CM | POA: Diagnosis not present

## 2015-05-11 DIAGNOSIS — N183 Chronic kidney disease, stage 3 (moderate): Secondary | ICD-10-CM | POA: Insufficient documentation

## 2015-05-11 NOTE — ED Notes (Signed)
Pt presents to ED with left leg pain. Pt states leg is "just tired". Pt ambulated in room without difficulty.

## 2015-05-11 NOTE — ED Notes (Signed)
Pt verbalized understanding of discharge instructions. NAD at this time. 

## 2015-05-11 NOTE — ED Notes (Signed)
Pt ambulated per MD Kinner with no difficulty. Advised Pt to use walker at home.

## 2015-05-11 NOTE — ED Notes (Signed)
Patient transported to X-ray 

## 2015-05-11 NOTE — Discharge Instructions (Signed)
Musculoskeletal Pain  Musculoskeletal pain is muscle and boney aches and pains. These pains can occur in any part of the body. Your caregiver [place] treat you without knowing the cause of the pain. They [place] treat you if blood or urine tests, X-rays, and other tests were normal.   CAUSES  There is often not a definite cause or reason for these pains. These pains Valent be caused by a type of germ (virus). The discomfort Espinal also come from overuse. Overuse includes working out too hard when your body is not fit. Boney aches also come from weather changes. Bone is sensitive to atmospheric pressure changes.  HOME CARE INSTRUCTIONS   · Ask when your test results will be ready. Make sure you get your test results.  · Only take over-the-counter or prescription medicines for pain, discomfort, or fever as directed by your caregiver. If you were given medications for your condition, do not drive, operate machinery or power tools, or sign legal documents for 24 hours. Do not drink alcohol. Do not take sleeping pills or other medications that Jefcoat interfere with treatment.  · Continue all activities unless the activities cause more pain. When the pain lessens, slowly resume normal activities. Gradually increase the intensity and duration of the activities or exercise.  · During periods of severe pain, bed rest Seese be helpful. Lay or sit in any position that is comfortable.  · Putting ice on the injured area.    Put ice in a bag.    Place a towel between your skin and the bag.    Leave the ice on for 15 to 20 minutes, 3 to 4 times a day.  · Follow up with your caregiver for continued problems and no reason can be found for the pain. If the pain becomes worse or does not go away, it Bigos be necessary to repeat tests or do additional testing. Your caregiver [place] need to look further for a possible cause.  SEEK IMMEDIATE MEDICAL CARE IF:  · You have pain that is getting worse and is not relieved by medications.  · You develop chest pain  that is associated with shortness or breath, sweating, feeling sick to your stomach (nauseous), or throw up (vomit).  · Your pain becomes localized to the abdomen.  · You develop any new symptoms that seem different or that concern you.  MAKE SURE YOU:   · Understand these instructions.  · Will watch your condition.  · Will get help right away if you are not doing well or get worse.     This information is not intended to replace advice given to you by your health care provider. Make sure you discuss any questions you have with your health care provider.     Document Released: 02/08/2005 Document Revised: 05/03/2011 Document Reviewed: 10/13/2012  Elsevier Interactive Patient Education ©2016 [employer]

## 2015-05-11 NOTE — ED Provider Notes (Signed)
[facility] Emergency Department Provider Note  ____________________________________________    I have reviewed the triage vital signs and the nursing notes.   HISTORY  Chief Complaint Leg Pain  History somewhat limited by dementia.  HPI Cindy Harvey is a 82 y.o. female who presents with vague complaints of leg pain. Upon arrival patient states she feels well and she is not sure why she is here. Her son reports that she complained of sided leg pain this morning and had difficulty standing on her own. Typically she relates without a walker or a cane. No recent falls per son. Apparently she complained to him of pain in the upper left lateral hip area and knee     Past Medical History  Diagnosis Date  . Hypertension   . Hypercholesterolemia   . Osteoarthritis     right knee  . Allergy     allergic rhinitis  . Urinary incontinence   . Macular degeneration   . Hearing loss   . History of shingles   . Pneumonia 2009    with dehydration and electrolyte imbalance    Patient Active Problem List   Diagnosis Date Noted  . Laceration of forehead without complication [id number]  . 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 Generalized anxiety disorder 12/04/2013  . History of fall 09/28/2013  . DNR (do not resuscitate) discussion 09/26/2013  . Hyponatremia 09/26/2013  . Chronic renal insufficiency, stage III (moderate) 01/31/2013  . Heart murmur 07/21/2011  . Post-menopausal 07/14/2010  . Osteoporosis screening 07/14/2010  . Hordeolum 07/07/2010  . Depression (emotion) 05/21/2010  . HYPERLIPIDEMIA 10/24/2008  . Essential hypertension 10/24/2008  . ALLERGIC RHINITIS 10/24/2008  . OSTEOARTHRITIS, KNEE 10/24/2008    Past Surgical History  Procedure Laterality Date  . Tonsillectomy  1928    Current Outpatient Rx  Name  Route  Sig  Dispense  Refill  . acetaminophen (TYLENOL) 160 MG/5ML liquid   Oral   Take 325 mg by mouth daily as needed for  fever (knee pain or headache).         . ALPRAZolam (XANAX) 0.25 MG tablet   Oral   Take 0.25 mg by mouth daily.         Marland Kitchen amLODipine (NORVASC) 5 MG tablet      TAKE 1 TABLET (5 MG TOTAL) BY MOUTH DAILY.   30 tablet   11   . Calcium Carbonate-Vitamin D (CALCIUM 600+D) 600-200 MG-UNIT TABS   Oral   Take 1 tablet by mouth daily.          Marland Kitchen EXPIRED: fexofenadine (ALLEGRA) 60 MG tablet   Oral   Take 60 mg by mouth daily.         . GuaiFENesin (MUCINEX CHILDRENS PO)   Oral   Take 1 Dose by mouth daily as needed (cough).          Marland Kitchen lisinopril (PRINIVIL,ZESTRIL) 20 MG tablet   Oral   Take 10 mg by mouth daily.         . Multiple Vitamins-Minerals (OCUVITE PO)   Oral   Take 1 tablet by mouth daily.          . multivitamin (THERAGRAN) per tablet   Oral   Take 1 tablet by mouth daily.           . niacin (NIASPAN) 500 MG CR tablet   Oral   Take 2 tablets (1,000 mg total) by mouth at bedtime.   60 tablet   12   .  Nutritional Supplements (ENSURE PO)   Oral   Take by mouth at bedtime.          . Omega-3 Fatty Acids (FISH OIL) 1200 MG CAPS   Oral   Take 1 tablet by mouth daily.         Marland Kitchen PARoxetine (PAXIL) 20 MG tablet      TAKE 1 TABLET (20 MG TOTAL) BY MOUTH DAILY.   90 tablet   0   . promethazine (PHENERGAN) 25 MG tablet   Oral   Take 25 mg by mouth as needed. For nausea           . ZETIA 10 MG tablet      TAKE 1 TABLET BY MOUTH DAILY.   30 tablet   11     Allergies Asa buff (mag; Lipitor; Seroquel; and Zocor  Family History  Problem Relation Age of Onset  . Heart disease Mother   . Stroke Mother   . Stroke Sister   . Cancer Maternal Aunt     breast CA  . Cancer Cousin     breast cancer    Social History Social History  Substance Use Topics  . Smoking status: Never Smoker   . Smokeless tobacco: None  . Alcohol Use: No    Review of Systems Limited by dementia  Constitutional: Denies dizziness  ENT: Negative for  neck pain Cardiovascular: Negative for chest pain Respiratory: Negative for shortness of breath. Gastrointestinal: Negative for abdominal pain Genitourinary: Negative for dysuria. Musculoskeletal: Negative for back pain. Skin: Negative for rash. Neurological: Negative for focal weakness Psychiatric: no anxiety    ____________________________________________   PHYSICAL EXAM:  VITAL SIGNS: ED Triage Vitals  Enc Vitals Group     BP 05/11/15 0952 186/69 mmHg     Pulse Rate 05/11/15 0952 82     Resp 05/11/15 0952 18     Temp 05/11/15 0952 97.8 F (36.6 C)     Temp src --      SpO2 05/11/15 0952 96 %     Weight 05/11/15 0952 122 lb 9.2 oz (55.6 kg)     Height 05/11/15 0952 5\' 3"  (1.6 m)     Head Cir --      Peak Flow --      Pain Score --      Pain Loc --      Pain Edu? --      Excl. in [place]? --      Constitutional: Alert. Well appearing and in no distress. Pleasant and interactive Eyes: Conjunctivae are normal. No erythema or injection ENT   Head: Normocephalic and atraumatic.   Mouth/Throat: Mucous membranes are moist.   Respiratory: Normal respiratory effort without tachypnea nor retractions.  Gastrointestinal: Soft and non-tender in all quadrants. No distention. There is no CVA tenderness. Genitourinary: deferred Musculoskeletal: Nontender with normal range of motion in all extremities. Normal range of motion of the left leg, no pain with axial load. 2+ distal pulses. Range of motion of the left knee. No swelling or erythema. Patient ambulates well Neurologic:  Normal speech and language. No gross focal neurologic deficits are appreciated. Skin:  Skin is warm, dry and intact. No rash noted. Psychiatric: Mood and affect are normal. Patient exhibits appropriate insight and judgment.  ____________________________________________    LABS (pertinent positives/negatives)  Labs Reviewed - No data to  display  ____________________________________________   EKG  None  ____________________________________________    RADIOLOGY  No abnormalities on hip or knee x-ray  ____________________________________________   PROCEDURES  Procedure(s) performed: none  Critical Care performed: none  ____________________________________________   INITIAL IMPRESSION / ASSESSMENT AND PLAN / ED COURSE  Pertinent labs & imaging results that were available during my care of the patient were reviewed by me and considered in my medical decision making (see chart for details).  Patient ambulating without difficulty. X-rays are reassuring. She does not have any complaints of pain. We will discharge with PCP follow-up as needed  ____________________________________________   FINAL CLINICAL IMPRESSION(S) / ED DIAGNOSES  Final diagnoses:  Pain of left lower extremity          Lavonia Drafts, MD 05/11/15 1112

## 2015-05-21 ENCOUNTER — Encounter: Payer: Self-pay | Admitting: Cardiovascular Disease

## 2015-05-23 ENCOUNTER — Encounter: Payer: Self-pay | Admitting: Internal Medicine

## 2015-05-23 ENCOUNTER — Ambulatory Visit (INDEPENDENT_AMBULATORY_CARE_PROVIDER_SITE_OTHER): Payer: Medicare Other | Admitting: Internal Medicine

## 2015-05-23 VITALS — BP 104/60 | HR 69 | Temp 98.0°F | Resp 12 | Ht 63.0 in | Wt 122.2 lb

## 2015-05-23 DIAGNOSIS — M1710 Unilateral primary osteoarthritis, unspecified knee: Secondary | ICD-10-CM

## 2015-05-23 DIAGNOSIS — IMO0002 Reserved for concepts with insufficient information to code with codable children: Secondary | ICD-10-CM

## 2015-05-23 DIAGNOSIS — M179 Osteoarthritis of knee, unspecified: Secondary | ICD-10-CM

## 2015-05-23 MED ORDER — MELOXICAM 7.5 MG PO TABS: 7.5000 mg | ORAL_TABLET | Freq: Every day | ORAL | Status: DC

## 2015-05-23 NOTE — Progress Notes (Signed)
Subjective:  Patient ID: Cindy Harvey, female    DOB: 25-Feb-1916  Age: 82 y.o. MRN: [id number]  CC: The encounter diagnosis was Osteoarthrosis involving lower leg.  HPI Cindy Harvey presents for ER follow up on left leg pain . She is accompanied by her daughter who provides the history.  Patient had been complaining  of intermittent pain for several days and the complaints became more significant so patient' s son decided to take her to ER.  Imaging of pelvis/hips and knee were done but the patient was released and the family is unaware of any diagnosis of cause. Reports were  reviewed with patient and daughter today. Symmetric narrowing of both hip joints and in the left knee in medial and patellofemoral compartments with trace effusion noted.    Her pain is brought on by deep flexion of the left hip, particularly in getting out of the car, but not out of the family's  SUV.  Patient denies pain currently.      Outpatient Prescriptions Prior to Visit  Medication Sig Dispense Refill  . acetaminophen (TYLENOL) 160 MG/5ML liquid Take 325 mg by mouth daily as needed for fever (knee pain or headache).    Marland Kitchen amLODipine (NORVASC) 5 MG tablet TAKE 1 TABLET (5 MG TOTAL) BY MOUTH DAILY. 30 tablet 11  . Calcium Carbonate-Vitamin D (CALCIUM 600+D) 600-200 MG-UNIT TABS Take 1 tablet by mouth daily.     . GuaiFENesin (MUCINEX CHILDRENS PO) Take 1 Dose by mouth daily as needed (cough).     Marland Kitchen lisinopril (PRINIVIL,ZESTRIL) 20 MG tablet Take 10 mg by mouth daily.    . Multiple Vitamins-Minerals (OCUVITE PO) Take 1 tablet by mouth daily.     . multivitamin (THERAGRAN) per tablet Take 1 tablet by mouth daily.      . niacin (NIASPAN) 500 MG CR tablet Take 2 tablets (1,000 mg total) by mouth at bedtime. 60 tablet 12  . Nutritional Supplements (ENSURE PO) Take by mouth at bedtime.     . Omega-3 Fatty Acids (FISH OIL) 1200 MG CAPS Take 1 tablet by mouth daily.    Marland Kitchen PARoxetine (PAXIL) 20 MG tablet TAKE 1 TABLET (20  MG TOTAL) BY MOUTH DAILY. 90 tablet 0  . promethazine (PHENERGAN) 25 MG tablet Take 25 mg by mouth as needed. For nausea      . ZETIA 10 MG tablet TAKE 1 TABLET BY MOUTH DAILY. 30 tablet 11  . ALPRAZolam (XANAX) 0.25 MG tablet Take 0.25 mg by mouth daily. Reported on 05/23/2015    . fexofenadine (ALLEGRA) 60 MG tablet Take 60 mg by mouth daily.     No facility-administered medications prior to visit.    Review of Systems;  Patient denies headache, fevers, malaise, unintentional weight loss, skin rash, eye pain, sinus congestion and sinus pain, sore throat, dysphagia,  hemoptysis , cough, dyspnea, wheezing, chest pain, palpitations, orthopnea, edema, abdominal pain, nausea, melena, diarrhea, constipation, flank pain, dysuria, hematuria, urinary  Frequency, nocturia, numbness, tingling, seizures,  Focal weakness, Loss of consciousness,  Tremor, insomnia, depression, anxiety, and suicidal ideation.      Objective:  BP 104/60 mmHg  Pulse 69  Temp(Src) 98 F (36.7 C) (Oral)  Resp 12  Ht 5\' 3"  (1.6 m)  Wt 122 lb 4 oz (55.452 kg)  BMI 21.66 kg/m2  SpO2 96%  BP Readings from Last 3 Encounters:  05/23/15 104/60  05/11/15 150/61  02/28/15 158/60    Wt Readings from Last 3 Encounters:  05/23/15 122  lb 4 oz (55.452 kg)  05/11/15 122 lb 9.2 oz (55.6 kg)  02/28/15 122 lb 6 oz (55.509 kg)    General appearance: alert, cooperative and appears stated age Neck: no adenopathy, no carotid bruit, supple, symmetrical, trachea midline and thyroid not enlarged, symmetric, no tenderness/mass/nodules Back: symmetric, no curvature. ROM normal. No CVA tenderness. Lungs: clear to auscultation bilaterally Heart: regular rate and rhythm, S1, S2 normal, no murmur, click, rub or gallop Abdomen: soft, non-tender; bowel sounds normal; no masses,  no organomegaly Pulses: 2+ and symmetric Skin: Skin color, texture, turgor normal. No rashes or lesions Lymph nodes: Cervical, supraclavicular, and axillary  nodes normal. MSK: Hips with normal ROM, some pain with internal rotation and full flexion on the left. No spine tenderness on palpation.  Mild crepitus left knee without effusion or point tenderness.  Gait norma;.    No results found for: HGBA1C  Lab Results  Component Value Date   CREATININE 2.23* 02/07/2015   CREATININE 2.29* 08/21/2014   CREATININE 2.1* 02/05/2014    Lab Results  Component Value Date   WBC 9.8 08/21/2014   HGB 11.2* 08/21/2014   HCT 33.3* 08/21/2014   PLT 348.0 08/21/2014   GLUCOSE 83 02/07/2015   CHOL 206* 02/07/2015   TRIG 90 02/07/2015   HDL 58 02/07/2015   LDLDIRECT 164.7 01/31/2013   LDLCALC 130* 02/07/2015   ALT 16 02/07/2015   AST 21 02/07/2015   NA 134* 02/07/2015   K 4.1 02/07/2015   CL 98 02/07/2015   CREATININE 2.23* 02/07/2015   BUN 33* 02/07/2015   CO2 26 02/07/2015   TSH 0.52 09/26/2013    Dg Knee Complete 4 Views Left  05/11/2015  CLINICAL DATA:  Pt to ED c/o left lateral hip tenderness today, no known injury, states her left leg "is tired." Hip pain/tenderness is worse with internal rotation. EXAM: LEFT KNEE - COMPLETE 4+ VIEW COMPARISON:  05/02/2014 FINDINGS: Degenerate changes are seen in the medial and patellofemoral compartments. Trace joint effusion. No acute fracture or subluxation. IMPRESSION: No evidence for acute  abnormality. Electronically Signed   By: Nolon Nations M.D.   On: 05/11/2015 10:51   Dg Hip Unilat With Pelvis 2-3 Views Left  05/11/2015  CLINICAL DATA:  Left hip region tenderness. No history of recent trauma EXAM: DG HIP (WITH OR WITHOUT PELVIS) 2-3V LEFT COMPARISON:  None. FINDINGS: Frontal pelvis as well as frontal and lateral left hip images were obtained. There is no demonstrable fracture or dislocation. There is moderate symmetric narrowing of both hip joints. No erosive change. Bones are osteoporotic. IMPRESSION: No demonstrable fracture or dislocation. Moderate symmetric narrowing both hip joints. Bones  osteoporotic. Electronically Signed   By: Lowella Grip III M.D.   On: 05/11/2015 10:50    Assessment & Plan:   Problem List Items Addressed This Visit    Osteoarthrosis involving lower leg - Primary    Involving knee and hip, complicated by dementia. ER records reviewed with patient's daughter. Trial of Tylenol 500 mg q 6 hrs and meloxicam 7.5 mg daily        A total of 25 minutes of face to face time was spent with patient more than half of which was spent in counselling about the above mentioned conditions  and coordination of care   I am having Ms. Episcopo start on meloxicam. I am also having her maintain her Calcium Carbonate-Vitamin D, multivitamin, Nutritional Supplements (ENSURE PO), promethazine, Multiple Vitamins-Minerals (OCUVITE PO), niacin, fexofenadine, GuaiFENesin (MUCINEX CHILDRENS PO),  acetaminophen, ZETIA, amLODipine, ALPRAZolam, lisinopril, Fish Oil, and PARoxetine.  Meds ordered this encounter  Medications  . meloxicam (MOBIC) 7.5 MG tablet    Sig: Take 1 tablet (7.5 mg total) by mouth daily.    Dispense:  30 tablet    Refill:  5    There are no discontinued medications.  Follow-up: Return in about 3 months (around 08/22/2015) for follow up arthritis, non fasting labs .   Crecencio Mc, MD

## 2015-05-23 NOTE — Progress Notes (Signed)
Pre-visit discussion using our clinic review tool. No additional management support is needed unless otherwise documented below in the visit note.  

## 2015-05-23 NOTE — Patient Instructions (Signed)
Ms Cindy Harvey has degenerative arthritis in both hips and left knee.  This is causing her to cry out in pain especially after being inactive.   Let's treat her pain with the following regimen:   2000 mg tylenol daily ,  In divided doses.   maximum daily dose.   Adding once daily meloxicam 7.5 mg (anti inflammatory) .  DO NOT TAKE ALEVE OR MOTRIN WITH IT   We can always add something stronger if needed her and there (for nighttime pain )   Encourage activity!

## 2015-05-25 NOTE — Assessment & Plan Note (Signed)
Involving knee and hip, complicated by dementia. ER records reviewed with patient's daughter. Trial of Tylenol 500 mg q 6 hrs and meloxicam 7.5 mg daily

## 2015-07-21 ENCOUNTER — Other Ambulatory Visit: Payer: Self-pay | Admitting: Internal Medicine

## 2015-07-22 NOTE — Telephone Encounter (Signed)
Pt requesting a refill. Last filled on 04/25/15, pt was seen on 05/23/15 for an ER follow up. Okay to refill?

## 2015-07-23 NOTE — Telephone Encounter (Signed)
REFILLED

## 2015-07-26 ENCOUNTER — Other Ambulatory Visit: Payer: Self-pay | Admitting: Internal Medicine

## 2015-07-30 ENCOUNTER — Ambulatory Visit (INDEPENDENT_AMBULATORY_CARE_PROVIDER_SITE_OTHER): Payer: Medicare Other | Admitting: Cardiovascular Disease

## 2015-07-30 ENCOUNTER — Encounter: Payer: Self-pay | Admitting: Cardiovascular Disease

## 2015-07-30 VITALS — BP 158/52 | HR 73 | Ht 63.0 in | Wt 116.8 lb

## 2015-07-30 DIAGNOSIS — I350 Nonrheumatic aortic (valve) stenosis: Secondary | ICD-10-CM | POA: Diagnosis not present

## 2015-07-30 DIAGNOSIS — I10 Essential (primary) hypertension: Secondary | ICD-10-CM

## 2015-07-30 NOTE — Progress Notes (Signed)
Cardiology Office Note   Date:  07/30/2015   ID:  Cindy Harvey, DOB [birth year], MRN [id number]  PCP:  Crecencio Mc, MD  Cardiologist: Darlin Coco MD  Chief Complaint  Patient presents with  . Hypertension   Problem List 1. HTN 2. Aortic stenosis    History of Present Illness: Cindy Harvey is a 82 y.o. female who presents for a six-month follow-up office visit.  She has a history of high blood pressure and high cholesterol. She does not have any history of ischemic heart disease. She has had some problems with poor balance. She has been feeling well with no new complaints today. She has a known mild heart murmur and had an echocardiogram 07/27/11 showed an ejection fraction of 55-60% and a mild aortic stenosis with mild aortic insufficiency. Her daughter states that the patient has a history of seasonal affective disorder.  July 30, 2015:  Seen with Cindy Harvey   ( daughter )  ( Bob's wife )    Past Medical History  Diagnosis Date  . Hypertension   . Hypercholesterolemia   . Osteoarthritis     right knee  . Allergy     allergic rhinitis  . Urinary incontinence   . Macular degeneration   . Hearing loss   . History of shingles   . Pneumonia 2009    with dehydration and electrolyte imbalance    Past Surgical History  Procedure Laterality Date  . Tonsillectomy  1928     Current Outpatient Prescriptions  Medication Sig Dispense Refill  . acetaminophen (TYLENOL) 160 MG/5ML liquid Take 325 mg by mouth daily as needed for fever (knee pain or headache).    . ALPRAZolam (XANAX) 0.25 MG tablet Take 0.25 mg by mouth daily. Reported on 05/23/2015    . amLODipine (NORVASC) 5 MG tablet TAKE 1 TABLET (5 MG TOTAL) BY MOUTH DAILY. 30 tablet 11  . Calcium Carbonate-Vitamin D (CALCIUM 600+D) 600-200 MG-UNIT TABS Take 1 tablet by mouth daily.     . fexofenadine (ALLEGRA) 60 MG tablet Take 60 mg by mouth daily.    . GuaiFENesin (MUCINEX CHILDRENS PO) Take 1 Dose by  mouth daily as needed (cough).     Marland Kitchen lisinopril (PRINIVIL,ZESTRIL) 20 MG tablet Take 10 mg by mouth daily.    . meloxicam (MOBIC) 7.5 MG tablet Take 1 tablet (7.5 mg total) by mouth daily. 30 tablet 5  . Multiple Vitamins-Minerals (OCUVITE PO) Take 1 tablet by mouth daily.     . multivitamin (THERAGRAN) per tablet Take 1 tablet by mouth daily.      . niacin (NIASPAN) 500 MG CR tablet Take 2 tablets (1,000 mg total) by mouth at bedtime. 60 tablet 12  . Nutritional Supplements (ENSURE PO) Take by mouth at bedtime.     . Omega-3 Fatty Acids (FISH OIL) 1200 MG CAPS Take 1 tablet by mouth daily.    Marland Kitchen PARoxetine (PAXIL) 20 MG tablet TAKE 1 TABLET (20 MG TOTAL) BY MOUTH DAILY. 90 tablet 1  . PARoxetine (PAXIL) 20 MG tablet TAKE 1 TABLET (20 MG TOTAL) BY MOUTH DAILY. 90 tablet 1  . promethazine (PHENERGAN) 25 MG tablet Take 25 mg by mouth as needed. For nausea      . ZETIA 10 MG tablet TAKE 1 TABLET BY MOUTH DAILY. 30 tablet 11   No current facility-administered medications for this visit.    Allergies:   Asa buff (mag; Lipitor; Seroquel; and Zocor    Social History:  The patient  reports that she has never smoked. She does not have any smokeless tobacco history on file. She reports that she does not drink alcohol or use illicit drugs.   Family History:  The patient's family history includes Cancer in her cousin and maternal aunt; Heart disease in her mother; Stroke in her mother and sister.    ROS:  Please see the history of present illness.   Otherwise, review of systems are positive for none.   All other systems are reviewed and negative.    PHYSICAL EXAM: VS:  BP 158/52 mmHg  Pulse 73  Ht 5\' 3"  (1.6 m)  Wt 116 lb 12.8 oz (52.98 kg)  BMI 20.70 kg/m2 , BMI Body mass index is 20.7 kg/(m^2). GEN: Well nourished, well developed, in no acute distress HEENT: normal Neck: no JVD, carotid bruits, or masses Cardiac: RRR; there is a grade 2/6 systolic ejection murmur at the base.  No gallop or  rub.  No peripheral edema. Respiratory:  clear to auscultation bilaterally, normal work of breathing GI: soft, nontender, nondistended, + BS MS: no deformity or atrophy Skin: warm and dry, no rash Neuro:  Strength and sensation are intact Psych: euthymic mood, full affect   EKG:  EKG is ordered today. Sinus rhythm at 73.   PACs.    Recent Labs: 08/21/2014: Hemoglobin 11.2*; Platelets 348.0 02/07/2015: ALT 16; BUN 33*; Creat 2.23*; Potassium 4.1; Sodium 134*    Lipid Panel    Component Value Date/Time   CHOL 206* 02/07/2015 1011   TRIG 90 02/07/2015 1011   HDL 58 02/07/2015 1011   CHOLHDL 3.6 02/07/2015 1011   VLDL 18 02/07/2015 1011   LDLCALC 130* 02/07/2015 1011   LDLDIRECT 164.7 01/31/2013 0809      Wt Readings from Last 3 Encounters:  07/30/15 116 lb 12.8 oz (52.98 kg)  05/23/15 122 lb 4 oz (55.452 kg)  05/11/15 122 lb 9.2 oz (55.6 kg)     ASSESSMENT AND PLAN:  1. hypertensive heart disease without heart failure - BP is stable.  2. Dyslipidemia 3. mild aortic valve disease with mild aortic stenosis and mild aortic insufficiency. 4. early dementia  5. Past history of mechanical falls. No evidence of orthostatic hypotension or blood pressure fluctuations.  I will see her in 1 year   Current medicines are reviewed at length with the patient today.  The patient does not have concerns regarding medicines.  The following changes have been made:  no change  Labs/ tests ordered today include:   No orders of the defined types were placed in this encounter.      Mertie Moores, MD  07/30/2015 11:44 AM    Cindy Harvey,  [address] [place], [place]  [address] Pager [phone] Phone: [phone]; Fax: [phone]

## 2015-07-30 NOTE — Patient Instructions (Signed)

## 2015-08-19 DIAGNOSIS — Z961 Presence of intraocular lens: Secondary | ICD-10-CM | POA: Diagnosis not present

## 2015-08-19 DIAGNOSIS — D3132 Benign neoplasm of left choroid: Secondary | ICD-10-CM | POA: Diagnosis not present

## 2015-08-19 DIAGNOSIS — H353134 Nonexudative age-related macular degeneration, bilateral, advanced atrophic with subfoveal involvement: Secondary | ICD-10-CM | POA: Diagnosis not present

## 2015-08-19 DIAGNOSIS — H43813 Vitreous degeneration, bilateral: Secondary | ICD-10-CM | POA: Diagnosis not present

## 2015-08-20 ENCOUNTER — Ambulatory Visit: Payer: Self-pay | Admitting: Cardiovascular Disease

## 2015-08-21 ENCOUNTER — Encounter: Payer: Self-pay | Admitting: Internal Medicine

## 2015-08-21 ENCOUNTER — Ambulatory Visit (INDEPENDENT_AMBULATORY_CARE_PROVIDER_SITE_OTHER): Payer: Medicare Other | Admitting: Internal Medicine

## 2015-08-21 VITALS — BP 138/60 | HR 75 | Temp 97.5°F | Resp 12 | Ht 61.25 in | Wt 119.8 lb

## 2015-08-21 DIAGNOSIS — F0392 Unspecified dementia, unspecified severity, with psychotic disturbance: Secondary | ICD-10-CM

## 2015-08-21 DIAGNOSIS — N183 Chronic kidney disease, stage 3 unspecified: Secondary | ICD-10-CM

## 2015-08-21 DIAGNOSIS — F0281 Dementia in other diseases classified elsewhere with behavioral disturbance: Secondary | ICD-10-CM | POA: Diagnosis not present

## 2015-08-21 DIAGNOSIS — Z0000 Encounter for general adult medical examination without abnormal findings: Secondary | ICD-10-CM

## 2015-08-21 DIAGNOSIS — F0391 Unspecified dementia with behavioral disturbance: Secondary | ICD-10-CM

## 2015-08-21 DIAGNOSIS — E559 Vitamin D deficiency, unspecified: Secondary | ICD-10-CM | POA: Diagnosis not present

## 2015-08-21 DIAGNOSIS — F411 Generalized anxiety disorder: Secondary | ICD-10-CM | POA: Diagnosis not present

## 2015-08-21 LAB — COMPREHENSIVE METABOLIC PANEL
ALBUMIN: 4 g/dL (ref 3.5–5.2)
ALK PHOS: 70 U/L (ref 39–117)
ALT: 16 U/L (ref 0–35)
AST: 23 U/L (ref 0–37)
BUN: 44 mg/dL — AB (ref 6–23)
CO2: 28 mEq/L (ref 19–32)
CREATININE: 2.48 mg/dL — AB (ref 0.40–1.20)
Calcium: 10 mg/dL (ref 8.4–10.5)
Chloride: 102 mEq/L (ref 96–112)
GFR: 19.14 mL/min — ABNORMAL LOW (ref >60.00)
Glucose, Bld: 82 mg/dL (ref 70–99)
POTASSIUM: 4.5 meq/L (ref 3.5–5.1)
SODIUM: 138 meq/L (ref 135–145)
TOTAL PROTEIN: 7.1 g/dL (ref 6.0–8.3)
Total Bilirubin: 0.4 mg/dL (ref 0.2–1.2)

## 2015-08-21 LAB — VITAMIN D 25 HYDROXY (VIT D DEFICIENCY, FRACTURES): VITD: 50.9 ng/mL (ref 30.00–100.00)

## 2015-08-21 NOTE — Progress Notes (Signed)
Pre-visit discussion using our clinic review tool. No additional management support is needed unless otherwise documented below in the visit note.  

## 2015-08-21 NOTE — Progress Notes (Signed)
Patient ID: Cindy Harvey, female    DOB: 04-24-1916  Age: 82 y.o. MRN: [id number]  The patient is here for annual Medicare wellness examination and management of other chronic and acute problems.   The risk factors are reflected in the social history.  The roster of all physicians providing medical care to patient - is listed in the Snapshot section of the chart.  Activities of daily living:  The patient is NOT 100% independent in all ADLs: dressing, toileting, feeding as well as independent mobility  Home safety : The patient has smoke detectors in the home. They wear seatbelts.  There are no firearms at home. There is no violence in the home.   There is no risks for hepatitis, STDs or HIV. There is no   history of blood transfusion. They have no travel history to infectious disease endemic areas of the world.  The patient has seen their dentist in the last six month. They have seen their eye doctor in the last year. They admit to slight hearing difficulty with regard to whispered voices and some television programs.  They have deferred audiologic testing in the last year.  They do not  have excessive sun exposure. Discussed the need for sun protection: hats, long sleeves and use of sunscreen if there is significant sun exposure.   Diet: the importance of a healthy diet is discussed. They do have a healthy diet.  The benefits of regular aerobic exercise were discussed. She walks 4 times per week ,  20 minutes.   Depression screen: there are no signs or vegative symptoms of depression- irritability, change in appetite, anhedonia, sadness/tearfullness.  Cognitive assessment: the patient DOES NOT MANAGE  their financial and personal affairs and is NOT actively engaged. SHE HAS DEMENTIA AND HAS CAREGIVERS AND FAMILY SUPERVISION .  The following portions of the patient's history were reviewed and updated as appropriate: allergies, current medications, past family history, past medical history,   past surgical history, past social history  and problem list.  Visual acuity was not assessed per patient preference since she has regular follow up with her ophthalmologist. Hearing and body mass index were assessed and reviewed.   During the course of the visit the patient was educated and counseled about appropriate screening and preventive services including : fall prevention , diabetes screening, nutrition counseling, colorectal cancer screening, and recommended immunizations.    CC: The primary encounter diagnosis was CKD (chronic kidney disease), stage III. Diagnoses of Vitamin D deficiency, Senile dementia with psychosis, Generalized anxiety disorder, and Medicare annual wellness visit, subsequent were also pertinent to this visit.  History Cindy Harvey has a past medical history of Hypertension; Hypercholesterolemia; Osteoarthritis; Allergy; Urinary incontinence; Macular degeneration; Hearing loss; History of shingles; and Pneumonia (2009).   She has past surgical history that includes Tonsillectomy (1928).   Her family history includes Cancer in her cousin and maternal aunt; Heart disease in her mother; Stroke in her mother and sister.She reports that she has never smoked. She does not have any smokeless tobacco history on file. She reports that she does not drink alcohol or use illicit drugs.  Outpatient Prescriptions Prior to Visit  Medication Sig Dispense Refill  . acetaminophen (TYLENOL) 160 MG/5ML liquid Take 325 mg by mouth daily as needed for fever (knee pain or headache).    Marland Kitchen amLODipine (NORVASC) 5 MG tablet TAKE 1 TABLET (5 MG TOTAL) BY MOUTH DAILY. 30 tablet 11  . Calcium Carbonate-Vitamin D (CALCIUM 600+D) 600-200 MG-UNIT TABS Take  1 tablet by mouth daily.     . GuaiFENesin (MUCINEX CHILDRENS PO) Take 1 Dose by mouth daily as needed (cough).     Marland Kitchen lisinopril (PRINIVIL,ZESTRIL) 20 MG tablet Take 10 mg by mouth daily.    . meloxicam (MOBIC) 7.5 MG tablet Take 1 tablet (7.5 mg  total) by mouth daily. 30 tablet 5  . Multiple Vitamins-Minerals (OCUVITE PO) Take 1 tablet by mouth daily.     . multivitamin (THERAGRAN) per tablet Take 1 tablet by mouth daily.      . niacin (NIASPAN) 500 MG CR tablet Take 2 tablets (1,000 mg total) by mouth at bedtime. 60 tablet 12  . Nutritional Supplements (ENSURE PO) Take by mouth at bedtime.     . Omega-3 Fatty Acids (FISH OIL) 1200 MG CAPS Take 1 tablet by mouth daily.    Marland Kitchen PARoxetine (PAXIL) 20 MG tablet TAKE 1 TABLET (20 MG TOTAL) BY MOUTH DAILY. 90 tablet 1  . PARoxetine (PAXIL) 20 MG tablet TAKE 1 TABLET (20 MG TOTAL) BY MOUTH DAILY. 90 tablet 1  . promethazine (PHENERGAN) 25 MG tablet Take 25 mg by mouth as needed. For nausea      . ZETIA 10 MG tablet TAKE 1 TABLET BY MOUTH DAILY. 30 tablet 11  . ALPRAZolam (XANAX) 0.25 MG tablet Take 0.25 mg by mouth daily. Reported on 05/23/2015    . fexofenadine (ALLEGRA) 60 MG tablet Take 60 mg by mouth daily.     No facility-administered medications prior to visit.    Review of Systems   Patient denies headache, fevers, malaise, unintentional weight loss, skin rash, eye pain, sinus congestion and sinus pain, sore throat, dysphagia,  hemoptysis , cough, dyspnea, wheezing, chest pain, palpitations, orthopnea, edema, abdominal pain, nausea, melena, diarrhea, constipation, flank pain, dysuria, hematuria, urinary  Frequency, nocturia, numbness, tingling, seizures,  Focal weakness, Loss of consciousness,  Tremor, insomnia, depression, anxiety, and suicidal ideation.      Objective:  BP 138/60 mmHg  Pulse 75  Temp(Src) 97.5 F (36.4 C) (Oral)  Resp 12  Ht 5' 1.25" (1.556 m)  Wt 119 lb 12 oz (54.318 kg)  BMI 22.43 kg/m2  SpO2 96%  Physical Exam   General appearance: alert, cooperative and appears stated age Ears: normal TM's and external ear canals both ears Throat: lips, mucosa, and tongue normal; teeth and gums normal Neck: no adenopathy, no carotid bruit, supple, symmetrical,  trachea midline and thyroid not enlarged, symmetric, no tenderness/mass/nodules Back: symmetric, no curvature. ROM normal. No CVA tenderness. Lungs: clear to auscultation bilaterally Heart: regular rate and rhythm, S1, S2 normal, no murmur, click, rub or gallop Abdomen: soft, non-tender; bowel sounds normal; no masses,  no organomegaly Pulses: 2+ and symmetric Skin: Skin color, texture, turgor normal. No rashes or lesions Lymph nodes: Cervical, supraclavicular, and axillary nodes normal.    Assessment & Plan:   Problem List Items Addressed This Visit    Generalized anxiety disorder    Managed with SSRI therapy. continue current dose of paxil         Senile dementia with psychosis    She does not appear to have lost any more executive function since last visit, and is well cared for at home by family.  There have been no recent episodes of agitation which appear to be managed with more familial supervision.         Medicare annual wellness visit, subsequent    Annual Medicare wellness  exam was done as well as a comprehensive  physical exam and management of acute and chronic conditions .  During the course of the visit the patient was educated and counseled about appropriate screening and preventive services including : fall prevention , diabetes screening, nutrition counseling, colorectal cancer screening, and recommended immunizations.  Printed recommendations for health maintenance screenings was given.        Other Visit Diagnoses    CKD (chronic kidney disease), stage III    -  Primary    Relevant Orders    Comp Met (CMET) (Completed)    Vitamin D deficiency        Relevant Orders    VITAMIN D 25 Hydroxy (Vit-D Deficiency, Fractures) (Completed)       I have discontinued Ms. Mccue's ALPRAZolam. I am also having her maintain her Calcium Carbonate-Vitamin D, multivitamin, Nutritional Supplements (ENSURE PO), promethazine, Multiple Vitamins-Minerals (OCUVITE PO), niacin,  fexofenadine, GuaiFENesin (MUCINEX CHILDRENS PO), acetaminophen, ZETIA, amLODipine, lisinopril, Fish Oil, meloxicam, PARoxetine, and PARoxetine.  No orders of the defined types were placed in this encounter.    Medications Discontinued During This Encounter  Medication Reason  . ALPRAZolam (XANAX) 0.25 MG tablet     Follow-up: Return in about 6 months (around 02/20/2016).   Crecencio Mc, MD

## 2015-08-21 NOTE — Patient Instructions (Signed)
YOU HAVE GAINED  3 LBS !  THIS IS WONDERFUL!!  YOU CAN HAVE A MILK SHAKE EVERY DAY.  ADD A PROTEIN SHAKE OR A BOOST TO THE ICE CREAM TO INCREAES THE PROTEIN   Menopause is a normal process in which your reproductive ability comes to an end. This process happens gradually over a span of months to years, usually between the ages of 80 and 32. Menopause is complete when you have missed 12 consecutive menstrual periods. It is important to talk with your health care provider about some of the most common conditions that affect postmenopausal women, such as heart disease, cancer, and bone loss (osteoporosis). Adopting a healthy lifestyle and getting preventive care can help to promote your health and wellness. Those actions can also lower your chances of developing some of these common conditions. WHAT SHOULD I KNOW ABOUT MENOPAUSE? During menopause, you Haston experience a number of symptoms, such as:  Moderate-to-severe hot flashes.  Night sweats.  Decrease in sex drive.  Mood swings.  Headaches.  Tiredness.  Irritability.  Memory problems.  Insomnia. Choosing to treat or not to treat menopausal changes is an individual decision that you make with your health care provider. WHAT SHOULD I KNOW ABOUT HORMONE REPLACEMENT THERAPY AND SUPPLEMENTS? Hormone therapy products are effective for treating symptoms that are associated with menopause, such as hot flashes and night sweats. Hormone replacement carries certain risks, especially as you become older. If you are thinking about using estrogen or estrogen with progestin treatments, discuss the benefits and risks with your health care provider. WHAT SHOULD I KNOW ABOUT HEART DISEASE AND STROKE? Heart disease, heart attack, and stroke become more likely as you age. This Mazurkiewicz be due, in part, to the hormonal changes that your body experiences during menopause. These can affect how your body processes dietary fats, triglycerides, and cholesterol. Heart  attack and stroke are both medical emergencies. There are many things that you can do to help prevent heart disease and stroke:  Have your blood pressure checked at least every 1-2 years. High blood pressure causes heart disease and increases the risk of stroke.  If you are 80-6 years old, ask your health care provider if you should take aspirin to prevent a heart attack or a stroke.  Do not use any tobacco products, including cigarettes, chewing tobacco, or electronic cigarettes. If you need help quitting, ask your health care provider.  It is important to eat a healthy diet and maintain a healthy weight.  Be sure to include plenty of vegetables, fruits, low-fat dairy products, and lean protein.  Avoid eating foods that are high in solid fats, added sugars, or salt (sodium).  Get regular exercise. This is one of the most important things that you can do for your health.  Try to exercise for at least 150 minutes each week. The type of exercise that you do should increase your heart rate and make you sweat. This is known as moderate-intensity exercise.  Try to do strengthening exercises at least twice each week. Do these in addition to the moderate-intensity exercise.  Know your numbers.Ask your health care provider to check your cholesterol and your blood glucose. Continue to have your blood tested as directed by your health care provider. WHAT SHOULD I KNOW ABOUT CANCER SCREENING? There are several types of cancer. Take the following steps to reduce your risk and to catch any cancer development as early as possible. Breast Cancer  Practice breast self-awareness.  This means understanding how your  breasts normally appear and feel.  It also means doing regular breast self-exams. Let your health care provider know about any changes, no matter how small.  If you are 80 or older, have a clinician do a breast exam (clinical breast exam or CBE) every year. Depending on your age, family  history, and medical history, it Lupi be recommended that you also have a yearly breast X-ray (mammogram).  If you have a family history of breast cancer, talk with your health care provider about genetic screening.  If you are at high risk for breast cancer, talk with your health care provider about having an MRI and a mammogram every year.  Breast cancer (BRCA) gene test is recommended for women who have family members with BRCA-related cancers. Results of the assessment will determine the need for genetic counseling and BRCA1 and for BRCA2 testing. BRCA-related cancers include these types:  Breast. This occurs in males or females.  Ovarian.  Tubal. This Teuscher also be called fallopian tube cancer.  Cancer of the abdominal or pelvic lining (peritoneal cancer).  Prostate.  Pancreatic. Cervical, Uterine, and Ovarian Cancer Your health care provider Lighty recommend that you be screened regularly for cancer of the pelvic organs. These include your ovaries, uterus, and vagina. This screening involves a pelvic exam, which includes checking for microscopic changes to the surface of your cervix (Pap test).  For women ages 21-65, health care providers Simic recommend a pelvic exam and a Pap test every three years. For women ages 17-65, they Sproull recommend the Pap test and pelvic exam, combined with testing for human papilloma virus (HPV), every five years. Some types of HPV increase your risk of cervical cancer. Testing for HPV Bourbon also be done on women of any age who have unclear Pap test results.  Other health care providers Hodgdon not recommend any screening for nonpregnant women who are considered low risk for pelvic cancer and have no symptoms. Ask your health care provider if a screening pelvic exam is right for you.  If you have had past treatment for cervical cancer or a condition that could lead to cancer, you need Pap tests and screening for cancer for at least 20 years after your treatment. If Pap  tests have been discontinued for you, your risk factors (such as having a new sexual partner) need to be reassessed to determine if you should start having screenings again. Some women have medical problems that increase the chance of getting cervical cancer. In these cases, your health care provider Semple recommend that you have screening and Pap tests more often.  If you have a family history of uterine cancer or ovarian cancer, talk with your health care provider about genetic screening.  If you have vaginal bleeding after reaching menopause, tell your health care provider.  There are currently no reliable tests available to screen for ovarian cancer. Lung Cancer Lung cancer screening is recommended for adults 61-41 years old who are at high risk for lung cancer because of a history of smoking. A yearly low-dose CT scan of the lungs is recommended if you:  Currently smoke.  Have a history of at least 30 pack-years of smoking and you currently smoke or have quit within the past 15 years. A pack-year is smoking an average of one pack of cigarettes per day for one year. Yearly screening should:  Continue until it has been 15 years since you quit.  Stop if you develop a health problem that would prevent you from  having lung cancer treatment. Colorectal Cancer  This type of cancer can be detected and can often be prevented.  Routine colorectal cancer screening usually begins at age 17 and continues through age 79.  If you have risk factors for colon cancer, your health care provider Byrd recommend that you be screened at an earlier age.  If you have a family history of colorectal cancer, talk with your health care provider about genetic screening.  Your health care provider Zaman also recommend using home test kits to check for hidden blood in your stool.  A small camera at the end of a tube can be used to examine your colon directly (sigmoidoscopy or colonoscopy). This is done to check for  the earliest forms of colorectal cancer.  Direct examination of the colon should be repeated every 5-10 years until age 63. However, if early forms of precancerous polyps or small growths are found or if you have a family history or genetic risk for colorectal cancer, you Klausner need to be screened more often. Skin Cancer  Check your skin from head to toe regularly.  Monitor any moles. Be sure to tell your health care provider:  About any new moles or changes in moles, especially if there is a change in a mole's shape or color.  If you have a mole that is larger than the size of a pencil eraser.  If any of your family members has a history of skin cancer, especially at a young age, talk with your health care provider about genetic screening.  Always use sunscreen. Apply sunscreen liberally and repeatedly throughout the day.  Whenever you are outside, protect yourself by wearing long sleeves, pants, a wide-brimmed hat, and sunglasses. WHAT SHOULD I KNOW ABOUT OSTEOPOROSIS? Osteoporosis is a condition in which bone destruction happens more quickly than new bone creation. After menopause, you Figgs be at an increased risk for osteoporosis. To help prevent osteoporosis or the bone fractures that can happen because of osteoporosis, the following is recommended:  If you are 25-51 years old, get at least 1,000 mg of calcium and at least 600 mg of vitamin D per day.  If you are older than age 4 but younger than age 81, get at least 1,200 mg of calcium and at least 600 mg of vitamin D per day.  If you are older than age 48, get at least 1,200 mg of calcium and at least 800 mg of vitamin D per day. Smoking and excessive alcohol intake increase the risk of osteoporosis. Eat foods that are rich in calcium and vitamin D, and do weight-bearing exercises several times each week as directed by your health care provider. WHAT SHOULD I KNOW ABOUT HOW MENOPAUSE AFFECTS [place]? Depression Durflinger occur at  any age, but it is more common as you become older. Common symptoms of depression include:  Low or sad mood.  Changes in sleep patterns.  Changes in appetite or eating patterns.  Feeling an overall lack of motivation or enjoyment of activities that you previously enjoyed.  Frequent crying spells. Talk with your health care provider if you think that you are experiencing depression. WHAT SHOULD I KNOW ABOUT IMMUNIZATIONS? It is important that you get and maintain your immunizations. These include:  Tetanus, diphtheria, and pertussis (Tdap) booster vaccine.  Influenza every year before the flu season begins.  Pneumonia vaccine.  Shingles vaccine. Your health care provider Gurry also recommend other immunizations.   This information is not intended to replace advice  given to you by your health care provider. Make sure you discuss any questions you have with your health care provider.   Document Released: 04/02/2005 Document Revised: 03/01/2014 Document Reviewed: 10/11/2013 Elsevier Interactive Patient Education [employer].

## 2015-08-22 ENCOUNTER — Ambulatory Visit: Payer: Self-pay | Admitting: Internal Medicine

## 2015-08-23 DIAGNOSIS — Z515 Encounter for palliative care: Secondary | ICD-10-CM | POA: Insufficient documentation

## 2015-08-23 NOTE — Assessment & Plan Note (Signed)

## 2015-08-23 NOTE — Assessment & Plan Note (Signed)
Managed with SSRI therapy. continue current dose of paxil

## 2015-08-23 NOTE — Assessment & Plan Note (Signed)
She does not appear to have lost any more executive function since last visit, and is well cared for at home by family.  There have been no recent episodes of agitation which appear to be managed with more familial supervision.

## 2015-08-24 ENCOUNTER — Encounter: Payer: Self-pay | Admitting: Internal Medicine

## 2015-09-04 ENCOUNTER — Other Ambulatory Visit: Payer: Self-pay | Admitting: *Deleted

## 2015-09-04 MED ORDER — EZETIMIBE 10 MG PO TABS: 10.0000 mg | ORAL_TABLET | Freq: Every day | ORAL | Status: DC

## 2015-09-10 NOTE — Telephone Encounter (Signed)
Mailed to patient. thanks 

## 2015-09-25 ENCOUNTER — Telehealth: Payer: Self-pay | Admitting: Internal Medicine

## 2015-09-25 NOTE — Telephone Encounter (Signed)
The patient's daughter called . She is needing an FL2 form filled out for her mother for a short stay facility due to her husband having bypass surgery and her brother having multiple surgeries done. She is needing help with taking care of her mother. Needing a name of a good short term facility to take care of her mother.

## 2015-09-25 NOTE — Telephone Encounter (Signed)
Please advise, I am not sure of recommendations, thanks

## 2015-09-26 NOTE — Telephone Encounter (Signed)
Left VM for daughter to return our call, thanks

## 2015-09-26 NOTE — Telephone Encounter (Signed)
thanks

## 2015-09-26 NOTE — Telephone Encounter (Signed)
[employer],  [place],  [place], [place].

## 2015-09-26 NOTE — Telephone Encounter (Signed)
Patients daughter was given the the suggested facilities

## 2015-09-29 DIAGNOSIS — Z23 Encounter for immunization: Secondary | ICD-10-CM | POA: Diagnosis not present

## 2015-09-30 ENCOUNTER — Ambulatory Visit (INDEPENDENT_AMBULATORY_CARE_PROVIDER_SITE_OTHER): Payer: Medicare Other | Admitting: Surgical

## 2015-09-30 DIAGNOSIS — Z111 Encounter for screening for respiratory tuberculosis: Secondary | ICD-10-CM

## 2015-09-30 NOTE — Progress Notes (Signed)
PPD placed left forearm. Patient tolerated well

## 2015-10-02 ENCOUNTER — Ambulatory Visit (INDEPENDENT_AMBULATORY_CARE_PROVIDER_SITE_OTHER): Payer: Medicare Other

## 2015-10-02 DIAGNOSIS — Z7689 Persons encountering health services in other specified circumstances: Secondary | ICD-10-CM | POA: Diagnosis not present

## 2015-10-02 DIAGNOSIS — Z111 Encounter for screening for respiratory tuberculosis: Secondary | ICD-10-CM

## 2015-10-02 LAB — TB SKIN TEST
INDURATION: 0 mm
TB SKIN TEST: NEGATIVE

## 2015-10-02 NOTE — Telephone Encounter (Signed)
Spoke with the daughter and she returned to pick up the form, thanks

## 2015-10-02 NOTE — Progress Notes (Signed)
Patient came in for PPD reading, results recorded and letter given to patient and daughter. thanks

## 2015-10-02 NOTE — Telephone Encounter (Signed)
FL2 placed in folder for review, thanks

## 2015-10-02 NOTE — Telephone Encounter (Signed)
FL-2 has been signed and returned to you,  No charge since I only had to sign it.

## 2015-10-13 ENCOUNTER — Telehealth: Payer: Self-pay | Admitting: Internal Medicine

## 2015-10-13 NOTE — Progress Notes (Signed)
  I have reviewed the above information and agree with above.   Clennon Nasca, MD 

## 2015-10-13 NOTE — Telephone Encounter (Signed)
Updated list for medication and Fl2 faxed to facility.

## 2015-10-13 NOTE — Telephone Encounter (Signed)
Vaughan Basta from [facility] called saying she needs an updated FL-2 for Cindy Harvey. Dr. Derrel Nip filled one out for August 10th but Ms. Ransier moved in yesterday. She's going to try to fax the information over so we can confirm her medication etc. Please give her a phone call regarding this if needed.  [facility] ph# [phone] Thank you.

## 2015-10-13 NOTE — Telephone Encounter (Signed)
Juliann Pulse this should be in your box sometime today

## 2015-10-27 ENCOUNTER — Other Ambulatory Visit: Payer: Self-pay | Admitting: Internal Medicine

## 2015-10-28 NOTE — Telephone Encounter (Signed)
Last filled on 05/23/15 #30 +5. Last OV 08/21/15. Okay to refill?

## 2015-11-04 ENCOUNTER — Ambulatory Visit (INDEPENDENT_AMBULATORY_CARE_PROVIDER_SITE_OTHER): Payer: Medicare Other

## 2015-11-04 DIAGNOSIS — Z111 Encounter for screening for respiratory tuberculosis: Secondary | ICD-10-CM | POA: Diagnosis not present

## 2015-11-04 NOTE — Progress Notes (Signed)
Pt was in today receiving a TB skin test placement. Pt is in need of a secondary test via nursing home standards. Pt tolerated well.

## 2015-11-06 ENCOUNTER — Encounter: Payer: Self-pay | Admitting: *Deleted

## 2015-11-06 ENCOUNTER — Ambulatory Visit (INDEPENDENT_AMBULATORY_CARE_PROVIDER_SITE_OTHER): Payer: Medicare Other | Admitting: *Deleted

## 2015-11-06 DIAGNOSIS — Z111 Encounter for screening for respiratory tuberculosis: Secondary | ICD-10-CM | POA: Diagnosis not present

## 2015-11-06 LAB — TB SKIN TEST
INDURATION: 0 mm
TB Skin Test: NEGATIVE

## 2015-11-06 NOTE — Progress Notes (Addendum)
TB test negative   Small amount of bruising on forearm.  TB skin test negative.  Agree.   Dr Nicki Reaper

## 2015-11-13 ENCOUNTER — Other Ambulatory Visit: Payer: Self-pay | Admitting: Internal Medicine

## 2015-11-13 NOTE — Telephone Encounter (Signed)
Ok to refill mobic.

## 2015-12-05 DIAGNOSIS — Z23 Encounter for immunization: Secondary | ICD-10-CM | POA: Diagnosis not present

## 2015-12-09 ENCOUNTER — Other Ambulatory Visit: Payer: Self-pay | Admitting: Internal Medicine

## 2015-12-12 ENCOUNTER — Encounter: Payer: Self-pay | Admitting: *Deleted

## 2015-12-12 ENCOUNTER — Telehealth: Payer: Self-pay | Admitting: *Deleted

## 2015-12-12 NOTE — Telephone Encounter (Signed)
This is been happening X 1 week. Patient sleeping through the night and sleeping most of the day. Even when she goes to dinning room she falls a sleep at dinner , breakfast and lurch , patient is very hard to arouse.  Med tech could not give vitals on ly checked with order.

## 2015-12-12 NOTE — Telephone Encounter (Signed)
Facilty notified and order faxed to reduce paxil.

## 2015-12-12 NOTE — Telephone Encounter (Signed)
Cindy Harvey wanted to report that patient is having extreme fatigue.Its reported that she sleeps during all meal, causing her to not eat, she also seem very drowsey when awake. Facility called to pharmacy to see if any of her medications where causing the extreme tiredness in pt.,however the pharmacist suggested that none of her medications should be cusing this. Please contact Cindy Harvey from [facility] with advised care.  Contact [phone]

## 2015-12-12 NOTE — Telephone Encounter (Signed)
I'm not very alarmed since She's 89!   Try reducing the paxil to 10 mg daily

## 2016-02-20 ENCOUNTER — Encounter: Payer: Self-pay | Admitting: Internal Medicine

## 2016-02-20 ENCOUNTER — Ambulatory Visit (INDEPENDENT_AMBULATORY_CARE_PROVIDER_SITE_OTHER): Payer: Medicare Other | Admitting: Internal Medicine

## 2016-02-20 DIAGNOSIS — F0392 Unspecified dementia, unspecified severity, with psychotic disturbance: Secondary | ICD-10-CM

## 2016-02-20 DIAGNOSIS — F0281 Dementia in other diseases classified elsewhere with behavioral disturbance: Secondary | ICD-10-CM

## 2016-02-20 DIAGNOSIS — F0391 Unspecified dementia with behavioral disturbance: Secondary | ICD-10-CM

## 2016-02-20 DIAGNOSIS — I10 Essential (primary) hypertension: Secondary | ICD-10-CM

## 2016-02-20 MED ORDER — QUETIAPINE FUMARATE 25 MG PO TABS: 12.5000 mg | ORAL_TABLET | Freq: Every day | ORAL | 0 refills | Status: DC

## 2016-02-20 NOTE — Progress Notes (Signed)
Subjective:  Patient ID: Cindy Harvey, female    DOB: [birth year]  Age: 82 y.o. MRN: [id number]  CC: Diagnoses of Essential hypertension and Senile dementia with psychosis were pertinent to this visit.  HPI Cindy Harvey presents for 6 month follow up on hypertension and dementia with GAD managed with paxil.  Since her last visit her family has relocated her to assisted living at [facility]. She has not adjusted well  Per daughter .  She has not started to socialize yet with the other residents, and has been barricading herself in her private room.  She is not sleeping at night and sleeping during the day. The patient is calm and denies that she has been engaging in this behavior.    She Has gained 5 lbs since her last visit.   Outpatient Medications Prior to Visit  Medication Sig Dispense Refill  . acetaminophen (TYLENOL) 160 MG/5ML liquid Take 325 mg by mouth daily as needed for fever (knee pain or headache).    Marland Kitchen amLODipine (NORVASC) 5 MG tablet TAKE 1 TABLET (5 MG TOTAL) BY MOUTH DAILY. 30 tablet 11  . Calcium Carbonate-Vitamin D (CALCIUM 600+D) 600-200 MG-UNIT TABS Take 1 tablet by mouth daily.     Marland Kitchen ezetimibe (ZETIA) 10 MG tablet Take 1 tablet (10 mg total) by mouth daily. 30 tablet 11  . GuaiFENesin (MUCINEX CHILDRENS PO) Take 1 Dose by mouth daily as needed (cough).     Marland Kitchen lisinopril (PRINIVIL,ZESTRIL) 20 MG tablet Take 10 mg by mouth daily.    . meloxicam (MOBIC) 7.5 MG tablet TAKE 1 TABLET (7.5 MG TOTAL) BY MOUTH DAILY. 30 tablet 5  . meloxicam (MOBIC) 7.5 MG tablet TAKE 1 TABLET (7.5 MG TOTAL) BY MOUTH DAILY. 30 tablet 0  . Multiple Vitamins-Minerals (OCUVITE PO) Take 1 tablet by mouth daily.     . multivitamin (THERAGRAN) per tablet Take 1 tablet by mouth daily.      . niacin (NIASPAN) 500 MG CR tablet Take 2 tablets (1,000 mg total) by mouth at bedtime. 60 tablet 12  . Nutritional Supplements (ENSURE PO) Take by mouth at bedtime.     . Omega-3 Fatty Acids (FISH OIL) 1200 MG CAPS  Take 1 tablet by mouth daily.    Marland Kitchen PARoxetine (PAXIL) 20 MG tablet TAKE 1 TABLET (20 MG TOTAL) BY MOUTH DAILY. 90 tablet 1  . PARoxetine (PAXIL) 20 MG tablet TAKE 1 TABLET (20 MG TOTAL) BY MOUTH DAILY. 90 tablet 1  . promethazine (PHENERGAN) 25 MG tablet Take 25 mg by mouth as needed. For nausea      . fexofenadine (ALLEGRA) 60 MG tablet Take 60 mg by mouth daily.     No facility-administered medications prior to visit.     Review of Systems;  Patient denies headache, fevers, malaise, unintentional weight loss, skin rash, eye pain, sinus congestion and sinus pain, sore throat, dysphagia,  hemoptysis , cough, dyspnea, wheezing, chest pain, palpitations, orthopnea, edema, abdominal pain, nausea, melena, diarrhea, constipation, flank pain, dysuria, hematuria, urinary  Frequency, nocturia, numbness, tingling, seizures,  Focal weakness, Loss of consciousness,  Tremor, insomnia, depression, anxiety, and suicidal ideation.      Objective:  BP (!) 142/60   Pulse 83   Temp 97.8 F (36.6 C) (Oral)   Ht 5' 1.25" (1.556 m)   Wt 124 lb 3.2 oz (56.3 kg)   SpO2 95%   BMI 23.28 kg/m   BP Readings from Last 3 Encounters:  02/20/16 (!) 142/60  08/21/15  138/60  07/30/15 (!) 158/52    Wt Readings from Last 3 Encounters:  02/20/16 124 lb 3.2 oz (56.3 kg)  08/21/15 119 lb 12 oz (54.3 kg)  07/30/15 116 lb 12.8 oz (53 kg)    General appearance: alert, well groomed, cooperative and appears stated age Back: symmetric, no curvature. ROM normal. No CVA tenderness. Lungs: clear to auscultation bilaterally Heart: regular rate and rhythm, S1, S2 normal, no murmur, click, rub or gallop Abdomen: soft, non-tender; bowel sounds normal; no masses,  no organomegaly Pulses: 2+ and symmetric Skin: Skin color, texture, turgor normal. No rashes or lesions Lymph nodes: Cervical, supraclavicular, and axillary nodes normal. Psych: affect normal, makes good eye contact. No fidgeting,  Smiles easily.  Denies  suicidal thoughts   No results found for: HGBA1C  Lab Results  Component Value Date   CREATININE 2.48 (H) 08/21/2015   CREATININE 2.23 (H) 02/07/2015   CREATININE 2.29 (H) 08/21/2014    Lab Results  Component Value Date   WBC 9.8 08/21/2014   HGB 11.2 (L) 08/21/2014   HCT 33.3 (L) 08/21/2014   PLT 348.0 08/21/2014   GLUCOSE 82 08/21/2015   CHOL 206 (H) 02/07/2015   TRIG 90 02/07/2015   HDL 58 02/07/2015   LDLDIRECT 164.7 01/31/2013   LDLCALC 130 (H) 02/07/2015   ALT 16 08/21/2015   AST 23 08/21/2015   NA 138 08/21/2015   K 4.5 08/21/2015   CL 102 08/21/2015   CREATININE 2.48 (H) 08/21/2015   BUN 44 (H) 08/21/2015   CO2 28 08/21/2015   TSH 0.52 09/26/2013     Assessment & Plan:   Problem List Items Addressed This Visit    Essential hypertension    Well controlled for age on current regimen. Renal function stable, no changes today.      Senile dementia with psychosis    Since her transition to assisted living,  Her paranoid behavior and agitation have worsened.  discussed resuming 12.5 mg seroquel at night.        Relevant Medications   QUEtiapine (SEROQUEL) 25 MG tablet    A total of 25 minutes of face to face time was spent with patient more than half of which was spent in counselling about the above mentioned conditions  and coordination of care   I am having Ms. Ault start on QUEtiapine. I am also having her maintain her Calcium Carbonate-Vitamin D, multivitamin, Nutritional Supplements (ENSURE PO), promethazine, Multiple Vitamins-Minerals (OCUVITE PO), niacin, fexofenadine, GuaiFENesin (MUCINEX CHILDRENS PO), acetaminophen, amLODipine, lisinopril, Fish Oil, PARoxetine, PARoxetine, ezetimibe, meloxicam, and meloxicam.  Meds ordered this encounter  Medications  . QUEtiapine (SEROQUEL) 25 MG tablet    Sig: Take 0.5 tablets (12.5 mg total) by mouth at bedtime.    Dispense:  45 tablet    Refill:  0    There are no discontinued medications.  Follow-up:  Return in about 6 months (around 08/20/2016).   Crecencio Mc, MD

## 2016-02-20 NOTE — Progress Notes (Signed)
Pre visit review using our clinic review tool, if applicable. No additional management support is needed unless otherwise documented below in the visit note. 

## 2016-02-20 NOTE — Patient Instructions (Addendum)
I AM GOING TO RETRY A SMALL DOSE OF SEROQUEL TO HELP HER REST AT NIGHT   Continue the paxil

## 2016-02-22 NOTE — Assessment & Plan Note (Signed)
Since her transition to assisted living,  Her paranoid behavior and agitation have worsened.  discussed resuming 12.5 mg seroquel at night.

## 2016-02-22 NOTE — Assessment & Plan Note (Signed)
Well controlled for age on current regimen. Renal function stable, no changes today. 

## 2016-02-23 ENCOUNTER — Emergency Department: Payer: Medicare Other

## 2016-02-23 ENCOUNTER — Inpatient Hospital Stay
Admission: EM | Admit: 2016-02-23 | Discharge: 2016-02-26 | DRG: 064 | Disposition: A | Discharge status: Skilled Nursing Facility | Payer: Medicare Other | Attending: Internal Medicine | Admitting: Internal Medicine

## 2016-02-23 DIAGNOSIS — Z79899 Other long term (current) drug therapy: Secondary | ICD-10-CM

## 2016-02-23 DIAGNOSIS — Z7902 Long term (current) use of antithrombotics/antiplatelets: Secondary | ICD-10-CM

## 2016-02-23 DIAGNOSIS — I129 Hypertensive chronic kidney disease with stage 1 through stage 4 chronic kidney disease, or unspecified chronic kidney disease: Secondary | ICD-10-CM | POA: Diagnosis not present

## 2016-02-23 DIAGNOSIS — N189 Chronic kidney disease, unspecified: Secondary | ICD-10-CM | POA: Diagnosis present

## 2016-02-23 DIAGNOSIS — J111 Influenza due to unidentified influenza virus with other respiratory manifestations: Secondary | ICD-10-CM | POA: Diagnosis present

## 2016-02-23 DIAGNOSIS — E785 Hyperlipidemia, unspecified: Secondary | ICD-10-CM | POA: Diagnosis present

## 2016-02-23 DIAGNOSIS — R262 Difficulty in walking, not elsewhere classified: Secondary | ICD-10-CM

## 2016-02-23 DIAGNOSIS — Z888 Allergy status to other drugs, medicaments and biological substances status: Secondary | ICD-10-CM

## 2016-02-23 DIAGNOSIS — Z791 Long term (current) use of non-steroidal anti-inflammatories (NSAID): Secondary | ICD-10-CM

## 2016-02-23 DIAGNOSIS — I10 Essential (primary) hypertension: Secondary | ICD-10-CM | POA: Diagnosis present

## 2016-02-23 DIAGNOSIS — G9340 Encephalopathy, unspecified: Secondary | ICD-10-CM | POA: Diagnosis not present

## 2016-02-23 DIAGNOSIS — M6281 Muscle weakness (generalized): Secondary | ICD-10-CM

## 2016-02-23 DIAGNOSIS — I6340 Cerebral infarction due to embolism of unspecified cerebral artery: Principal | ICD-10-CM | POA: Diagnosis present

## 2016-02-23 DIAGNOSIS — F411 Generalized anxiety disorder: Secondary | ICD-10-CM | POA: Diagnosis present

## 2016-02-23 DIAGNOSIS — N179 Acute kidney failure, unspecified: Secondary | ICD-10-CM | POA: Diagnosis present

## 2016-02-23 DIAGNOSIS — F0390 Unspecified dementia without behavioral disturbance: Secondary | ICD-10-CM | POA: Diagnosis not present

## 2016-02-23 DIAGNOSIS — R4182 Altered mental status, unspecified: Secondary | ICD-10-CM | POA: Diagnosis not present

## 2016-02-23 DIAGNOSIS — G4700 Insomnia, unspecified: Secondary | ICD-10-CM | POA: Diagnosis present

## 2016-02-23 DIAGNOSIS — Z886 Allergy status to analgesic agent status: Secondary | ICD-10-CM

## 2016-02-23 DIAGNOSIS — R531 Weakness: Secondary | ICD-10-CM | POA: Diagnosis not present

## 2016-02-23 DIAGNOSIS — R400 Somnolence: Secondary | ICD-10-CM | POA: Diagnosis not present

## 2016-02-23 DIAGNOSIS — Z66 Do not resuscitate: Secondary | ICD-10-CM | POA: Diagnosis present

## 2016-02-23 DIAGNOSIS — I639 Cerebral infarction, unspecified: Secondary | ICD-10-CM | POA: Diagnosis present

## 2016-02-23 DIAGNOSIS — J101 Influenza due to other identified influenza virus with other respiratory manifestations: Secondary | ICD-10-CM | POA: Diagnosis present

## 2016-02-23 LAB — COMPREHENSIVE METABOLIC PANEL
ALK PHOS: 76 U/L (ref 38–126)
ALT: 14 U/L (ref 14–54)
ANION GAP: 8 (ref 5–15)
AST: 22 U/L (ref 15–41)
Albumin: 3.7 g/dL (ref 3.5–5.0)
BILIRUBIN TOTAL: 0.5 mg/dL (ref 0.3–1.2)
BUN: 46 mg/dL — ABNORMAL HIGH (ref 6–20)
CALCIUM: 8.5 mg/dL — AB (ref 8.9–10.3)
CO2: 23 mmol/L (ref 22–32)
Chloride: 102 mmol/L (ref 101–111)
Creatinine, Ser: 3.01 mg/dL — ABNORMAL HIGH (ref 0.44–1.00)
GFR calc non Af Amer: 12 mL/min — ABNORMAL LOW (ref >60)
GFR, EST AFRICAN AMERICAN: 14 mL/min — AB (ref >60)
Glucose, Bld: 104 mg/dL — ABNORMAL HIGH (ref 65–99)
POTASSIUM: 4.4 mmol/L (ref 3.5–5.1)
SODIUM: 133 mmol/L — AB (ref 135–145)
TOTAL PROTEIN: 6.6 g/dL (ref 6.5–8.1)

## 2016-02-23 LAB — CBC
HCT: 29.8 % — ABNORMAL LOW (ref 35.0–47.0)
Hemoglobin: 10.3 g/dL — ABNORMAL LOW (ref 12.0–16.0)
MCH: 29.7 pg (ref 26.0–34.0)
MCHC: 34.6 g/dL (ref 32.0–36.0)
MCV: 85.8 fL (ref 80.0–100.0)
Platelets: 294 10*3/uL (ref 150–440)
RBC: 3.48 MIL/uL — AB (ref 3.80–5.20)
RDW: 14.6 % — ABNORMAL HIGH (ref 11.5–14.5)
WBC: 9.9 10*3/uL (ref 3.6–11.0)

## 2016-02-23 LAB — URINALYSIS, COMPLETE (UACMP) WITH MICROSCOPIC
BILIRUBIN URINE: NEGATIVE
Bacteria, UA: NONE SEEN
GLUCOSE, UA: NEGATIVE mg/dL
HGB URINE DIPSTICK: NEGATIVE
Ketones, ur: NEGATIVE mg/dL
LEUKOCYTES UA: NEGATIVE
Nitrite: NEGATIVE
PH: 5 (ref 5.0–8.0)
Protein, ur: >=300 mg/dL — AB
SPECIFIC GRAVITY, URINE: 1.014 (ref 1.005–1.030)
Squamous Epithelial / LPF: NONE SEEN

## 2016-02-23 LAB — GLUCOSE, CAPILLARY: GLUCOSE-CAPILLARY: 101 mg/dL — AB (ref 65–99)

## 2016-02-23 MED ORDER — SODIUM CHLORIDE 0.9 % IV BOLUS (SEPSIS)
500.0000 mL | Freq: Once | INTRAVENOUS | Status: AC
  Administered 2016-02-23: 500 mL via INTRAVENOUS

## 2016-02-23 NOTE — ED Notes (Addendum)
Pt denies urinary sxs and general illness. Poor appetite, sleeping during meals.

## 2016-02-23 NOTE — ED Triage Notes (Signed)
Pt arrives to ER via POV from [facility] for AMS. Pt has been sleeping a lot. Family reports that she fell asleep both at lunch and dinner today which is abnormal. PT alert and oriented X 4

## 2016-02-23 NOTE — ED Provider Notes (Addendum)
[facility] Emergency Department Provider Note  ____________________________________________   I have reviewed the triage vital signs and the nursing notes.   HISTORY  Chief Complaint Altered Mental Status    HPI Cindy Harvey is a 83 y.o. female who presents today complaining of being fatigued. According to family, especially her daughter who is a Marine scientist, she has been somnolent all day and missing meals. No other focal pathology as been detected. No other complaints. Patient herself feels "okay". There is no recent urinary tract infection no dysuria no urinary frequency no headache, he does have a slight cough. When she is awoken, she is seems to be at her baseline but she's been much more somnolent than usual. She Peckman have been less responsive earlier today but refused transport was able to ambulate away when EMS arrived.   Level 5 chart caveat; no further history available due to patient status.   Past Medical History:  Diagnosis Date  . Allergy    allergic rhinitis  . Hearing loss   . History of shingles   . Hypercholesterolemia   . Hypertension   . Macular degeneration   . Osteoarthritis    right knee  . Pneumonia 2009   with dehydration and electrolyte imbalance  . Urinary incontinence     Patient Active Problem List   Diagnosis Date Noted  . Acute encephalopathy 02/23/2016  . Acute on chronic renal failure ([place]) 02/23/2016  . Medicare annual wellness visit, subsequent 08/23/2015  . 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 Generalized anxiety disorder 12/04/2013  . History of fall 09/28/2013  . DNR (do not resuscitate) discussion 09/26/2013  . Hyponatremia 09/26/2013  . Chronic renal insufficiency, stage III (moderate) 01/31/2013  . Heart murmur 07/21/2011  . Post-menopausal 07/14/2010  . Osteoporosis screening 07/14/2010  . Hordeolum 07/07/2010  . Depression (emotion) 05/21/2010  . HLD (hyperlipidemia) 10/24/2008  . Essential  hypertension 10/24/2008  . ALLERGIC RHINITIS 10/24/2008  . Osteoarthrosis involving lower leg 10/24/2008    Past Surgical History:  Procedure Laterality Date  . TONSILLECTOMY  1928    Prior to Admission medications   Medication Sig Start Date End Date Taking? Authorizing Provider  acetaminophen (TYLENOL) 160 MG/5ML liquid Take 325 mg by mouth daily as needed for fever (knee pain or headache).   Yes Historical Provider, MD  amLODipine (NORVASC) 5 MG tablet TAKE 1 TABLET (5 MG TOTAL) BY MOUTH DAILY. 01/27/15  Yes Darlin Coco, MD  ezetimibe (ZETIA) 10 MG tablet Take 1 tablet (10 mg total) by mouth daily. 09/04/15  Yes Thayer Headings, MD  meloxicam (MOBIC) 7.5 MG tablet TAKE 1 TABLET (7.5 MG TOTAL) BY MOUTH DAILY. 11/14/15  Yes Crecencio Mc, MD  Multiple Vitamins-Minerals (OCUVITE PO) Take 1 tablet by mouth daily.    Yes Historical Provider, MD  niacin (NIASPAN) 500 MG CR tablet Take 2 tablets (1,000 mg total) by mouth at bedtime. 02/12/11  Yes Darlin Coco, MD  PARoxetine (PAXIL) 10 MG tablet Take 10 mg by mouth daily.   Yes Historical Provider, MD  QUEtiapine (SEROQUEL) 25 MG tablet Take 0.5 tablets (12.5 mg total) by mouth at bedtime. 02/20/16  Yes Crecencio Mc, MD  Calcium Carbonate-Vitamin D (CALCIUM 600+D) 600-200 MG-UNIT TABS Take 1 tablet by mouth daily.     Historical Provider, MD  fexofenadine (ALLEGRA) 60 MG tablet Take 60 mg by mouth daily. 08/04/10 07/30/15  Darlin Coco, MD  GuaiFENesin (MUCINEX CHILDRENS PO) Take 1 Dose by mouth daily as needed (  cough).     Historical Provider, MD  lisinopril (PRINIVIL,ZESTRIL) 20 MG tablet Take 10 mg by mouth daily. 01/09/13   Historical Provider, MD  multivitamin [employer]) per tablet Take 1 tablet by mouth daily.      Historical Provider, MD  Nutritional Supplements (ENSURE PO) Take by mouth at bedtime.     Historical Provider, MD  Omega-3 Fatty Acids (FISH OIL) 1200 MG CAPS Take 1 tablet by mouth daily.    Historical Provider, MD   promethazine (PHENERGAN) 25 MG tablet Take 25 mg by mouth as needed. For nausea      Historical Provider, MD    Allergies Asa buff (mag [buffered aspirin]; Lipitor [atorvastatin calcium]; Seroquel [quetiapine fumarate]; and Zocor [simvastatin - high dose]  Family History  Problem Relation Age of Onset  . Heart disease Mother   . Stroke Mother   . Stroke Sister   . Cancer Maternal Aunt     breast CA  . Cancer Cousin     breast cancer    Social History Social History  Substance Use Topics  . Smoking status: Never Smoker  . Smokeless tobacco: Not on file  . Alcohol use No    Review of Systems Constitutional: No fever/chills Eyes: No visual changes. ENT: No sore throat. No stiff neck no neck pain Cardiovascular: Denies chest pain. Respiratory: Denies shortness of breath. Gastrointestinal:   no vomiting.  No diarrhea.  No constipation. Genitourinary: Negative for dysuria. Musculoskeletal: Negative lower extremity swelling Skin: Negative for rash. Neurological: Negative for severe headaches, focal weakness or numbness. 10-point ROS otherwise negative.  ____________________________________________   PHYSICAL EXAM:  VITAL SIGNS: ED Triage Vitals  Enc Vitals Group     BP 02/23/16 1818 (!) 116/50     Pulse Rate 02/23/16 1818 81     Resp 02/23/16 1818 18     Temp 02/23/16 1818 98 F (36.7 C)     Temp Source 02/23/16 1818 Oral     SpO2 02/23/16 1818 100 %     Weight 02/23/16 1819 125 lb (56.7 kg)     Height 02/23/16 1819 5\' 1"  (1.549 m)     Head Circumference --      Peak Flow --      Pain Score --      Pain Loc --      Pain Edu? --      Excl. in [place]? --     Constitutional: She is somnolent but when I talked her she wakes up and speaks appropriately she has good short-term and long-term memory. Eyes: Conjunctivae are normal. PERRL. EOMI. Head: Atraumatic. Nose: No congestion/rhinnorhea. Mouth/Throat: Mucous membranes are moist.  Oropharynx  non-erythematous. Neck: No stridor.   Nontender with no meningismus Cardiovascular: Normal rate, regular rhythm. Grossly normal heart sounds.  Good peripheral circulation. Respiratory: Normal respiratory effort.  No retractions. Lungs CTAB. Abdominal: Soft and nontender. No distention. No guarding no rebound Back:  There is no focal tenderness or step off.  there is no midline tenderness there are no lesions noted. there is no CVA tenderness Musculoskeletal: No lower extremity tenderness, no upper extremity tenderness. No joint effusions, no DVT signs strong distal pulses no edema Neurologic:  Normal speech and language. No gross focal neurologic deficits are appreciated.  Skin:  Skin is warm, dry and intact. No rash noted. Psychiatric: Mood and affect are normal. Speech and behavior are normal.  ____________________________________________   LABS (all labs ordered are listed, but only abnormal results are displayed)  Labs  Reviewed  COMPREHENSIVE METABOLIC PANEL - Abnormal; Notable for the following:       Result Value   Sodium 133 (*)    Glucose, Bld 104 (*)    BUN 46 (*)    Creatinine, Ser 3.01 (*)    Calcium 8.5 (*)    GFR calc non Af Amer 12 (*)    GFR calc Af Amer 14 (*)    All other components within normal limits  CBC - Abnormal; Notable for the following:    RBC 3.48 (*)    Hemoglobin 10.3 (*)    HCT 29.8 (*)    RDW 14.6 (*)    All other components within normal limits  GLUCOSE, CAPILLARY - Abnormal; Notable for the following:    Glucose-Capillary 101 (*)    All other components within normal limits  URINALYSIS, COMPLETE (UACMP) WITH MICROSCOPIC - Abnormal; Notable for the following:    Color, Urine YELLOW (*)    APPearance CLEAR (*)    Protein, ur >=300 (*)    All other components within normal limits  CBG MONITORING, ED   ____________________________________________  EKG  I personally interpreted any EKGs ordered by me or triage No minimal sinus rhythm rate  82 bpm no acute ST elevation or depression nonspecific ST changes, normal axis ____________________________________________  RADIOLOGY  I reviewed any imaging ordered by me or triage that were performed during my shift and, if possible, patient and/or family made aware of any abnormal findings. ____________________________________________   PROCEDURES  Procedure(s) performed: None  Procedures  Critical Care performed: None  ____________________________________________   INITIAL IMPRESSION / ASSESSMENT AND PLAN / ED COURSE  Pertinent labs & imaging results that were available during my care of the patient were reviewed by me and considered in my medical decision making (see chart for details).  Patient remains somnolent but nontoxic, family would feel more With an observational stay given that she Worst have been minimally responsive at one point during the day, and has been missing meals and is off her baseline. Unclear what cause for her altered mental status. No obvious indication that she has a stroke. Patient will require further workup as an inpatient. She is somewhat dehydrated we are addressing that. Her creatinine is up from baseline and certainly could be either a cause or a symptom of her underlying pathology. No obvious infectious etiology given and no obvious focality to her exam. Hospitalist agrees with admission.  ----------------------------------------- 11:09 PM on 02/23/2016 -----------------------------------------  Patient very much does not wish to come into the hospital. Hospitalist, Dr. Jannifer Franklin, has evaluated her. He feels that if an MRI is negative she likely could go home. Family is okay with this. We will order an MRI. Signed out to Dr. Karma Greaser at the end of my  Shift.  Clinical Course    ____________________________________________   FINAL CLINICAL IMPRESSION(S) / ED DIAGNOSES  Final diagnoses:  Altered mental status, unspecified altered mental status  type      This chart was dictated using voice recognition software.  Despite best efforts to proofread,  errors can occur which can change meaning.      Schuyler Amor, MD 02/23/16 [place], MD 02/23/16 [phone]

## 2016-02-24 ENCOUNTER — Observation Stay: Payer: Medicare Other

## 2016-02-24 DIAGNOSIS — E785 Hyperlipidemia, unspecified: Secondary | ICD-10-CM | POA: Diagnosis not present

## 2016-02-24 DIAGNOSIS — F419 Anxiety disorder, unspecified: Secondary | ICD-10-CM | POA: Diagnosis not present

## 2016-02-24 DIAGNOSIS — I10 Essential (primary) hypertension: Secondary | ICD-10-CM | POA: Diagnosis not present

## 2016-02-24 DIAGNOSIS — N179 Acute kidney failure, unspecified: Secondary | ICD-10-CM | POA: Diagnosis not present

## 2016-02-24 DIAGNOSIS — R410 Disorientation, unspecified: Secondary | ICD-10-CM | POA: Diagnosis not present

## 2016-02-24 DIAGNOSIS — G9340 Encephalopathy, unspecified: Secondary | ICD-10-CM | POA: Diagnosis not present

## 2016-02-24 LAB — BASIC METABOLIC PANEL
Anion gap: 7 (ref 5–15)
BUN: 41 mg/dL — AB (ref 6–20)
CO2: 22 mmol/L (ref 22–32)
Calcium: 9.1 mg/dL (ref 8.9–10.3)
Chloride: 105 mmol/L (ref 101–111)
Creatinine, Ser: 2.63 mg/dL — ABNORMAL HIGH (ref 0.44–1.00)
GFR calc Af Amer: 16 mL/min — ABNORMAL LOW (ref >60)
GFR, EST NON AFRICAN AMERICAN: 14 mL/min — AB (ref >60)
GLUCOSE: 88 mg/dL (ref 65–99)
POTASSIUM: 4.1 mmol/L (ref 3.5–5.1)
SODIUM: 134 mmol/L — AB (ref 135–145)

## 2016-02-24 LAB — CBC
HEMATOCRIT: 31 % — AB (ref 35.0–47.0)
Hemoglobin: 10.6 g/dL — ABNORMAL LOW (ref 12.0–16.0)
MCH: 29.3 pg (ref 26.0–34.0)
MCHC: 34 g/dL (ref 32.0–36.0)
MCV: 86.1 fL (ref 80.0–100.0)
Platelets: 298 10*3/uL (ref 150–440)
RBC: 3.6 MIL/uL — ABNORMAL LOW (ref 3.80–5.20)
RDW: 14.8 % — AB (ref 11.5–14.5)
WBC: 10.2 10*3/uL (ref 3.6–11.0)

## 2016-02-24 LAB — MRSA PCR SCREENING: MRSA by PCR: NEGATIVE

## 2016-02-24 MED ORDER — PAROXETINE HCL 10 MG PO TABS
10.0000 mg | ORAL_TABLET | Freq: Every day | ORAL | Status: DC
  Administered 2016-02-25 – 2016-02-26 (×2): 10 mg via ORAL
  Filled 2016-02-24 (×2): qty 1

## 2016-02-24 MED ORDER — METOPROLOL TARTRATE 25 MG PO TABS
25.0000 mg | ORAL_TABLET | Freq: Two times a day (BID) | ORAL | Status: DC
  Administered 2016-02-24 – 2016-02-25 (×3): 25 mg via ORAL
  Filled 2016-02-24 (×3): qty 1

## 2016-02-24 MED ORDER — HEPARIN SODIUM (PORCINE) 5000 UNIT/ML IJ SOLN
5000.0000 [IU] | Freq: Three times a day (TID) | INTRAMUSCULAR | Status: DC
  Administered 2016-02-24 – 2016-02-26 (×8): 5000 [IU] via SUBCUTANEOUS
  Filled 2016-02-24 (×8): qty 1

## 2016-02-24 MED ORDER — SODIUM CHLORIDE 0.9 % IV SOLN
INTRAVENOUS | Status: AC
  Administered 2016-02-24: 02:00:00 via INTRAVENOUS

## 2016-02-24 MED ORDER — ACETAMINOPHEN 325 MG PO TABS
650.0000 mg | ORAL_TABLET | Freq: Four times a day (QID) | ORAL | Status: DC | PRN
  Administered 2016-02-24 – 2016-02-25 (×2): 650 mg via ORAL
  Filled 2016-02-24 (×2): qty 2

## 2016-02-24 MED ORDER — AMLODIPINE BESYLATE 5 MG PO TABS
5.0000 mg | ORAL_TABLET | Freq: Every day | ORAL | Status: DC
  Administered 2016-02-24 – 2016-02-26 (×3): 5 mg via ORAL
  Filled 2016-02-24 (×3): qty 1

## 2016-02-24 MED ORDER — SODIUM CHLORIDE 0.9 % IV SOLN
INTRAVENOUS | Status: AC
  Administered 2016-02-24: 11:00:00 via INTRAVENOUS

## 2016-02-24 MED ORDER — LISINOPRIL 10 MG PO TABS
10.0000 mg | ORAL_TABLET | Freq: Every day | ORAL | Status: DC
  Administered 2016-02-25: 09:00:00 10 mg via ORAL
  Filled 2016-02-24: qty 1

## 2016-02-24 MED ORDER — LORAZEPAM 2 MG/ML IJ SOLN
0.5000 mg | Freq: Once | INTRAMUSCULAR | Status: AC
  Administered 2016-02-24: 10:00:00 0.5 mg via INTRAVENOUS
  Filled 2016-02-24: qty 1

## 2016-02-24 MED ORDER — ACETAMINOPHEN 325 MG PO TABS: 650.0000 mg | ORAL_TABLET | Freq: Four times a day (QID) | ORAL | Status: DC | PRN

## 2016-02-24 NOTE — Consult Note (Deleted)
[facility] [place] NAME: Cindy Harvey    MR#:  [id number]  DATE OF BIRTH:  03-06-1916  DATE OF ADMISSION:  02/23/2016  PRIMARY CARE PHYSICIAN: Crecencio Mc, MD   REQUESTING/REFERRING PHYSICIAN: Burlene Arnt, MD  CHIEF COMPLAINT:   Chief Complaint  Patient presents with  . Altered Mental Status    HISTORY OF PRESENT ILLNESS:  Cindy Harvey  is a 83 y.o. female who presents with Persistent confusion throughout the day today. Patient's family states the patient was very lethargic, she slept all day missing several meals which is very characteristic for her. Initially hospitalists were called with some concern for possible stroke as the rest of the patient's workup in the ED was largely negative. On evaluation the patient herself is alert and oriented, with no focal deficits on exam. She states that she does not wish to be admitted to the hospital.  Patient has significant family history for stroke and the discussion was had with her about the importance of ruling out stroke. She is oriented, but does get confused during parts of the conversation. Patient states she was not sleeping well recently and that is why she slept throughout the day today, but family states that she was intermittently confused and very lethargic.  PAST MEDICAL HISTORY:   Past Medical History:  Diagnosis Date  . Allergy    allergic rhinitis  . Hearing loss   . History of shingles   . Hypercholesterolemia   . Hypertension   . Macular degeneration   . Osteoarthritis    right knee  . Pneumonia 2009   with dehydration and electrolyte imbalance  . Urinary incontinence     PAST SURGICAL HISTOIRY:   Past Surgical History:  Procedure Laterality Date  . TONSILLECTOMY  1928    SOCIAL HISTORY:   Social History  Substance Use Topics  . Smoking status: Never Smoker  . Smokeless tobacco: Not on file  . Alcohol use No    FAMILY HISTORY:   Family History   Problem Relation Age of Onset  . Heart disease Mother   . Stroke Mother   . Stroke Sister   . Cancer Maternal Aunt     breast CA  . Cancer Cousin     breast cancer    DRUG ALLERGIES:   Allergies  Allergen Reactions  . Asa Buff (Mag [Buffered Aspirin] Other (See Comments)    Nose bleeds  . Lipitor [Atorvastatin Calcium] Other (See Comments)    Headaches & dizzy  . Seroquel [Quetiapine Fumarate] Other (See Comments)    Lethargy  . Zocor [Simvastatin - High Dose] Other (See Comments)    myalgia    REVIEW OF SYSTEMS:  Review of Systems  Constitutional: Negative for chills, fever, malaise/fatigue and weight loss.  HENT: Negative for ear pain, hearing loss and tinnitus.   Eyes: Negative for blurred vision, double vision, pain and redness.  Respiratory: Negative for cough, hemoptysis and shortness of breath.   Cardiovascular: Negative for chest pain, palpitations, orthopnea and leg swelling.  Gastrointestinal: Negative for abdominal pain, constipation, diarrhea, nausea and vomiting.  Genitourinary: Negative for dysuria, frequency and hematuria.  Musculoskeletal: Negative for back pain, joint pain and neck pain.  Skin:       No acne, rash, or lesions  Neurological: Negative for dizziness, tremors, focal weakness and weakness.       Confusion and lethargy  Endo/Heme/Allergies: Negative for polydipsia. Does not bruise/bleed easily.  Psychiatric/Behavioral:  Negative for depression. The patient is not nervous/anxious and does not have insomnia.     MEDICATIONS AT HOME:   Prior to Admission medications   Medication Sig Start Date End Date Taking? Authorizing Provider  acetaminophen (TYLENOL) 160 MG/5ML liquid Take 325 mg by mouth daily as needed for fever (knee pain or headache).   Yes Historical Provider, MD  amLODipine (NORVASC) 5 MG tablet TAKE 1 TABLET (5 MG TOTAL) BY MOUTH DAILY. 01/27/15  Yes Darlin Coco, MD  ezetimibe (ZETIA) 10 MG tablet Take 1 tablet (10 mg total) by  mouth daily. 09/04/15  Yes Thayer Headings, MD  meloxicam (MOBIC) 7.5 MG tablet TAKE 1 TABLET (7.5 MG TOTAL) BY MOUTH DAILY. 11/14/15  Yes Crecencio Mc, MD  Multiple Vitamins-Minerals (OCUVITE PO) Take 1 tablet by mouth daily.    Yes Historical Provider, MD  niacin (NIASPAN) 500 MG CR tablet Take 2 tablets (1,000 mg total) by mouth at bedtime. 02/12/11  Yes Darlin Coco, MD  PARoxetine (PAXIL) 10 MG tablet Take 10 mg by mouth daily.   Yes Historical Provider, MD  QUEtiapine (SEROQUEL) 25 MG tablet Take 0.5 tablets (12.5 mg total) by mouth at bedtime. 02/20/16  Yes Crecencio Mc, MD  Calcium Carbonate-Vitamin D (CALCIUM 600+D) 600-200 MG-UNIT TABS Take 1 tablet by mouth daily.     Historical Provider, MD  fexofenadine (ALLEGRA) 60 MG tablet Take 60 mg by mouth daily. 08/04/10 07/30/15  Darlin Coco, MD  GuaiFENesin (MUCINEX CHILDRENS PO) Take 1 Dose by mouth daily as needed (cough).     Historical Provider, MD  lisinopril (PRINIVIL,ZESTRIL) 20 MG tablet Take 10 mg by mouth daily. 01/09/13   Historical Provider, MD  multivitamin [facility]) per tablet Take 1 tablet by mouth daily.      Historical Provider, MD  Nutritional Supplements (ENSURE PO) Take by mouth at bedtime.     Historical Provider, MD  Omega-3 Fatty Acids (FISH OIL) 1200 MG CAPS Take 1 tablet by mouth daily.    Historical Provider, MD  promethazine (PHENERGAN) 25 MG tablet Take 25 mg by mouth as needed. For nausea      Historical Provider, MD      VITAL SIGNS:   Vitals:   02/23/16 2345 02/24/16 0000 02/24/16 0015 02/24/16 0030  BP: (!) 150/62 (!) 148/59 (!) 135/113 (!) 142/62  Pulse: 83 87 82 85  Resp: 20 (!) 24 (!) 27 (!) 23  Temp:      TempSrc:      SpO2: 96% 96% 95% 95%  Weight:      Height:       Wt Readings from Last 3 Encounters:  02/23/16 56.7 kg (125 lb)  02/20/16 56.3 kg (124 lb 3.2 oz)  08/21/15 54.3 kg (119 lb 12 oz)    PHYSICAL EXAMINATION:  Physical Exam  Vitals reviewed. Constitutional: She is  oriented to person, place, and time. She appears well-developed and well-nourished. No distress.  HENT:  Head: Normocephalic and atraumatic.  Mouth/Throat: Oropharynx is clear and moist.  Eyes: Conjunctivae and EOM are normal. Pupils are equal, round, and reactive to light. No scleral icterus.  Neck: Normal range of motion. Neck supple. No JVD present. No thyromegaly present.  Cardiovascular: Normal rate, regular rhythm and intact distal pulses.  Exam reveals no gallop and no friction rub.   No murmur heard. Respiratory: Effort normal and breath sounds normal. No respiratory distress. She has no wheezes. She has no rales.  GI: Soft. Bowel sounds are normal. She exhibits  no distension. There is no tenderness.  Musculoskeletal: Normal range of motion. She exhibits no edema.  No arthritis, no gout  Lymphadenopathy:    She has no cervical adenopathy.  Neurological: She is alert and oriented to person, place, and time. No cranial nerve deficit.  No focal neuro deficit. No dysarthria, no aphasia  Skin: Skin is warm and dry. No rash noted. No erythema.  Psychiatric: She has a normal mood and affect. Her behavior is normal. Judgment and thought content normal.     LABORATORY PANEL:   CBC  Recent Labs Lab 02/23/16 1821  WBC 9.9  HGB 10.3*  HCT 29.8*  PLT 294   ------------------------------------------------------------------------------------------------------------------  Chemistries   Recent Labs Lab 02/23/16 1821  NA 133*  K 4.4  CL 102  CO2 23  GLUCOSE 104*  BUN 46*  CREATININE 3.01*  CALCIUM 8.5*  AST 22  ALT 14  ALKPHOS 76  BILITOT 0.5   ------------------------------------------------------------------------------------------------------------------  Cardiac Enzymes No results for input(s): TROPONINI in the last 168 hours. ------------------------------------------------------------------------------------------------------------------  RADIOLOGY:  Dg Chest 2  View  Result Date: 02/23/2016 CLINICAL DATA:  Altered mental status and weakness. EXAM: CHEST  2 VIEW COMPARISON:  08/15/2007 FINDINGS: Mild cardiomegaly with aortic atherosclerosis. Mild diffuse age related interstitial prominence without alveolar consolidations, pneumothorax nor effusion. Tiny nodular density is seen at the left lung apex unchanged from 2009 and likely benign finding. Vascular versus pleural calcifications at the right lung apex. Benign appearing subcortical cyst of the right humeral head superolaterally. IMPRESSION: Stable borderline cardiomegaly with aortic atherosclerosis. Stable left apical nodular density since 2009 consistent with a benign finding. No acute pulmonary disease. Electronically Signed   By: Ashley Royalty M.D.   On: 02/23/2016 20:02   Ct Head Wo Contrast  Result Date: 02/23/2016 CLINICAL DATA:  Somnolence EXAM: CT HEAD WITHOUT CONTRAST TECHNIQUE: Contiguous axial images were obtained from the base of the skull through the vertex without intravenous contrast. COMPARISON:  05/02/2014 CT FINDINGS: BRAIN: There is mild sulcal prominence consistent with superficial atrophy. No intraparenchymal hemorrhage, mass effect nor midline shift. There are chronic appearing patchy periventricular and subcortical white matter hypodensities consistent with mild small vessel ischemic disease. The ventricles are not significantly dilated. No acute large vascular territory infarcts. No abnormal extra-axial fluid collections. Basal cisterns are not effaced. Right-sided tentorial calcification. VASCULAR: Moderate calcific atherosclerosis of the carotid siphons. SKULL: No skull fracture. No significant scalp soft tissue swelling. SINUSES/ORBITS: The mastoid air-cells are clear. The included paranasal sinuses are well-aerated.The included ocular globes and orbital contents are non-suspicious. Bilateral lens replacements surgeries. OTHER: Mild asymmetric soft tissue swelling of the right eyelid relative  to left. IMPRESSION: Chronic stable superficial atrophy with chronic small vessel ischemic disease of periventricular and subcortical white matter. No acute intracranial abnormality. Soft tissue swelling of the right eyelid. Electronically Signed   By: Ashley Royalty M.D.   On: 02/23/2016 21:58    EKG:   Orders placed or performed during the hospital encounter of 02/23/16  . ED EKG  . ED EKG  . EKG 12-Lead  . EKG 12-Lead    IMPRESSION AND PLAN:  Principal Problem:   Acute encephalopathy - seems to be waxing and waning in nature, patient needs MRI in order to rule out stroke. We've ordered this from the ED. Active Problems:   Acute on chronic renal failure (HCC) - creatinine increases from a baseline of 2.5-3, represents less significant decrease in GFR. Likely due to poor by mouth intake today. This  can be rechecked on an outpatient basis after hydration if the patient does not require admission after MRI   Essential hypertension - continue home meds   HLD (hyperlipidemia) - continue home meds   Generalized anxiety disorder - continue home meds  All the records are reviewed and case discussed with ED provider. Management plans discussed with the patient and/or family.  CODE STATUS: DNR Code Status History    This patient does not have a recorded code status. Please follow your organizational policy for patients in this situation.      TOTAL TIME TAKING CARE OF THIS PATIENT: 40 minutes.    Cindy Harvey 02/24/2016, 12:48 AM  [facility]  Office  [phone]  CC: Primary care Physician: Crecencio Mc, MD

## 2016-02-24 NOTE — Progress Notes (Addendum)
Advance care planning  Patient continues to be drowsy. But with daughter in the room.  She mentions patient recently moved to assisted living facility as daughter was unable to care for her including to other sick family members. She was started on Seroquel recently but did not start this. Acute changes noticed and patient was brought to the emergency room. Updated her on the MRI results showing multiple right-sided acute CVA.  Discussed regarding CODE STATUS and daughter confirms she is DO NOT RESUSCITATE. Does not want a PEG tube.  At this time we will wait to see if patient improves while Ativan wears off which was given for MRI. Further discussion regarding discharge and care tomorrow.  Time spent 20 minutes

## 2016-02-24 NOTE — Plan of Care (Signed)
Problem: Education: Goal: Knowledge of disease or condition will improve Outcome: Not Progressing Stroke education booklet placed at bedside. Education unable to be provided at this time; patient remains lethargic and no family is present at bedside.

## 2016-02-24 NOTE — Care Management Obs Status (Signed)
MEDICARE OBSERVATION STATUS NOTIFICATION   Patient Details  Name: Cindy Harvey MRN: [id number] Date of Birth: 24-Nov-1916   Medicare Observation Status Notification Given:  Yes Voice mail with daughter Karie Kirks.    Shelbie Ammons, RN 02/24/2016, 3:12 PM

## 2016-02-24 NOTE — Care Management (Signed)
Admitted to [facility] with the diagnosis of acute encephalopathy under observation status. A resident of [facility] since 10/13/15. Daughter is Karie Kirks [phone]). Last seen Dr. Derrel Nip 02/20/16. Left voice mail about Observation Notice with daughter Karie Kirks.  Shelbie Ammons RN MSN CCM Care Management

## 2016-02-24 NOTE — Progress Notes (Signed)
Assumed care of patient from Andre Lefort, RN at [phone]. Patient asleep but arousable. Madlyn Frankel, RN

## 2016-02-24 NOTE — H&P (Addendum)
[facility] NAME: Cindy Harvey    MR#:  [id number]  DATE OF BIRTH:  13-Apr-1916  DATE OF ADMISSION:  02/23/2016  PRIMARY CARE PHYSICIAN: Crecencio Mc, MD   REQUESTING/REFERRING PHYSICIAN: Burlene Arnt, MD  CHIEF COMPLAINT:      Chief Complaint  Patient presents with  . Altered Mental Status    HISTORY OF PRESENT ILLNESS:  Cindy Harvey  is a 83 y.o. female who presents with Persistent confusion throughout the day today. Patient's family states the patient was very lethargic, she slept all day missing several meals which is very characteristic for her. Initially hospitalists were called with some concern for possible stroke as the rest of the patient's workup in the ED was largely negative. On evaluation the patient herself is alert and oriented, with no focal deficits on exam. She states that she does not wish to be admitted to the hospital.  Patient has significant family history for stroke and the discussion was had with her about the importance of ruling out stroke. She is oriented, but does get confused during parts of the conversation. Patient states she was not sleeping well recently and that is why she slept throughout the day today, but family states that she was intermittently confused and very lethargic.  PAST MEDICAL HISTORY:       Past Medical History:  Diagnosis Date  . Allergy    allergic rhinitis  . Hearing loss   . History of shingles   . Hypercholesterolemia   . Hypertension   . Macular degeneration   . Osteoarthritis    right knee  . Pneumonia 2009   with dehydration and electrolyte imbalance  . Urinary incontinence     PAST SURGICAL HISTOIRY:        Past Surgical History:  Procedure Laterality Date  . TONSILLECTOMY  1928    SOCIAL HISTORY:       Social History  Substance Use Topics  . Smoking status: Never Smoker  . Smokeless tobacco: Not on file  . Alcohol  use No    FAMILY HISTORY:   Family History  Problem Relation Age of Onset  . Heart disease Mother   . Stroke Mother   . Stroke Sister   . Cancer Maternal Aunt     breast CA  . Cancer Cousin     breast cancer    DRUG ALLERGIES:        Allergies  Allergen Reactions  . Asa Buff (Mag [Buffered Aspirin] Other (See Comments)    Nose bleeds  . Lipitor [Atorvastatin Calcium] Other (See Comments)    Headaches & dizzy  . Seroquel [Quetiapine Fumarate] Other (See Comments)    Lethargy  . Zocor [Simvastatin - High Dose] Other (See Comments)    myalgia    REVIEW OF SYSTEMS:  Review of Systems  Constitutional: Negative for chills, fever, malaise/fatigue and weight loss.  HENT: Negative for ear pain, hearing loss and tinnitus.   Eyes: Negative for blurred vision, double vision, pain and redness.  Respiratory: Negative for cough, hemoptysis and shortness of breath.   Cardiovascular: Negative for chest pain, palpitations, orthopnea and leg swelling.  Gastrointestinal: Negative for abdominal pain, constipation, diarrhea, nausea and vomiting.  Genitourinary: Negative for dysuria, frequency and hematuria.  Musculoskeletal: Negative for back pain, joint pain and neck pain.  Skin:       No acne, rash, or lesions  Neurological: Negative for dizziness, tremors, focal weakness and  weakness.       Confusion and lethargy  Endo/Heme/Allergies: Negative for polydipsia. Does not bruise/bleed easily.  Psychiatric/Behavioral: Negative for depression. The patient is not nervous/anxious and does not have insomnia.     MEDICATIONS AT HOME:          Prior to Admission medications   Medication Sig Start Date End Date Taking? Authorizing Provider  acetaminophen (TYLENOL) 160 MG/5ML liquid Take 325 mg by mouth daily as needed for fever (knee pain or headache).   Yes Historical Provider, MD  amLODipine (NORVASC) 5 MG tablet TAKE 1 TABLET (5 MG TOTAL) BY MOUTH DAILY.  01/27/15  Yes Darlin Coco, MD  ezetimibe (ZETIA) 10 MG tablet Take 1 tablet (10 mg total) by mouth daily. 09/04/15  Yes Thayer Headings, MD  meloxicam (MOBIC) 7.5 MG tablet TAKE 1 TABLET (7.5 MG TOTAL) BY MOUTH DAILY. 11/14/15  Yes Crecencio Mc, MD  Multiple Vitamins-Minerals (OCUVITE PO) Take 1 tablet by mouth daily.    Yes Historical Provider, MD  niacin (NIASPAN) 500 MG CR tablet Take 2 tablets (1,000 mg total) by mouth at bedtime. 02/12/11  Yes Darlin Coco, MD  PARoxetine (PAXIL) 10 MG tablet Take 10 mg by mouth daily.   Yes Historical Provider, MD  QUEtiapine (SEROQUEL) 25 MG tablet Take 0.5 tablets (12.5 mg total) by mouth at bedtime. 02/20/16  Yes Crecencio Mc, MD  Calcium Carbonate-Vitamin D (CALCIUM 600+D) 600-200 MG-UNIT TABS Take 1 tablet by mouth daily.     Historical Provider, MD  fexofenadine (ALLEGRA) 60 MG tablet Take 60 mg by mouth daily. 08/04/10 07/30/15  Darlin Coco, MD  GuaiFENesin (MUCINEX CHILDRENS PO) Take 1 Dose by mouth daily as needed (cough).     Historical Provider, MD  lisinopril (PRINIVIL,ZESTRIL) 20 MG tablet Take 10 mg by mouth daily. 01/09/13   Historical Provider, MD  multivitamin [facility]) per tablet Take 1 tablet by mouth daily.      Historical Provider, MD  Nutritional Supplements (ENSURE PO) Take by mouth at bedtime.     Historical Provider, MD  Omega-3 Fatty Acids (FISH OIL) 1200 MG CAPS Take 1 tablet by mouth daily.    Historical Provider, MD  promethazine (PHENERGAN) 25 MG tablet Take 25 mg by mouth as needed. For nausea      Historical Provider, MD      VITAL SIGNS:         Vitals:   02/23/16 2345 02/24/16 0000 02/24/16 0015 02/24/16 0030  BP: (!) 150/62 (!) 148/59 (!) 135/113 (!) 142/62  Pulse: 83 87 82 85  Resp: 20 (!) 24 (!) 27 (!) 23  Temp:      TempSrc:      SpO2: 96% 96% 95% 95%  Weight:      Height:          Wt Readings from Last 3 Encounters:  02/23/16 56.7 kg (125  lb)  02/20/16 56.3 kg (124 lb 3.2 oz)  08/21/15 54.3 kg (119 lb 12 oz)    PHYSICAL EXAMINATION:  Physical Exam  Vitals reviewed. Constitutional: She is oriented to person, place, and time. She appears well-developed and well-nourished. No distress.  HENT:  Head: Normocephalic and atraumatic.  Mouth/Throat: Oropharynx is clear and moist.  Eyes: Conjunctivae and EOM are normal. Pupils are equal, round, and reactive to light. No scleral icterus.  Neck: Normal range of motion. Neck supple. No JVD present. No thyromegaly present.  Cardiovascular: Normal rate, regular rhythm and intact distal pulses.  Exam reveals no  gallop and no friction rub.   No murmur heard. Respiratory: Effort normal and breath sounds normal. No respiratory distress. She has no wheezes. She has no rales.  GI: Soft. Bowel sounds are normal. She exhibits no distension. There is no tenderness.  Musculoskeletal: Normal range of motion. She exhibits no edema.  No arthritis, no gout  Lymphadenopathy:    She has no cervical adenopathy.  Neurological: She is alert and oriented to person, place, and time. No cranial nerve deficit.  No focal neuro deficit. No dysarthria, no aphasia  Skin: Skin is warm and dry. No rash noted. No erythema.  Psychiatric: She has a normal mood and affect. Her behavior is normal. Judgment and thought content normal.     LABORATORY PANEL:   CBC  Last Labs    Recent Labs Lab 02/23/16 1821  WBC 9.9  HGB 10.3*  HCT 29.8*  PLT 294     ------------------------------------------------------------------------------------------------------------------  Chemistries   Last Labs    Recent Labs Lab 02/23/16 1821  NA 133*  K 4.4  CL 102  CO2 23  GLUCOSE 104*  BUN 46*  CREATININE 3.01*  CALCIUM 8.5*  AST 22  ALT 14  ALKPHOS 76  BILITOT 0.5     ------------------------------------------------------------------------------------------------------------------  Cardiac  Enzymes Last Labs   No results for input(s): TROPONINI in the last 168 hours.   ------------------------------------------------------------------------------------------------------------------  RADIOLOGY:   Imaging Results (Last 48 hours)  Dg Chest 2 View  Result Date: 02/23/2016 CLINICAL DATA:  Altered mental status and weakness. EXAM: CHEST  2 VIEW COMPARISON:  08/15/2007 FINDINGS: Mild cardiomegaly with aortic atherosclerosis. Mild diffuse age related interstitial prominence without alveolar consolidations, pneumothorax nor effusion. Tiny nodular density is seen at the left lung apex unchanged from 2009 and likely benign finding. Vascular versus pleural calcifications at the right lung apex. Benign appearing subcortical cyst of the right humeral head superolaterally. IMPRESSION: Stable borderline cardiomegaly with aortic atherosclerosis. Stable left apical nodular density since 2009 consistent with a benign finding. No acute pulmonary disease. Electronically Signed   By: Ashley Royalty M.D.   On: 02/23/2016 20:02   Ct Head Wo Contrast  Result Date: 02/23/2016 CLINICAL DATA:  Somnolence EXAM: CT HEAD WITHOUT CONTRAST TECHNIQUE: Contiguous axial images were obtained from the base of the skull through the vertex without intravenous contrast. COMPARISON:  05/02/2014 CT FINDINGS: BRAIN: There is mild sulcal prominence consistent with superficial atrophy. No intraparenchymal hemorrhage, mass effect nor midline shift. There are chronic appearing patchy periventricular and subcortical white matter hypodensities consistent with mild small vessel ischemic disease. The ventricles are not significantly dilated. No acute large vascular territory infarcts. No abnormal extra-axial fluid collections. Basal cisterns are not effaced. Right-sided tentorial calcification. VASCULAR: Moderate calcific atherosclerosis of the carotid siphons. SKULL: No skull fracture. No significant scalp soft tissue swelling.  SINUSES/ORBITS: The mastoid air-cells are clear. The included paranasal sinuses are well-aerated.The included ocular globes and orbital contents are non-suspicious. Bilateral lens replacements surgeries. OTHER: Mild asymmetric soft tissue swelling of the right eyelid relative to left. IMPRESSION: Chronic stable superficial atrophy with chronic small vessel ischemic disease of periventricular and subcortical white matter. No acute intracranial abnormality. Soft tissue swelling of the right eyelid. Electronically Signed   By: Ashley Royalty M.D.   On: 02/23/2016 21:58     EKG:      Orders placed or performed during the hospital encounter of 02/23/16  . ED EKG  . ED EKG  . EKG 12-Lead  . EKG 12-Lead  IMPRESSION AND PLAN:  Principal Problem:   Acute encephalopathy - seems to be waxing and waning in nature, patient needs MRI in order to rule out stroke. Active Problems:   Acute on chronic renal failure (HCC) - creatinine increases from a baseline of 2.5-3, represents less significant decrease in GFR. Likely due to poor by mouth intake today. IV fluids and recheck in AM   Essential hypertension - continue home meds   HLD (hyperlipidemia) - continue home meds   Generalized anxiety disorder - continue home meds  All the records are reviewed and case discussed with ED provider. Management plans discussed with the patient and/or family.  CODE STATUS: DNR    Code Status History    This patient does not have a recorded code status. Please follow your organizational policy for patients in this situation.      TOTAL TIME TAKING CARE OF THIS PATIENT: 40 minutes.    Sylwia Cuervo [place] 02/24/2016, 12:48 AM  [facility]  Office  [phone]  CC: Primary care Physician: Crecencio Mc, MD

## 2016-02-25 ENCOUNTER — Observation Stay: Payer: Medicare Other

## 2016-02-25 DIAGNOSIS — N179 Acute kidney failure, unspecified: Secondary | ICD-10-CM | POA: Diagnosis present

## 2016-02-25 DIAGNOSIS — Z886 Allergy status to analgesic agent status: Secondary | ICD-10-CM | POA: Diagnosis not present

## 2016-02-25 DIAGNOSIS — I129 Hypertensive chronic kidney disease with stage 1 through stage 4 chronic kidney disease, or unspecified chronic kidney disease: Secondary | ICD-10-CM | POA: Diagnosis present

## 2016-02-25 DIAGNOSIS — I639 Cerebral infarction, unspecified: Secondary | ICD-10-CM | POA: Diagnosis present

## 2016-02-25 DIAGNOSIS — F411 Generalized anxiety disorder: Secondary | ICD-10-CM | POA: Diagnosis present

## 2016-02-25 DIAGNOSIS — Z7902 Long term (current) use of antithrombotics/antiplatelets: Secondary | ICD-10-CM | POA: Diagnosis not present

## 2016-02-25 DIAGNOSIS — J111 Influenza due to unidentified influenza virus with other respiratory manifestations: Secondary | ICD-10-CM | POA: Diagnosis present

## 2016-02-25 DIAGNOSIS — Z791 Long term (current) use of non-steroidal anti-inflammatories (NSAID): Secondary | ICD-10-CM | POA: Diagnosis not present

## 2016-02-25 DIAGNOSIS — R4182 Altered mental status, unspecified: Secondary | ICD-10-CM | POA: Diagnosis not present

## 2016-02-25 DIAGNOSIS — I63032 Cerebral infarction due to thrombosis of left carotid artery: Secondary | ICD-10-CM | POA: Diagnosis not present

## 2016-02-25 DIAGNOSIS — G9340 Encephalopathy, unspecified: Secondary | ICD-10-CM | POA: Diagnosis present

## 2016-02-25 DIAGNOSIS — I10 Essential (primary) hypertension: Secondary | ICD-10-CM | POA: Diagnosis not present

## 2016-02-25 DIAGNOSIS — I6340 Cerebral infarction due to embolism of unspecified cerebral artery: Secondary | ICD-10-CM | POA: Diagnosis present

## 2016-02-25 DIAGNOSIS — F0390 Unspecified dementia without behavioral disturbance: Secondary | ICD-10-CM | POA: Diagnosis present

## 2016-02-25 DIAGNOSIS — E785 Hyperlipidemia, unspecified: Secondary | ICD-10-CM | POA: Diagnosis present

## 2016-02-25 DIAGNOSIS — Z79899 Other long term (current) drug therapy: Secondary | ICD-10-CM | POA: Diagnosis not present

## 2016-02-25 DIAGNOSIS — J101 Influenza due to other identified influenza virus with other respiratory manifestations: Secondary | ICD-10-CM | POA: Diagnosis present

## 2016-02-25 DIAGNOSIS — Z66 Do not resuscitate: Secondary | ICD-10-CM | POA: Diagnosis present

## 2016-02-25 DIAGNOSIS — Z888 Allergy status to other drugs, medicaments and biological substances status: Secondary | ICD-10-CM | POA: Diagnosis not present

## 2016-02-25 DIAGNOSIS — N189 Chronic kidney disease, unspecified: Secondary | ICD-10-CM | POA: Diagnosis present

## 2016-02-25 DIAGNOSIS — G4700 Insomnia, unspecified: Secondary | ICD-10-CM | POA: Diagnosis present

## 2016-02-25 DIAGNOSIS — I63031 Cerebral infarction due to thrombosis of right carotid artery: Secondary | ICD-10-CM | POA: Diagnosis not present

## 2016-02-25 MED ORDER — CLOPIDOGREL BISULFATE 75 MG PO TABS
75.0000 mg | ORAL_TABLET | Freq: Every day | ORAL | Status: DC
Start: 2016-02-25 — End: 2016-02-26
  Administered 2016-02-25 – 2016-02-26 (×2): 75 mg via ORAL
  Filled 2016-02-25 (×2): qty 1

## 2016-02-25 MED ORDER — QUETIAPINE FUMARATE 25 MG PO TABS
25.0000 mg | ORAL_TABLET | Freq: Every day | ORAL | Status: DC
  Filled 2016-02-25: qty 1

## 2016-02-25 NOTE — Evaluation (Signed)
Clinical/Bedside Swallow Evaluation Patient Details  Name: Cindy Harvey MRN: [id number] Date of Birth: [birth year]  Today's Date: 02/25/2016 Time: SLP Start Time (ACUTE ONLY): 1000 SLP Stop Time (ACUTE ONLY): 1100 SLP Time Calculation (min) (ACUTE ONLY): 60 min  Past Medical History:  Past Medical History:  Diagnosis Date  . Allergy    allergic rhinitis  . Hearing loss   . History of shingles   . Hypercholesterolemia   . Hypertension   . Macular degeneration   . Osteoarthritis    right knee  . Pneumonia 2009   with dehydration and electrolyte imbalance  . Urinary incontinence    Past Surgical History:  Past Surgical History:  Procedure Laterality Date  . TONSILLECTOMY  1928   HPI:  Pt is a 83 y.o. female w/ PMH including Dementia who presents today complaining of being fatigued. According to family, especially her daughter who is a Marine scientist, she has been somnolent all day and missing meals. No other focal pathology as been detected. No other complaints. Patient herself feels "okay". There is no recent urinary tract infection no dysuria no urinary frequency no headache, he does have a slight cough. When she is awoken, she is seems to be at her baseline but she's been much more somnolent than usual. On evaluation in the ED, the patient herself is alert and oriented, with no focal deficits on exam. She states that she does not wish to be admitted to the hospital.  Patient has significant family history for stroke and the discussion was had with her about the importance of ruling out stroke. She is oriented, but does get confused during parts of the conversation. Per family MD notes, Family has relocated her to assisted living at [facility] in the past few months. She has not adjusted well, per daughter. Per Daughter, she has not started to socialize yet with the other residents, and has been barricading herself in her private room. She is not sleeping at night and sleeping during the day. Pt  is HOH but was able to answer few basic questions re: self(past history) and follow basic commands.    Assessment / Plan / Recommendation Clinical Impression  Pt appears to present w/ reduced risk for aspiration, however, d/t currently declined Cognitive status, slight risk for aspiration is present. Pt consumed po trials of thin liquids and purees/soft solid(1) w/ no overt s/s of aspiration noted. However, pt accepted po trials w/ closed eyes and required mod verbal/tactile cues during engagement. She made no attempt to feed self. Oral phase appeared wfl for bolus management and clearing. Pt was not interested in many po trials but stated she "liked" ice cream(Magic Cup supplement ice cream was given). Recommend a Dysphagia level 3 diet w/ thin liquids w/ strict aspiration precautions and feeding support; Pills given in puree - Crushed as able at this time. ST will f/u w/ pt's status and toleration of diet consistency for any need to downgrade the consistency. NSG updated.      Aspiration Risk   (reduced following aspiration precautions)    Diet Recommendation  Dysphagia level 3 w/ thin liquids; strict aspiration precautions and feeding support at meals.   Medication Administration: Crushed with puree    Other  Recommendations Recommended Consults:  (Dietician consult) Oral Care Recommendations: Oral care BID;Staff/trained caregiver to provide oral care   Follow up Recommendations None      Frequency and Duration min 1 x/week  1 week       Prognosis Prognosis  for Safe Diet Advancement: Good Barriers to Reach Goals: Cognitive deficits;Severity of deficits;Behavior      Swallow Study   General Date of Onset: 02/23/16 HPI: Pt is a 83 y.o. female w/ PMH including Dementia who presents today complaining of being fatigued. According to family, especially her daughter who is a Marine scientist, she has been somnolent all day and missing meals. No other focal pathology as been detected. No other  complaints. Patient herself feels "okay". There is no recent urinary tract infection no dysuria no urinary frequency no headache, he does have a slight cough. When she is awoken, she is seems to be at her baseline but she's been much more somnolent than usual. On evaluation in the ED, the patient herself is alert and oriented, with no focal deficits on exam. She states that she does not wish to be admitted to the hospital.  Patient has significant family history for stroke and the discussion was had with her about the importance of ruling out stroke. She is oriented, but does get confused during parts of the conversation. Per family MD notes, Family has relocated her to assisted living at [facility] in the past few months. She has not adjusted well, per daughter. Per Daughter, she has not started to socialize yet with the other residents, and has been barricading herself in her private room. She is not sleeping at night and sleeping during the day. Pt is HOH but was able to answer few basic questions re: self(past history) and follow basic commands.  Type of Study: Bedside Swallow Evaluation Previous Swallow Assessment: none indicated Diet Prior to this Study: Regular;Thin liquids Temperature Spikes Noted: Yes (spiked today; wbc not elevated) Respiratory Status: Nasal cannula (2 liters) History of Recent Intubation: No Behavior/Cognition: Alert;Cooperative;Pleasant mood;Confused;Distractible;Requires cueing Oral Cavity Assessment: Within Functional Limits;Dry Oral Care Completed by SLP: Recent completion by staff Oral Cavity - Dentition: Dentures, top;Dentures, bottom (in place) Vision:  (n/a) Self-Feeding Abilities: Total assist Patient Positioning: Postural control adequate for testing Baseline Vocal Quality: Normal (reduced verbal output/responses) Volitional Cough: Cognitively unable to elicit Volitional Swallow: Unable to elicit    Oral/Motor/Sensory Function Overall Oral Motor/Sensory  Function: Within functional limits (limited exam d/t Cognitive status)   Ice Chips Ice chips: Not tested   Thin Liquid Thin Liquid: Within functional limits Presentation: Straw (supported; 8 trials) Other Comments: juice and water    Nectar Thick Nectar Thick Liquid: Not tested   Honey Thick Honey Thick Liquid: Not tested   Puree Puree: Within functional limits Presentation: Spoon (fed; 10 trials)   Solid   GO   Solid: Within functional limits (1 trial of mech soft food) Presentation: Spoon (fed; 1 trial) Other Comments: peach bolus    Functional Assessment Tool Used: clinical judgement Functional Limitations: Swallowing Swallow Current Status [id number]): At least 1 percent but less than 20 percent impaired, limited or restricted Swallow Goal Status [phone]): At least 1 percent but less than 20 percent impaired, limited or restricted Swallow Discharge Status [phone]): At least 1 percent but less than 20 percent impaired, limited or restricted     Orinda Kenner, MS, CCC-SLP Watson,Katherine 02/25/2016,12:32 PM

## 2016-02-25 NOTE — Evaluation (Signed)
Physical Therapy Evaluation Patient Details Name: Cindy Harvey MRN: [id number] DOB: [birth year] Today's Date: 02/25/2016   History of Present Illness  Pt is a 83 y.o. female presenting to hospital with AMS and lethargy.  MRI demonstrating multiple R sided acute CVA's.  PMH includes hearing loss, htn, and dementia.  Clinical Impression  Pt's PLOF prior to admission not clear (no family present to determine PLOF; pt reporting that she was walking with AD sometimes but also reported she was still driving--question accuracy of pt's answers).  Per chart and CM pt lives at [facility].  Currently pt requires 2 assist for bed mobility and was close SBA briefly but mostly mod assist for sitting balance (pt leaning to R).  Pt initially lethargic but briefly opened eyes laying in bed and stated "I'm cold" when therapist brought pt's sheets down.  Upon sitting pt on edge of bed pt's eyes were fully opened and was communicative with therapist (blanked wrapped around pt in sitting d/t pt c/o being cold).  Pt fatigued with sitting on edge of bed and requested to lay back down with blankets over her and promptly fell back asleep upon laying back down in bed.  Pt would benefit from skilled PT to address noted impairments and functional limitations.  Recommend pt discharge to STR if pt is below functional baseline (d/t unsure of pt's PLOF).    Follow Up Recommendations SNF (pending prior level of function)    Equipment Recommendations   (to be determined at post acute setting)    Recommendations for Other Services       Precautions / Restrictions Precautions Precautions: Fall Restrictions Weight Bearing Restrictions: No      Mobility  Bed Mobility Overal bed mobility: Needs Assistance Bed Mobility: Supine to Sit;Sit to Supine     Supine to sit: Max assist;+2 for physical assistance;HOB elevated Sit to supine: +2 for physical assistance;HOB elevated;Mod assist   General bed mobility comments:  assist for trunk and B LE's (more assist for supine to sit d/t pt drowsy with eyes closed initially but pt awake and alert once sitting on edge of bed  Transfers                 General transfer comment: Deferred d/t pt fatigued sitting on edge of bed and requesting to lay back down to go back to sleep  Ambulation/Gait                Stairs            Wheelchair Mobility    Modified Rankin (Stroke Patients Only)       Balance Overall balance assessment: Needs assistance Sitting-balance support: Bilateral upper extremity supported;Feet supported Sitting balance-Leahy Scale: Poor Sitting balance - Comments: R lean requiring close SBA briefly but mostly mod assist to maintain upright Postural control: Right lateral lean                                   Pertinent Vitals/Pain Pain Assessment: Faces Faces Pain Scale: No hurt  Vitals (HR and O2 on 2 L via nasal cannula) stable and WFL throughout treatment session.     Home Living Family/patient expects to be discharged to:: Assisted living               Home Equipment:  (Pt not able to verbalize home equipment) Additional Comments: [facility]    Prior Function  Comments: Pt reports walking with AD when needed but pt also reported she was still driving.  No family present to determine PLOF.     Hand Dominance        Extremity/Trunk Assessment   Upper Extremity Assessment Upper Extremity Assessment: Difficult to assess due to impaired cognition (Good B hand grip strength; pt able to reach to grossly 80 degrees L shoulder flexion and 60 degrees R shoulder flexion)    Lower Extremity Assessment Lower Extremity Assessment: Difficult to assess due to impaired cognition (R hip flexion at least 3+/5; R knee flexion/extension at least 3/5; pt did not follow commands for L hip flexion; L knee flexion/extension at least 3/5; pt did not follow commands well to test strength  in general or to test ankle AROM)       Communication   Communication: HOH  Cognition Arousal/Alertness: Lethargic Behavior During Therapy:  (alert when sitting on edge of bed) Overall Cognitive Status: No family/caregiver present to determine baseline cognitive functioning (Oriented to person and month/day of birth (not year))                 General Comments: Pt sleeping in bed upon PT arrival.    General Comments   Nursing cleared pt for participation in physical therapy.  Pt agreeable to brief PT session once she was alert and talking sitting on edge of bed.    Exercises     Assessment/Plan    PT Assessment Patient needs continued PT services  PT Problem List Decreased strength;Decreased activity tolerance;Decreased balance;Decreased mobility          PT Treatment Interventions DME instruction;Gait training;Functional mobility training;Therapeutic activities;Therapeutic exercise;Balance training;Neuromuscular re-education;Patient/family education    PT Goals (Current goals can be found in the Care Plan section)  Acute Rehab PT Goals Patient Stated Goal: to go back to sleep and be warm PT Goal Formulation: With patient Time For Goal Achievement: 03/10/16 Potential to Achieve Goals: Good    Frequency 7X/week   Barriers to discharge Decreased caregiver support Possibly level of assist    Co-evaluation               End of Session Equipment Utilized During Treatment: Oxygen (2 L via nasal cannula) Activity Tolerance: Patient limited by fatigue Patient left: in bed;with call bell/phone within reach;with bed alarm set Nurse Communication: Mobility status;Precautions    Functional Assessment Tool Used: AM-PAC without stairs Functional Limitation: Mobility: Walking and moving around Mobility: Walking and Moving Around Current Status [id number]): At least 80 percent but less than 100 percent impaired, limited or restricted Mobility: Walking and Moving Around  Goal Status [phone]): At least 20 percent but less than 40 percent impaired, limited or restricted    Time: 0940-1003 PT Time Calculation (min) (ACUTE ONLY): 23 min   Charges:   PT Evaluation $PT Eval Low Complexity: 1 Procedure     PT G Codes:   PT G-Codes **NOT FOR INPATIENT CLASS** Functional Assessment Tool Used: AM-PAC without stairs Functional Limitation: Mobility: Walking and moving around Mobility: Walking and Moving Around Current Status [id number]): At least 80 percent but less than 100 percent impaired, limited or restricted Mobility: Walking and Moving Around Goal Status [phone]): At least 20 percent but less than 40 percent impaired, limited or restricted    Leitha Bleak 02/25/2016, 11:10 AM Leitha Bleak, [place]

## 2016-02-25 NOTE — Clinical Social Work Note (Signed)
CSW spoke to [facility], to determine what patient's base line is.  Cindy Harvey said patient walks with a walker short distances, and she is total care at facility, patient only feeds herself and drinks water.  CSW to continue to follow patient's progress throughout discharge planning.  Jones Broom. [place], MSW, [place]  Mon-Fri 8a-4:30p 02/25/2016 2:20 PM

## 2016-02-25 NOTE — Plan of Care (Signed)
Problem: SLP Dysphagia Goals Goal: Misc Dysphagia Goal Pt will safely tolerate po diet of least restrictive consistency w/ no overt s/s of aspiration noted by Staff/pt/family x3 sessions.    

## 2016-02-25 NOTE — Progress Notes (Signed)
Pt was febrile at 0500 at 102 degrees.  Tylenol given.  Pt back to 99.2 at 0555.

## 2016-02-25 NOTE — Consult Note (Signed)
Reason for Consult:Stroke Referring Physician: Sudini  CC: Altered mental status  HPI: Cindy Harvey is an 83 y.o. female who is a SNF resident with a history of dementia and psychosis.  Will not provide any history today and no family is present.  All history obtained from the chart.  It appears the patient was very lethargic, she slept all day missing several meals which is not necessary unusual for her. Patient stated she was not sleeping well recently and that is why she slept throughout the day today, but family did state that she was intermittently confused and very lethargic   Past Medical History:  Diagnosis Date  . Allergy    allergic rhinitis  . Hearing loss   . History of shingles   . Hypercholesterolemia   . Hypertension   . Macular degeneration   . Osteoarthritis    right knee  . Pneumonia 2009   with dehydration and electrolyte imbalance  . Urinary incontinence     Past Surgical History:  Procedure Laterality Date  . TONSILLECTOMY  1928    Family History  Problem Relation Age of Onset  . Heart disease Mother   . Stroke Mother   . Stroke Sister   . Cancer Maternal Aunt     breast CA  . Cancer Cousin     breast cancer    Social History:  reports that she has never smoked. She has never used smokeless tobacco. She reports that she does not drink alcohol or use drugs.  Allergies  Allergen Reactions  . Asa Buff (Mag [Buffered Aspirin] Other (See Comments)    Nose bleeds  . Lipitor [Atorvastatin Calcium] Other (See Comments)    Headaches & dizzy  . Seroquel [Quetiapine Fumarate] Other (See Comments)    Lethargy  . Zocor [Simvastatin - High Dose] Other (See Comments)    myalgia    Medications:  I have reviewed the patient's current medications. Prior to Admission:  Prescriptions Prior to Admission  Medication Sig Dispense Refill Last Dose  . acetaminophen (TYLENOL) 160 MG/5ML liquid Take 325 mg by mouth daily as needed for fever (knee pain or  headache).   prn  . amLODipine (NORVASC) 5 MG tablet TAKE 1 TABLET (5 MG TOTAL) BY MOUTH DAILY. 30 tablet 11 02/23/2016 at Unknown time  . ezetimibe (ZETIA) 10 MG tablet Take 1 tablet (10 mg total) by mouth daily. 30 tablet 11 02/23/2016 at Unknown time  . meloxicam (MOBIC) 7.5 MG tablet TAKE 1 TABLET (7.5 MG TOTAL) BY MOUTH DAILY. 30 tablet 5 02/23/2016 at Unknown time  . Multiple Vitamins-Minerals (OCUVITE PO) Take 1 tablet by mouth daily.    02/23/2016 at Unknown time  . niacin (NIASPAN) 500 MG CR tablet Take 2 tablets (1,000 mg total) by mouth at bedtime. 60 tablet 12 02/22/2016 at Unknown time  . PARoxetine (PAXIL) 10 MG tablet Take 10 mg by mouth daily.   02/23/2016 at Unknown time  . QUEtiapine (SEROQUEL) 25 MG tablet Take 0.5 tablets (12.5 mg total) by mouth at bedtime. 45 tablet 0 02/22/2016 at Unknown time  . Calcium Carbonate-Vitamin D (CALCIUM 600+D) 600-200 MG-UNIT TABS Take 1 tablet by mouth daily.    Not Taking at Unknown time  . fexofenadine (ALLEGRA) 60 MG tablet Take 60 mg by mouth daily.   Taking  . GuaiFENesin (MUCINEX CHILDRENS PO) Take 1 Dose by mouth daily as needed (cough).    Not Taking at Unknown time  . lisinopril (PRINIVIL,ZESTRIL) 20 MG tablet Take 10  mg by mouth daily.   Not Taking at Unknown time  . multivitamin (THERAGRAN) per tablet Take 1 tablet by mouth daily.     Not Taking at Unknown time  . Nutritional Supplements (ENSURE PO) Take by mouth at bedtime.    Not Taking at Unknown time  . Omega-3 Fatty Acids (FISH OIL) 1200 MG CAPS Take 1 tablet by mouth daily.   Not Taking at Unknown time  . promethazine (PHENERGAN) 25 MG tablet Take 25 mg by mouth as needed. For nausea     Not Taking at Unknown time   Scheduled: . amLODipine  5 mg Oral Daily  . heparin  5,000 Units Subcutaneous Q8H  . lisinopril  10 mg Oral Daily  . metoprolol tartrate  25 mg Oral BID  . PARoxetine  10 mg Oral Daily    ROS: Patient will not cooperate to provide  Physical Examination: Blood  pressure (!) 109/42, pulse (!) 59, temperature 98.6 F (37 C), temperature source Oral, resp. rate 18, height 5\' 1"  (1.549 m), weight 56.2 kg (123 lb 12.8 oz), SpO2 95 %.  HEENT-  Normocephalic, no lesions, without obvious abnormality.  Normal external eye and conjunctiva.  Normal TM's bilaterally.  Normal auditory canals and external ears. Normal external nose, mucus membranes and septum.  Normal pharynx. Cardiovascular- S1, S2 normal, pulses palpable throughout   Lungs- chest clear, no wheezing, rales, normal symmetric air entry Abdomen- soft, non-tender; bowel sounds normal; no masses,  no organomegaly Extremities- no edema Lymph-no adenopathy palpable Musculoskeletal-no joint tenderness, deformity or swelling Skin-warm and dry, no hyperpigmentation, vitiligo, or suspicious lesions  Neurological Examination Mental Status: Alert but when I attempt to interact with her she rolls on her side into a ball and will not communicate.  Does not follow commands.  Speaks only to tell me to leave but does so fluently.   Cranial Nerves: II: Discs flat bilaterally; Blinks to bilateral confrontation.  Pupils equal, round, reactive to light and accommodation III,IV, VI: ptosis not present, extra-ocular motions grossly intact bilaterally V,VII: ? Right facial droop, facial light touch sensation normal bilaterally VIII: hearing normal bilaterally IX,X: gag reflex unable to be tested XI: bilateral shoulder shrug XII: tongue extension unable to be tested Motor: Will not cooperate for formal testing but moves all extremities against gravity Sensory: Responds to light noxious stimuli throughout Deep Tendon Reflexes: 2+ and symmetric with absent AJ's bilaterally Plantars: Right: mute   Left: upgoing Cerebellar: Patient will not cooperate to perform Gait: not tested due to safety concerns    Laboratory Studies:   Basic Metabolic Panel:  Recent Labs Lab 02/23/16 1821 02/24/16 0527  NA 133* 134*   K 4.4 4.1  CL 102 105  CO2 23 22  GLUCOSE 104* 88  BUN 46* 41*  CREATININE 3.01* 2.63*  CALCIUM 8.5* 9.1    Liver Function Tests:  Recent Labs Lab 02/23/16 1821  AST 22  ALT 14  ALKPHOS 76  BILITOT 0.5  PROT 6.6  ALBUMIN 3.7   No results for input(s): LIPASE, AMYLASE in the last 168 hours. No results for input(s): AMMONIA in the last 168 hours.  CBC:  Recent Labs Lab 02/23/16 1821 02/24/16 0527  WBC 9.9 10.2  HGB 10.3* 10.6*  HCT 29.8* 31.0*  MCV 85.8 86.1  PLT 294 298    Cardiac Enzymes: No results for input(s): CKTOTAL, CKMB, CKMBINDEX, TROPONINI in the last 168 hours.  BNP: Invalid input(s): POCBNP  CBG:  Recent Labs Lab 02/23/16 1820  GLUCAP 101*    Microbiology: Results for orders placed or performed during the hospital encounter of 02/23/16  MRSA PCR Screening     Status: None   Collection Time: 02/24/16  3:40 AM  Result Value Ref Range Status   MRSA by PCR NEGATIVE NEGATIVE Final    Comment:        The GeneXpert MRSA Assay (FDA approved for NASAL specimens only), is one component of a comprehensive MRSA colonization surveillance program. It is not intended to diagnose MRSA infection nor to guide or monitor treatment for MRSA infections.   CULTURE, BLOOD (ROUTINE X 2) w Reflex to ID Panel     Status: None (Preliminary result)   Collection Time: 02/24/16 10:25 AM  Result Value Ref Range Status   Specimen Description BLOOD L HAND  Final   Special Requests   Final    BOTTLES DRAWN AEROBIC AND ANAEROBIC AER 6ML ANA 5ML   Culture NO GROWTH < 24 HOURS  Final   Report Status PENDING  Incomplete  CULTURE, BLOOD (ROUTINE X 2) w Reflex to ID Panel     Status: None (Preliminary result)   Collection Time: 02/24/16 10:34 AM  Result Value Ref Range Status   Specimen Description BLOOD R HAND  Final   Special Requests   Final    BOTTLES DRAWN AEROBIC AND ANAEROBIC AER 7ML ANA 6ML   Culture NO GROWTH < 24 HOURS  Final   Report Status PENDING   Incomplete    Coagulation Studies: No results for input(s): LABPROT, INR in the last 72 hours.  Urinalysis:  Recent Labs Lab 02/23/16 1945  COLORURINE YELLOW*  LABSPEC 1.014  PHURINE 5.0  GLUCOSEU NEGATIVE  HGBUR NEGATIVE  BILIRUBINUR NEGATIVE  KETONESUR NEGATIVE  PROTEINUR >=300*  NITRITE NEGATIVE  LEUKOCYTESUR NEGATIVE    Lipid Panel:     Component Value Date/Time   CHOL 206 (H) 02/07/2015 1011   TRIG 90 02/07/2015 1011   HDL 58 02/07/2015 1011   CHOLHDL 3.6 02/07/2015 1011   VLDL 18 02/07/2015 1011   LDLCALC 130 (H) 02/07/2015 1011    HgbA1C: No results found for: HGBA1C  Urine Drug Screen:  No results found for: LABOPIA, COCAINSCRNUR, LABBENZ, AMPHETMU, THCU, LABBARB  Alcohol Level: No results for input(s): ETH in the last 168 hours.  Other results: EKG: normal sinus rhythm with sinus arrhythmia at 82 bpm.  Imaging: Dg Chest 2 View  Result Date: 02/23/2016 CLINICAL DATA:  Altered mental status and weakness. EXAM: CHEST  2 VIEW COMPARISON:  08/15/2007 FINDINGS: Mild cardiomegaly with aortic atherosclerosis. Mild diffuse age related interstitial prominence without alveolar consolidations, pneumothorax nor effusion. Tiny nodular density is seen at the left lung apex unchanged from 2009 and likely benign finding. Vascular versus pleural calcifications at the right lung apex. Benign appearing subcortical cyst of the right humeral head superolaterally. IMPRESSION: Stable borderline cardiomegaly with aortic atherosclerosis. Stable left apical nodular density since 2009 consistent with a benign finding. No acute pulmonary disease. Electronically Signed   By: Ashley Royalty M.D.   On: 02/23/2016 20:02   Ct Head Wo Contrast  Result Date: 02/23/2016 CLINICAL DATA:  Somnolence EXAM: CT HEAD WITHOUT CONTRAST TECHNIQUE: Contiguous axial images were obtained from the base of the skull through the vertex without intravenous contrast. COMPARISON:  05/02/2014 CT FINDINGS: BRAIN: There is  mild sulcal prominence consistent with superficial atrophy. No intraparenchymal hemorrhage, mass effect nor midline shift. There are chronic appearing patchy periventricular and subcortical white matter hypodensities consistent with mild  small vessel ischemic disease. The ventricles are not significantly dilated. No acute large vascular territory infarcts. No abnormal extra-axial fluid collections. Basal cisterns are not effaced. Right-sided tentorial calcification. VASCULAR: Moderate calcific atherosclerosis of the carotid siphons. SKULL: No skull fracture. No significant scalp soft tissue swelling. SINUSES/ORBITS: The mastoid air-cells are clear. The included paranasal sinuses are well-aerated.The included ocular globes and orbital contents are non-suspicious. Bilateral lens replacements surgeries. OTHER: Mild asymmetric soft tissue swelling of the right eyelid relative to left. IMPRESSION: Chronic stable superficial atrophy with chronic small vessel ischemic disease of periventricular and subcortical white matter. No acute intracranial abnormality. Soft tissue swelling of the right eyelid. Electronically Signed   By: Ashley Royalty M.D.   On: 02/23/2016 21:58   Mr Brain Wo Contrast  Result Date: 02/24/2016 CLINICAL DATA:  Persistent lethargy.  Confusion. EXAM: MRI HEAD WITHOUT CONTRAST TECHNIQUE: Multiplanar, multiecho pulse sequences of the brain and surrounding structures were obtained without intravenous contrast. COMPARISON:  CT head 02/23/2016.  No prior MRI is available. FINDINGS: Brain: Tiny (2-3 mm) focus of restricted diffusion, RIGHT posterior frontal cortex (image 43 series 100, image 22 series 101), represents a small acute infarct. Similar sized 2-3 mm focus, RIGHT lentiform nucleus superiorly, seen only on coronal DWI, indeterminate, but favored to represent a second acute infarct. Suspected acute infarct RIGHT midbrain, 2-3 mm, adjacent to the red nucleus, see image 23 series 100, image 25 series  101. An infarct in this area could result in altered sensorium. Low level restricted diffusion RIGHT centrum semiovale (image 35 series 100, image 22 series 101, also subcentimeter size, could represent a subacute ischemic deep white matter insult. No hemorrhage, mass lesion, hydrocephalus, or extra-axial fluid. Moderate atrophy. Extensive white matter disease, likely chronic microvascular ischemic change. Prominent perivascular spaces likely sequelae of longstanding hypertension, without areas of microbleeds/hemosiderin deposition Vascular: Flow voids are maintained throughout the carotid, basilar, and vertebral arteries. Skull and upper cervical spine: Normal marrow signal. Sinuses/Orbits: Negative.  BILATERAL cataract extraction. Other: Trace RIGHT mastoid effusion. IMPRESSION: Small acute nonhemorrhagic cortical infarct RIGHT posterior frontal cortex. Suspected acute infarct RIGHT paramedian midbrain. This could correlate with altered sensorium. Probable small acute nonhemorrhagic infarct, RIGHT lentiform nucleus, seen only on coronal DWI. Possible small subacute deep white matter infarct, RIGHT centrum semiovale. Moderate atrophy with extensive small vessel disease. Electronically Signed   By: Staci Righter M.D.   On: 02/24/2016 10:43     Assessment/Plan: 83 year old female presenting with altered mental status.  MRI of the brain personally reviewed and shows multiple small, what appears to be acute infarcts, likely embolic in etiology.  LKW unable to be determined therefore patient not a tPA candidate.  Has risk factors of HTN and hyperlipidemia.  Patient on no antiplatelet therapy prior to admission.  Allergic to ASA.  Further work up recommended.   Recommendations: 1. HgbA1c, fasting lipid panel 2. PT consult, OT consult, Speech consult 3. Echocardiogram 4. Carotid dopplers 5. Prophylactic therapy-Antiplatelet med: Plavix - dose 75mg  daily 6. Telemetry monitoring 7. Frequent neuro  checks   Alexis Goodell, MD Neurology [phone] 02/25/2016, 2:17 PM

## 2016-02-25 NOTE — Progress Notes (Signed)
[facility] [place] NAME: Cindy Harvey    MR#:  [id number]  DATE OF BIRTH:  Nov 26, 1916  SUBJECTIVE:  CHIEF COMPLAINT:   Chief Complaint  Patient presents with  . Altered Mental Status   Drowzy but following commands. Was awake thru the night and sleeping now  REVIEW OF SYSTEMS:    Review of Systems  Unable to perform ROS: Mental status change    DRUG ALLERGIES:   Allergies  Allergen Reactions  . Asa Buff (Mag [Buffered Aspirin] Other (See Comments)    Nose bleeds  . Lipitor [Atorvastatin Calcium] Other (See Comments)    Headaches & dizzy  . Zocor [Simvastatin - High Dose] Other (See Comments)    myalgia    VITALS:  Blood pressure (!) 109/42, pulse (!) 59, temperature 98.6 F (37 C), temperature source Oral, resp. rate 18, height 5\' 1"  (1.549 m), weight 56.2 kg (123 lb 12.8 oz), SpO2 95 %.  PHYSICAL EXAMINATION:   Physical Exam  GENERAL:  83 y.o.-year-old patient lying in the bed with no acute distress.  EYES: Pupils equal, round, reactive to light and accommodation. No scleral icterus. Extraocular muscles intact.  HEENT: Head atraumatic, normocephalic. Oropharynx and nasopharynx clear.  NECK:  Supple, no jugular venous distention. No thyroid enlargement, no tenderness.  LUNGS: Normal breath sounds bilaterally, no wheezing, rales, rhonchi. No use of accessory muscles of respiration.  CARDIOVASCULAR: S1, S2 normal. No murmurs, rubs, or gallops.  ABDOMEN: Soft, nontender, nondistended. Bowel sounds present. No organomegaly or mass.  EXTREMITIES: No cyanosis, clubbing or edema b/l.    NEUROLOGIC: Cranial nerves II through XII are intact.  Motor 5-/5 upper and lower extremities PSYCHIATRIC: The patient is drowzy  LABORATORY PANEL:   CBC  Recent Labs Lab 02/24/16 0527  WBC 10.2  HGB 10.6*  HCT 31.0*  PLT 298    ------------------------------------------------------------------------------------------------------------------ Chemistries   Recent Labs Lab 02/23/16 1821 02/24/16 0527  NA 133* 134*  K 4.4 4.1  CL 102 105  CO2 23 22  GLUCOSE 104* 88  BUN 46* 41*  CREATININE 3.01* 2.63*  CALCIUM 8.5* 9.1  AST 22  --   ALT 14  --   ALKPHOS 76  --   BILITOT 0.5  --    ------------------------------------------------------------------------------------------------------------------  Cardiac Enzymes No results for input(s): TROPONINI in the last 168 hours. ------------------------------------------------------------------------------------------------------------------  RADIOLOGY:  Dg Chest 2 View  Result Date: 02/23/2016 CLINICAL DATA:  Altered mental status and weakness. EXAM: CHEST  2 VIEW COMPARISON:  08/15/2007 FINDINGS: Mild cardiomegaly with aortic atherosclerosis. Mild diffuse age related interstitial prominence without alveolar consolidations, pneumothorax nor effusion. Tiny nodular density is seen at the left lung apex unchanged from 2009 and likely benign finding. Vascular versus pleural calcifications at the right lung apex. Benign appearing subcortical cyst of the right humeral head superolaterally. IMPRESSION: Stable borderline cardiomegaly with aortic atherosclerosis. Stable left apical nodular density since 2009 consistent with a benign finding. No acute pulmonary disease. Electronically Signed   By: Ashley Royalty M.D.   On: 02/23/2016 20:02   Ct Head Wo Contrast  Result Date: 02/23/2016 CLINICAL DATA:  Somnolence EXAM: CT HEAD WITHOUT CONTRAST TECHNIQUE: Contiguous axial images were obtained from the base of the skull through the vertex without intravenous contrast. COMPARISON:  05/02/2014 CT FINDINGS: BRAIN: There is mild sulcal prominence consistent with superficial atrophy. No intraparenchymal hemorrhage, mass effect nor midline shift. There are chronic appearing patchy  periventricular and subcortical white matter hypodensities consistent  with mild small vessel ischemic disease. The ventricles are not significantly dilated. No acute large vascular territory infarcts. No abnormal extra-axial fluid collections. Basal cisterns are not effaced. Right-sided tentorial calcification. VASCULAR: Moderate calcific atherosclerosis of the carotid siphons. SKULL: No skull fracture. No significant scalp soft tissue swelling. SINUSES/ORBITS: The mastoid air-cells are clear. The included paranasal sinuses are well-aerated.The included ocular globes and orbital contents are non-suspicious. Bilateral lens replacements surgeries. OTHER: Mild asymmetric soft tissue swelling of the right eyelid relative to left. IMPRESSION: Chronic stable superficial atrophy with chronic small vessel ischemic disease of periventricular and subcortical white matter. No acute intracranial abnormality. Soft tissue swelling of the right eyelid. Electronically Signed   By: Ashley Royalty M.D.   On: 02/23/2016 21:58   Mr Brain Wo Contrast  Result Date: 02/24/2016 CLINICAL DATA:  Persistent lethargy.  Confusion. EXAM: MRI HEAD WITHOUT CONTRAST TECHNIQUE: Multiplanar, multiecho pulse sequences of the brain and surrounding structures were obtained without intravenous contrast. COMPARISON:  CT head 02/23/2016.  No prior MRI is available. FINDINGS: Brain: Tiny (2-3 mm) focus of restricted diffusion, RIGHT posterior frontal cortex (image 43 series 100, image 22 series 101), represents a small acute infarct. Similar sized 2-3 mm focus, RIGHT lentiform nucleus superiorly, seen only on coronal DWI, indeterminate, but favored to represent a second acute infarct. Suspected acute infarct RIGHT midbrain, 2-3 mm, adjacent to the red nucleus, see image 23 series 100, image 25 series 101. An infarct in this area could result in altered sensorium. Low level restricted diffusion RIGHT centrum semiovale (image 35 series 100, image 22 series  101, also subcentimeter size, could represent a subacute ischemic deep white matter insult. No hemorrhage, mass lesion, hydrocephalus, or extra-axial fluid. Moderate atrophy. Extensive white matter disease, likely chronic microvascular ischemic change. Prominent perivascular spaces likely sequelae of longstanding hypertension, without areas of microbleeds/hemosiderin deposition Vascular: Flow voids are maintained throughout the carotid, basilar, and vertebral arteries. Skull and upper cervical spine: Normal marrow signal. Sinuses/Orbits: Negative.  BILATERAL cataract extraction. Other: Trace RIGHT mastoid effusion. IMPRESSION: Small acute nonhemorrhagic cortical infarct RIGHT posterior frontal cortex. Suspected acute infarct RIGHT paramedian midbrain. This could correlate with altered sensorium. Probable small acute nonhemorrhagic infarct, RIGHT lentiform nucleus, seen only on coronal DWI. Possible small subacute deep white matter infarct, RIGHT centrum semiovale. Moderate atrophy with extensive small vessel disease. Electronically Signed   By: Staci Righter M.D.   On: 02/24/2016 10:43     ASSESSMENT AND PLAN:   * Fever Etiology unclear UA- Normal CXR- clear. Bcx - NGTD Some cough. Will check Rapid Flu A and B. isolation  * Acute right multiple CVA, embolic -Check MRI of the brain, Carotid dopplers, Echo - Started plavix. Allergy to statin - Lovenox for DVT prophylaxis. - PT/OT/Speech consult as needed per symptoms - Neuro checks every 4 hours for 24 hours. - Consult neurology. Appreciate help  * Hypertension Continue Norvasc. Stop lisinopril and metoprolol.  * Insomnia Start QHS seroquel.  Discussed with daughter need for SNF vs HH  Likely d/c in AM if improved  All the records are reviewed and case discussed with Care Management/Social Workerr. Management plans discussed with the patient, family and they are in agreement.  CODE STATUS: DNR  TOTAL TIME TAKING CARE OF THIS  PATIENT: 35 minutes.   Hillary Bow R M.D on 02/25/2016 at 3:34 PM  Between 7am to 6pm - Pager - [phone]  After 6pm go to [email or web address] - password [email or web address] [facility]  Office  [phone]  CC: Primary care physician; Crecencio Mc, MD  Note: This dictation was prepared with Dragon dictation along with smaller phrase technology. Any transcriptional errors that result from this process are unintentional.

## 2016-02-26 ENCOUNTER — Inpatient Hospital Stay: Admit: 2016-02-26 | Payer: Medicare Other

## 2016-02-26 ENCOUNTER — Telehealth: Payer: Self-pay | Admitting: Internal Medicine

## 2016-02-26 ENCOUNTER — Inpatient Hospital Stay: Payer: Medicare Other

## 2016-02-26 LAB — LIPID PANEL
CHOL/HDL RATIO: 4.3 ratio
CHOLESTEROL: 169 mg/dL (ref 0–200)
HDL: 39 mg/dL — ABNORMAL LOW (ref >40)
LDL CALC: 104 mg/dL — AB (ref 0–99)
Triglycerides: 132 mg/dL (ref <150)
VLDL: 26 mg/dL (ref 0–40)

## 2016-02-26 LAB — RAPID INFLUENZA A&B ANTIGENS
Influenza A (ARMC): POSITIVE — AB
Influenza B (ARMC): NEGATIVE

## 2016-02-26 MED ORDER — PREDNISONE 20 MG PO TABS
40.0000 mg | ORAL_TABLET | Freq: Every day | ORAL | Status: DC
  Administered 2016-02-26: 12:00:00 40 mg via ORAL
  Filled 2016-02-26: qty 2

## 2016-02-26 MED ORDER — ENSURE ENLIVE PO LIQD
237.0000 mL | Freq: Two times a day (BID) | ORAL | Status: DC
  Administered 2016-02-26: 14:00:00 237 mL via ORAL

## 2016-02-26 MED ORDER — CLOPIDOGREL BISULFATE 75 MG PO TABS: 75.0000 mg | ORAL_TABLET | Freq: Every day | ORAL | 0 refills | Status: DC

## 2016-02-26 MED ORDER — PREDNISONE 50 MG PO TABS: 50.0000 mg | ORAL_TABLET | Freq: Every day | ORAL | 0 refills | Status: DC

## 2016-02-26 MED ORDER — OSELTAMIVIR PHOSPHATE 30 MG PO CAPS: 30.0000 mg | ORAL_CAPSULE | Freq: Every day | ORAL | 0 refills | Status: DC

## 2016-02-26 MED ORDER — OSELTAMIVIR PHOSPHATE 30 MG PO CAPS
30.0000 mg | ORAL_CAPSULE | Freq: Every day | ORAL | Status: DC
  Administered 2016-02-26: 04:00:00 30 mg via ORAL
  Filled 2016-02-26: qty 1

## 2016-02-26 MED ORDER — QUETIAPINE FUMARATE 25 MG PO TABS: 12.5000 mg | ORAL_TABLET | Freq: Every day | ORAL | 0 refills | Status: DC

## 2016-02-26 NOTE — Progress Notes (Signed)
Pharmacist - Prescriber Communication  Tamiflu dose has been changed to 30 mg po daily for CrCl approximately 10 mL/min. Tamiflu has not been studied in patients with CrCl less than 10 mL/min and use is not recommended for patients with ESRD not on HD.   Korissa Horsford A. [place], [place].D., BCPS Clinical Pharmacist 02/26/2016 [phone]

## 2016-02-26 NOTE — Progress Notes (Addendum)
Said nurse came in at 11:45pm and took over care.  Report taken.  Pt pulled IV access out and is refusing to have another one placed.  Refused evening PO meds. Paged MD concerning IV.  Ok as long as it's documented.

## 2016-02-26 NOTE — Discharge Instructions (Signed)
Diet and activity as before.  Home health physical therapy

## 2016-02-26 NOTE — Progress Notes (Signed)
Influenza A positive- Tamiflu started.

## 2016-02-26 NOTE — Progress Notes (Signed)
Subjective: Patient more alert today.  OOB in chair.    Objective: Current vital signs: BP 120/62 (BP Location: Left Arm)   Pulse 91   Temp 98.7 F (37.1 C) (Axillary)   Resp 18   Ht 5\' 1"  (1.549 m)   Wt 56.2 kg (123 lb 12.8 oz)   SpO2 96%   BMI 23.39 kg/m  Vital signs in last 24 hours: Temp:  [98.6 F (37 C)-98.8 F (37.1 C)] 98.7 F (37.1 C) (01/04 1239) Pulse Rate:  [59-95] 91 (01/04 1239) Resp:  [18-20] 18 (01/04 1239) BP: (109-152)/(42-62) 120/62 (01/04 1239) SpO2:  [92 %-96 %] 96 % (01/04 1239)  Intake/Output from previous day: 01/03 0701 - 01/04 0700 In: -  Out: 100 [Urine:100] Intake/Output this shift: No intake/output data recorded. Nutritional status: DIET DYS 3 Room service appropriate? Yes with Assist; Fluid consistency: Thin  Neurologic Exam: Mental Status: Alert but when I attempt to interact with her she rolls on her side into a ball and will not communicate.  Does not follow commands.  Speaks only to tell me to leave but does so fluently.   Cranial Nerves: II: Discs flat bilaterally; Blinks to bilateral confrontation.  Pupils equal, round, reactive to light and accommodation III,IV, VI: ptosis not present, extra-ocular motions grossly intact bilaterally V,VII: ? Right facial droop, facial light touch sensation normal bilaterally VIII: hearing normal bilaterally IX,X: gag reflex unable to be tested XI: bilateral shoulder shrug XII: tongue extension unable to be tested Motor: Lifts all extremities against gravity Sensory: Responds to light noxious stimuli throughout   Lab Results: Basic Metabolic Panel:  Recent Labs Lab 02/23/16 1821 02/24/16 0527  NA 133* 134*  K 4.4 4.1  CL 102 105  CO2 23 22  GLUCOSE 104* 88  BUN 46* 41*  CREATININE 3.01* 2.63*  CALCIUM 8.5* 9.1    Liver Function Tests:  Recent Labs Lab 02/23/16 1821  AST 22  ALT 14  ALKPHOS 76  BILITOT 0.5  PROT 6.6  ALBUMIN 3.7   No results for input(s): LIPASE, AMYLASE  in the last 168 hours. No results for input(s): AMMONIA in the last 168 hours.  CBC:  Recent Labs Lab 02/23/16 1821 02/24/16 0527  WBC 9.9 10.2  HGB 10.3* 10.6*  HCT 29.8* 31.0*  MCV 85.8 86.1  PLT 294 298    Cardiac Enzymes: No results for input(s): CKTOTAL, CKMB, CKMBINDEX, TROPONINI in the last 168 hours.  Lipid Panel:  Recent Labs Lab 02/26/16 0456  CHOL 169  TRIG 132  HDL 39*  CHOLHDL 4.3  VLDL 26  LDLCALC 104*    CBG:  Recent Labs Lab 02/23/16 1820  GLUCAP 101*    Microbiology: Results for orders placed or performed during the hospital encounter of 02/23/16  MRSA PCR Screening     Status: None   Collection Time: 02/24/16  3:40 AM  Result Value Ref Range Status   MRSA by PCR NEGATIVE NEGATIVE Final    Comment:        The GeneXpert MRSA Assay (FDA approved for NASAL specimens only), is one component of a comprehensive MRSA colonization surveillance program. It is not intended to diagnose MRSA infection nor to guide or monitor treatment for MRSA infections.   CULTURE, BLOOD (ROUTINE X 2) w Reflex to ID Panel     Status: None (Preliminary result)   Collection Time: 02/24/16 10:25 AM  Result Value Ref Range Status   Specimen Description BLOOD L HAND  Final   Special Requests  Final    BOTTLES DRAWN AEROBIC AND ANAEROBIC AER 6ML ANA 5ML   Culture NO GROWTH 2 DAYS  Final   Report Status PENDING  Incomplete  CULTURE, BLOOD (ROUTINE X 2) w Reflex to ID Panel     Status: None (Preliminary result)   Collection Time: 02/24/16 10:34 AM  Result Value Ref Range Status   Specimen Description BLOOD R HAND  Final   Special Requests   Final    BOTTLES DRAWN AEROBIC AND ANAEROBIC AER 7ML ANA 6ML   Culture NO GROWTH 2 DAYS  Final   Report Status PENDING  Incomplete  Rapid Influenza A&B Antigens ([facility] only)     Status: Abnormal   Collection Time: 02/26/16  1:04 AM  Result Value Ref Range Status   Influenza A ([facility]) POSITIVE (A) NEGATIVE Final   Influenza  B ([facility]) NEGATIVE NEGATIVE Final    Coagulation Studies: No results for input(s): LABPROT, INR in the last 72 hours.  Imaging: US Carotid Bilateral  Result Date: 02/25/2016 CLINICAL DATA:  CVA. EXAM: BILATERAL CAROTID DUPLEX ULTRASOUND TECHNIQUE: Pearline Cables scale imaging, color Doppler and duplex ultrasound were performed of bilateral carotid and vertebral arteries in the neck. COMPARISON:  MRI 02/24/2015. FINDINGS: Criteria: Quantification of carotid stenosis is based on velocity parameters that correlate the residual internal carotid diameter with NASCET-based stenosis levels, using the diameter of the distal internal carotid lumen as the denominator for stenosis measurement. The following velocity measurements were obtained: RIGHT ICA:  68/12 cm/sec CCA:  0000000 cm/sec SYSTOLIC ICA/CCA RATIO:  0.9 DIASTOLIC ICA/CCA RATIO:  1.6 ECA:  240 cm/sec LEFT ICA:  82/13 cm/sec CCA:  99991111 cm/sec SYSTOLIC ICA/CCA RATIO:  1.2 DIASTOLIC ICA/CCA RATIO:  2.0 ECA:  155 cm/sec RIGHT CAROTID ARTERY: Mild right common carotid and carotid bifurcation atherosclerotic vascular disease. RIGHT VERTEBRAL ARTERY:  Patent with antegrade flow. LEFT CAROTID ARTERY: Mild left common carotid, carotid bifurcation, and ICA atherosclerotic vascular disease. LEFT VERTEBRAL ARTERY:  Patent with antegrade flow. IMPRESSION: 1. Mild bilateral carotid atherosclerotic vascular disease. No flow limiting stenosis. Degree of stenosis less than 50% bilaterally. 2. Vertebral arteries are patent antegrade flow. Electronically Signed   By: Marcello Moores  Register   On: 02/25/2016 16:48    Medications:  I have reviewed the patient's current medications. Scheduled: . amLODipine  5 mg Oral Daily  . clopidogrel  75 mg Oral Daily  . heparin  5,000 Units Subcutaneous Q8H  . oseltamivir  30 mg Oral Daily  . PARoxetine  10 mg Oral Daily  . predniSONE  40 mg Oral Q breakfast  . QUEtiapine  25 mg Oral QHS    Assessment/Plan: Patient more alert today.  Due to  fluctuating mental status EEG ordered but patient refuses.  Will discontinue.  On Plavix.  LDL 104.  Carotid dopplers show no evidence of hemodynamically significant stenosis.  Echocardiogram pending.   Recommendations: 1.  Continue Plavix 2.  Statin for lipid management with target LDL<70. 3.  Bagnall consider LINQ/holter monitoring on an outpatient basis if echocardiogram unremarkable.      LOS: 1 day   Alexis Goodell, MD Neurology [phone] 02/26/2016  12:52 PM

## 2016-02-26 NOTE — Care Management (Signed)
Discharge to [facility] today per Dr. Darvin Neighbours.  [facility] for nursing services. Text message to [place] Mall, [facility] representative. Shelbie Ammons RN MSN CCM Care management

## 2016-02-26 NOTE — Telephone Encounter (Signed)
Ginger from [facility] called and needs a HFU for 1 week to 10 days. Acute ensynphiolsity. Please advise, thank you!  Call pt @ [phone]

## 2016-02-26 NOTE — NC FL2 (Signed)
Central LEVEL OF CARE SCREENING TOOL     IDENTIFICATION  Patient Name: Cindy Harvey Birthdate: Jul 29, 1916 Sex: female Admission Date (Current Location): 02/23/2016  [place] and [place] Number:  Engineering geologist and Address:  [facility], [address], [place], [place] [address]      Provider Number: [id number]  Attending Physician Name and Address:  Hillary Bow, MD  Relative Name and Phone Number:  Conni Elliot Daughter [phone]  [phone]     Current Level of Care: Hospital Recommended Level of Care: [facility]) Prior Approval Number:    Date Approved/Denied:   PASRR Number:    Discharge Plan: Domiciliary (Rest home) [facility])    Current Diagnoses: Patient Active Problem List   Diagnosis Date Noted  . CVA (cerebral vascular accident) ([place]) 02/25/2016  . Acute encephalopathy 02/23/2016  . Acute on chronic renal failure ([place]) 02/23/2016  . Medicare annual wellness visit, subsequent 08/23/2015  . 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 Generalized anxiety disorder 12/04/2013  . History of fall 09/28/2013  . DNR (do not resuscitate) discussion 09/26/2013  . Hyponatremia 09/26/2013  . Chronic renal insufficiency, stage III (moderate) 01/31/2013  . Heart murmur 07/21/2011  . Post-menopausal 07/14/2010  . Osteoporosis screening 07/14/2010  . Hordeolum 07/07/2010  . Depression (emotion) 05/21/2010  . HLD (hyperlipidemia) 10/24/2008  . Essential hypertension 10/24/2008  . ALLERGIC RHINITIS 10/24/2008  . Osteoarthrosis involving lower leg 10/24/2008    Orientation RESPIRATION BLADDER Height & Weight     Self  Normal Incontinent Weight: 123 lb 12.8 oz (56.2 kg) Height:  5\' 1"  (154.9 cm)  BEHAVIORAL SYMPTOMS/MOOD NEUROLOGICAL BOWEL NUTRITION STATUS      Continent Diet  AMBULATORY STATUS COMMUNICATION OF NEEDS Skin   Supervision Verbally Normal                      Personal Care Assistance Level of Assistance  Bathing, Feeding, Dressing Bathing Assistance: Limited assistance Feeding assistance: Independent Dressing Assistance: Limited assistance     Functional Limitations Info  Sight, Hearing, Speech Sight Info: Adequate Hearing Info: Impaired Speech Info: Adequate    SPECIAL CARE FACTORS FREQUENCY  PT (By licensed PT)     PT Frequency: Home Health PT Minimum 2x a week              Contractures Contractures Info: Not present    Additional Factors Info  Allergies, Code Status, Psychotropic Code Status Info: DNR Allergies Info: ASA BUFF (MAG BUFFERED ASPIRIN, LIPITOR ATORVASTATIN CALCIUM, ZOCOR SIMVASTATIN - HIGH DOSE  Psychotropic Info: QUEtiapine (SEROQUEL) tablet 25 mg         Current Medications (02/26/2016):  This is the current hospital active medication list Current Facility-Administered Medications  Medication Dose Route Frequency Provider Last Rate Last Dose  . acetaminophen (TYLENOL) tablet 650 mg  650 mg Oral Q6H PRN Hillary Bow, MD   650 mg at 02/25/16 0509  . amLODipine (NORVASC) tablet 5 mg  5 mg Oral Daily Lance Coon, MD   5 mg at 02/26/16 1226  . clopidogrel (PLAVIX) tablet 75 mg  75 mg Oral Daily Alexis Goodell, MD   75 mg at 02/26/16 1226  . feeding supplement (ENSURE ENLIVE) (ENSURE ENLIVE) liquid 237 mL  237 mL Oral BID BM Srikar Sudini, MD      . heparin injection 5,000 Units  5,000 Units Subcutaneous Q8H Lance Coon, MD   5,000 Units at 02/26/16 0507  . oseltamivir (  TAMIFLU) capsule 30 mg  30 mg Oral Daily Vaughan Basta, MD   30 mg at 02/26/16 0352  . PARoxetine (PAXIL) tablet 10 mg  10 mg Oral Daily Lance Coon, MD   10 mg at 02/26/16 1226  . predniSONE (DELTASONE) tablet 40 mg  40 mg Oral Q breakfast Hillary Bow, MD   40 mg at 02/26/16 1226  . QUEtiapine (SEROQUEL) tablet 25 mg  25 mg Oral QHS Hillary Bow, MD         Discharge Medications: Please see discharge summary for a list of  discharge medications.  Relevant Imaging Results:  Relevant Lab Results:   Additional Information SSN [id number]  Ross Ludwig, [place]

## 2016-02-26 NOTE — Clinical Social Work Note (Signed)
Patient to be d/c'ed today to [facility].  Patient and family agreeable to plans will transport via daughter's car RN no need to call report.  Evette Cristal, MSW, LCSWA Mon-Fri 8a-4:30p [phone]

## 2016-02-26 NOTE — Progress Notes (Signed)
Initial Nutrition Assessment  DOCUMENTATION CODES:   Not applicable  INTERVENTION:  Encourage intake of meals/beverages.  Will order Ensure Enlive po BID, each supplement provides 350 kcal and 20 grams of protein to assist in meeting calorie/protein needs with acute poor PO intake.  NUTRITION DIAGNOSIS:   Inadequate oral intake related to lethargy/confusion as evidenced by meal completion < 25%, other (see comment) (per RN report).  GOAL:   Patient will meet greater than or equal to 90% of their needs  MONITOR:   PO intake, Supplement acceptance, Labs, Weight trends, I & O's  REASON FOR ASSESSMENT:   Low Braden    ASSESSMENT:   83 year old female with PMHx of HTN, hypercholesterolemia, dementia who presents with persistent confusion on day of admission. Found to have fever in setting of influenza, acute right multiple CVA (embolic).   -Palliative Medicine has been consulted to discuss goals of care as patient lethargic, new CVA.  Attempted to speak with patient at bedside but she was very confused. No family present. Spoke with RN who reports patient has been refusing all food/beverages. Per chart patient has also been refusing PO medications. Also per chart patient [facility] discharge today.   Unable to obtain weight history from patient/family, but patient appears to be weight stable per chart.   Meal Completion: 0% per chart  Medications reviewed and include: prednisone 40 mg daily.   Labs reviewed: HDL 39, LDL 104.  Unable to complete Nutrition-Focused physical exam at this time as patient very confused.   Diet Order:  DIET DYS 3 Room service appropriate? Yes with Assist; Fluid consistency: Thin  Skin:  Reviewed, no issues  Last BM:  02/25/2016  Height:   Ht Readings from Last 1 Encounters:  02/24/16 5\' 1"  (1.549 m)    Weight:   Wt Readings from Last 1 Encounters:  02/24/16 123 lb 12.8 oz (56.2 kg)    Ideal Body Weight:  47.7 kg  BMI:  Body mass index is  23.39 kg/m.  Estimated Nutritional Needs:   Kcal:  1200-1400 (MSJ x 1.2-1.4)  Protein:  70-80 grams (1.2-1.4 grams/kg)  Fluid:  >/= 1.4 L/day (25 ml/kg)  EDUCATION NEEDS:   No education needs identified at this time  Willey Blade, MS, RD, LDN Pager: [phone] After Hours Pager: [phone]

## 2016-02-26 NOTE — Discharge Summary (Signed)
Cindy Harvey NAME: Cindy Harvey    MR#:  [id number]  DATE OF BIRTH:  [birth year]  DATE OF ADMISSION:  02/23/2016 ADMITTING PHYSICIAN: Lance Coon, MD  DATE OF DISCHARGE: 02/26/2015  PRIMARY CARE PHYSICIAN: Crecencio Mc, MD   ADMISSION DIAGNOSIS:  Altered mental status, unspecified altered mental status type [R41.82]  DISCHARGE DIAGNOSIS:  Principal Problem:   Acute encephalopathy Active Problems:   HLD (hyperlipidemia)   Essential hypertension   Generalized anxiety disorder   Acute on chronic renal failure (HCC)   CVA (cerebral vascular accident) ([place])   SECONDARY DIAGNOSIS:   Past Medical History:  Diagnosis Date  . Allergy    allergic rhinitis  . Hearing loss   . History of shingles   . Hypercholesterolemia   . Hypertension   . Macular degeneration   . Osteoarthritis    right knee  . Pneumonia 2009   with dehydration and electrolyte imbalance  . Urinary incontinence      ADMITTING HISTORY  HISTORY OF PRESENT ILLNESS:  Cindy Harvey a 83 y.o.femalewho presents with Persistent confusion throughout the day today. Patient's family states the patient was very lethargic, she slept all day missing several meals which is very characteristic for her. Initially hospitalists were called with some concern for possible stroke as the rest of the patient's workup in the ED was largely negative. On evaluation the patient herself is alert and oriented, with no focal deficits on exam. She states that she does not wish to be admitted to the hospital. Patient has significant family history for stroke and the discussion was had with her about the importance of ruling out stroke. She is oriented, but does get confused during parts of the conversation. Patient states she was not sleeping well recently and that is why she slept throughout the day today, but family states that she was intermittently confused and very  lethargic.   HOSPITAL COURSE:   * Influenza A Tamiflu for 5 days UA- Normal CXR- clear. Bcx - NGTD Started prednisone due to mild wheezing  * Acute right multiple CVA, embolic - MRI showed findings. - Started plavix. Allergy to statin - Lovenox for DVT prophylaxis in hospital - PT saw the patient. Initially SNF was considered but patient now atbaseline [facility] PT Patient refused meds and tests in hospital. EEG/ECHO not done.  Treat with Plavix. Patient would not be candidate for anti-coagulation even if needed due to advanced age and falls  * Hypertension Continue Norvasc and lisinopril  * Insomnia Start QHS seroquel.  Discharge to [facility]   CONSULTS OBTAINED:  Treatment Team:  Catarina Hartshorn, MD Alexis Goodell, MD  DRUG ALLERGIES:   Allergies  Allergen Reactions  . Asa Buff (Mag [Buffered Aspirin] Other (See Comments)    Nose bleeds  . Lipitor [Atorvastatin Calcium] Other (See Comments)    Headaches & dizzy  . Zocor [Simvastatin - High Dose] Other (See Comments)    myalgia    DISCHARGE MEDICATIONS:   Current Discharge Medication List    START taking these medications   Details  clopidogrel (PLAVIX) 75 MG tablet Take 1 tablet (75 mg total) by mouth daily. Qty: 30 tablet, Refills: 0    oseltamivir (TAMIFLU) 30 MG capsule Take 1 capsule (30 mg total) by mouth daily. Qty: 4 capsule, Refills: 0    predniSONE (DELTASONE) 50 MG tablet Take 1 tablet (50 mg total) by mouth daily with  breakfast. Qty: 4 tablet, Refills: 0      CONTINUE these medications which have NOT CHANGED   Details  acetaminophen (TYLENOL) 160 MG/5ML liquid Take 325 mg by mouth daily as needed for fever (knee pain or headache).    amLODipine (NORVASC) 5 MG tablet TAKE 1 TABLET (5 MG TOTAL) BY MOUTH DAILY. Qty: 30 tablet, Refills: 11    ezetimibe (ZETIA) 10 MG tablet Take 1 tablet (10 mg total) by mouth daily. Qty: 30 tablet, Refills: 11     meloxicam (MOBIC) 7.5 MG tablet TAKE 1 TABLET (7.5 MG TOTAL) BY MOUTH DAILY. Qty: 30 tablet, Refills: 5    Multiple Vitamins-Minerals (OCUVITE PO) Take 1 tablet by mouth daily.     niacin (NIASPAN) 500 MG CR tablet Take 2 tablets (1,000 mg total) by mouth at bedtime. Qty: 60 tablet, Refills: 12    PARoxetine (PAXIL) 10 MG tablet Take 10 mg by mouth daily.    QUEtiapine (SEROQUEL) 25 MG tablet Take 0.5 tablets (12.5 mg total) by mouth at bedtime. Qty: 45 tablet, Refills: 0    Calcium Carbonate-Vitamin D (CALCIUM 600+D) 600-200 MG-UNIT TABS Take 1 tablet by mouth daily.     fexofenadine (ALLEGRA) 60 MG tablet Take 60 mg by mouth daily.    GuaiFENesin (MUCINEX CHILDRENS PO) Take 1 Dose by mouth daily as needed (cough).     lisinopril (PRINIVIL,ZESTRIL) 20 MG tablet Take 10 mg by mouth daily.    multivitamin (THERAGRAN) per tablet Take 1 tablet by mouth daily.      Nutritional Supplements (ENSURE PO) Take by mouth at bedtime.     Omega-3 Fatty Acids (FISH OIL) 1200 MG CAPS Take 1 tablet by mouth daily.    promethazine (PHENERGAN) 25 MG tablet Take 25 mg by mouth as needed. For nausea          Today   VITAL SIGNS:  Blood pressure 120/62, pulse 91, temperature 98.7 F (37.1 C), temperature source Axillary, resp. rate 18, height 5\' 1"  (1.549 m), weight 56.2 kg (123 lb 12.8 oz), SpO2 96 %.  I/O:   Intake/Output Summary (Last 24 hours) at 02/26/16 1326 Last data filed at 02/26/16 0134  Gross per 24 hour  Intake                0 ml  Output              100 ml  Net             -100 ml    PHYSICAL EXAMINATION:  Physical Exam  GENERAL:  83 y.o.-year-old patient lying in the bed with no acute distress.  LUNGS: Mild wheezing CARDIOVASCULAR: S1, S2 normal. No murmurs, rubs, or gallops.   NEUROLOGIC: Moves all 4 extremities.Motor 5/5 upper and lower extremities PSYCHIATRIC: The patient is awake. SKIN: No obvious rash, lesion, or ulcer.   DATA REVIEW:   CBC  Recent  Labs Lab 02/24/16 0527  WBC 10.2  HGB 10.6*  HCT 31.0*  PLT 298    Chemistries   Recent Labs Lab 02/23/16 1821 02/24/16 0527  NA 133* 134*  K 4.4 4.1  CL 102 105  CO2 23 22  GLUCOSE 104* 88  BUN 46* 41*  CREATININE 3.01* 2.63*  CALCIUM 8.5* 9.1  AST 22  --   ALT 14  --   ALKPHOS 76  --   BILITOT 0.5  --     Cardiac Enzymes No results for input(s): TROPONINI in the last 168 hours.  Microbiology  Results  Results for orders placed or performed during the hospital encounter of 02/23/16  MRSA PCR Screening     Status: None   Collection Time: 02/24/16  3:40 AM  Result Value Ref Range Status   MRSA by PCR NEGATIVE NEGATIVE Final    Comment:        The GeneXpert MRSA Assay (FDA approved for NASAL specimens only), is one component of a comprehensive MRSA colonization surveillance program. It is not intended to diagnose MRSA infection nor to guide or monitor treatment for MRSA infections.   CULTURE, BLOOD (ROUTINE X 2) w Reflex to ID Panel     Status: None (Preliminary result)   Collection Time: 02/24/16 10:25 AM  Result Value Ref Range Status   Specimen Description BLOOD L HAND  Final   Special Requests   Final    BOTTLES DRAWN AEROBIC AND ANAEROBIC AER 6ML ANA 5ML   Culture NO GROWTH 2 DAYS  Final   Report Status PENDING  Incomplete  CULTURE, BLOOD (ROUTINE X 2) w Reflex to ID Panel     Status: None (Preliminary result)   Collection Time: 02/24/16 10:34 AM  Result Value Ref Range Status   Specimen Description BLOOD R HAND  Final   Special Requests   Final    BOTTLES DRAWN AEROBIC AND ANAEROBIC AER 7ML ANA 6ML   Culture NO GROWTH 2 DAYS  Final   Report Status PENDING  Incomplete  Rapid Influenza A&B Antigens ([facility] only)     Status: Abnormal   Collection Time: 02/26/16  1:04 AM  Result Value Ref Range Status   Influenza A ([facility]) POSITIVE (A) NEGATIVE Final   Influenza B ([facility]) NEGATIVE NEGATIVE Final    RADIOLOGY:  US Carotid Bilateral  Result Date:  02/25/2016 CLINICAL DATA:  CVA. EXAM: BILATERAL CAROTID DUPLEX ULTRASOUND TECHNIQUE: Pearline Cables scale imaging, color Doppler and duplex ultrasound were performed of bilateral carotid and vertebral arteries in the neck. COMPARISON:  MRI 02/24/2015. FINDINGS: Criteria: Quantification of carotid stenosis is based on velocity parameters that correlate the residual internal carotid diameter with NASCET-based stenosis levels, using the diameter of the distal internal carotid lumen as the denominator for stenosis measurement. The following velocity measurements were obtained: RIGHT ICA:  68/12 cm/sec CCA:  0000000 cm/sec SYSTOLIC ICA/CCA RATIO:  0.9 DIASTOLIC ICA/CCA RATIO:  1.6 ECA:  240 cm/sec LEFT ICA:  82/13 cm/sec CCA:  99991111 cm/sec SYSTOLIC ICA/CCA RATIO:  1.2 DIASTOLIC ICA/CCA RATIO:  2.0 ECA:  155 cm/sec RIGHT CAROTID ARTERY: Mild right common carotid and carotid bifurcation atherosclerotic vascular disease. RIGHT VERTEBRAL ARTERY:  Patent with antegrade flow. LEFT CAROTID ARTERY: Mild left common carotid, carotid bifurcation, and ICA atherosclerotic vascular disease. LEFT VERTEBRAL ARTERY:  Patent with antegrade flow. IMPRESSION: 1. Mild bilateral carotid atherosclerotic vascular disease. No flow limiting stenosis. Degree of stenosis less than 50% bilaterally. 2. Vertebral arteries are patent antegrade flow. Electronically Signed   By: Marcello Moores  Register   On: 02/25/2016 16:48    Follow up with PCP in 1 week.  Management plans discussed with the patient, family and they are in agreement.  CODE STATUS:     Code Status Orders        Start     Ordered   02/24/16 0158  Do not attempt resuscitation (DNR)  Continuous    Question Answer Comment  In the event of cardiac or respiratory ARREST Do not call a "code blue"   In the event of cardiac or respiratory ARREST Do not perform  Intubation, CPR, defibrillation or ACLS   In the event of cardiac or respiratory ARREST Use medication by any route, position, wound care,  and other measures to relive pain and suffering. [place] use oxygen, suction and manual treatment of airway obstruction as needed for comfort.      02/24/16 0200    Code Status History    Date Active Date Inactive Code Status Order ID Comments User Context   This patient has a current code status but no historical code status.      TOTAL TIME TAKING CARE OF THIS PATIENT ON DAY OF DISCHARGE: more than 30 minutes.   Hillary Bow R M.D on 02/26/2016 at 1:26 PM  Between 7am to 6pm - Pager - [phone]  After 6pm go to [email or web address] - password [email or web address] [facility]  Office  [phone]  CC: Primary care physician; Crecencio Mc, MD  Note: This dictation was prepared with Dragon dictation along with smaller phrase technology. Any transcriptional errors that result from this process are unintentional.

## 2016-02-27 ENCOUNTER — Telehealth: Payer: Self-pay | Admitting: Internal Medicine

## 2016-02-27 DIAGNOSIS — I129 Hypertensive chronic kidney disease with stage 1 through stage 4 chronic kidney disease, or unspecified chronic kidney disease: Secondary | ICD-10-CM | POA: Diagnosis not present

## 2016-02-27 DIAGNOSIS — Z8673 Personal history of transient ischemic attack (TIA), and cerebral infarction without residual deficits: Secondary | ICD-10-CM | POA: Diagnosis not present

## 2016-02-27 DIAGNOSIS — H3530 Unspecified macular degeneration: Secondary | ICD-10-CM | POA: Diagnosis not present

## 2016-02-27 DIAGNOSIS — N189 Chronic kidney disease, unspecified: Secondary | ICD-10-CM | POA: Diagnosis not present

## 2016-02-27 DIAGNOSIS — E785 Hyperlipidemia, unspecified: Secondary | ICD-10-CM | POA: Diagnosis not present

## 2016-02-27 DIAGNOSIS — M1711 Unilateral primary osteoarthritis, right knee: Secondary | ICD-10-CM | POA: Diagnosis not present

## 2016-02-27 DIAGNOSIS — F411 Generalized anxiety disorder: Secondary | ICD-10-CM | POA: Diagnosis not present

## 2016-02-27 DIAGNOSIS — F0390 Unspecified dementia without behavioral disturbance: Secondary | ICD-10-CM | POA: Diagnosis not present

## 2016-02-27 LAB — HEMOGLOBIN A1C
Hgb A1c MFr Bld: 5.5 % (ref 4.8–5.6)
MEAN PLASMA GLUCOSE: 111 mg/dL

## 2016-02-27 NOTE — Telephone Encounter (Signed)
Mickel Baas from [facility] called requesting orders PT2x week for 6 weeks and an OT evaluation. Please advise, thank you!  Call Mickel Baas @ [phone] ([place] leave vm)

## 2016-02-27 NOTE — Telephone Encounter (Signed)
VERBAL GIVEN

## 2016-02-27 NOTE — Telephone Encounter (Signed)
Transition Care Management Follow-up Telephone Call  How have you been since you were released from the hospital? daughter stated she was better but still seeing people whom are not there. Some weakness noted in Extremities. Patient was also DX with Flu while hospitalized.   Do you understand why you were in the hospital? Patient did not understand why she was in hospital. Patient daughter is retired Marine scientist.   Do you understand the discharge instrcutions? Yes., Daughter is nurse.  Items Reviewed:  Medications reviewed: Yes, patient was started on Tamiflu, prednisone, and plavix ,   Allergies reviewed: Yes.   Dietary changes reviewed: Yes,   Referrals reviewed: Occupational Therapy was discussed, but not offered.   Functional Questionnaire:   Activities of Daily Living (ADLs):   She states they are independent in the following:   Can dress with assistance,  States they require assistance with the following:        Requires assist with ADLs except feeding    Any transportation issues/concerns?: No   Any patient concerns? Discuss medications at visit.   Confirmed importance and date/time of follow-up visits scheduled:Yes.   Confirmed with patient if condition begins to worsen call PCP or go to the ER.  Patient was given the Call-a-Nurse line [phone]: Yes.

## 2016-02-29 LAB — CULTURE, BLOOD (ROUTINE X 2)
CULTURE: NO GROWTH
Culture: NO GROWTH

## 2016-03-02 DIAGNOSIS — N189 Chronic kidney disease, unspecified: Secondary | ICD-10-CM | POA: Diagnosis not present

## 2016-03-02 DIAGNOSIS — F411 Generalized anxiety disorder: Secondary | ICD-10-CM | POA: Diagnosis not present

## 2016-03-02 DIAGNOSIS — I129 Hypertensive chronic kidney disease with stage 1 through stage 4 chronic kidney disease, or unspecified chronic kidney disease: Secondary | ICD-10-CM | POA: Diagnosis not present

## 2016-03-02 DIAGNOSIS — E785 Hyperlipidemia, unspecified: Secondary | ICD-10-CM | POA: Diagnosis not present

## 2016-03-02 DIAGNOSIS — M1711 Unilateral primary osteoarthritis, right knee: Secondary | ICD-10-CM | POA: Diagnosis not present

## 2016-03-02 DIAGNOSIS — F0390 Unspecified dementia without behavioral disturbance: Secondary | ICD-10-CM | POA: Diagnosis not present

## 2016-03-03 ENCOUNTER — Ambulatory Visit (INDEPENDENT_AMBULATORY_CARE_PROVIDER_SITE_OTHER): Payer: Medicare Other | Admitting: Internal Medicine

## 2016-03-03 ENCOUNTER — Encounter: Payer: Self-pay | Admitting: Internal Medicine

## 2016-03-03 VITALS — BP 98/56 | HR 83 | Temp 97.9°F | Resp 16 | Ht 61.0 in | Wt 124.4 lb

## 2016-03-03 DIAGNOSIS — F0391 Unspecified dementia with behavioral disturbance: Secondary | ICD-10-CM

## 2016-03-03 DIAGNOSIS — F0392 Unspecified dementia, unspecified severity, with psychotic disturbance: Secondary | ICD-10-CM

## 2016-03-03 DIAGNOSIS — I10 Essential (primary) hypertension: Secondary | ICD-10-CM

## 2016-03-03 DIAGNOSIS — Z09 Encounter for follow-up examination after completed treatment for conditions other than malignant neoplasm: Secondary | ICD-10-CM

## 2016-03-03 DIAGNOSIS — I63411 Cerebral infarction due to embolism of right middle cerebral artery: Secondary | ICD-10-CM

## 2016-03-03 DIAGNOSIS — F0281 Dementia in other diseases classified elsewhere with behavioral disturbance: Secondary | ICD-10-CM

## 2016-03-03 DIAGNOSIS — Z8673 Personal history of transient ischemic attack (TIA), and cerebral infarction without residual deficits: Secondary | ICD-10-CM | POA: Diagnosis not present

## 2016-03-03 MED ORDER — GUAIFENESIN 100 MG/5ML PO LIQD: 200.0000 mg | Freq: Three times a day (TID) | ORAL | 3 refills | Status: DC | PRN

## 2016-03-03 MED ORDER — PROMETHAZINE HCL 12.5 MG PO TABS: 12.5000 mg | ORAL_TABLET | Freq: Three times a day (TID) | ORAL | 3 refills | Status: DC | PRN

## 2016-03-03 MED ORDER — FEXOFENADINE HCL 60 MG PO TABS
60.0000 mg | ORAL_TABLET | Freq: Every day | ORAL | 11 refills | Status: DC
Start: 2016-03-03 — End: 2018-01-07

## 2016-03-03 MED ORDER — ACETAMINOPHEN 160 MG/5ML PO LIQD: 325.0000 mg | Freq: Every day | ORAL | 5 refills | Status: DC | PRN

## 2016-03-03 MED ORDER — EQ MULTIVITAMINS ADULT GUMMY PO CHEW: 1.0000 | CHEWABLE_TABLET | Freq: Two times a day (BID) | ORAL | 11 refills | Status: DC

## 2016-03-03 MED ORDER — EMULSIFIED OMEGA-3 712-1024 MG/15ML PO LIQD: 5.0000 mL | Freq: Every day | ORAL | 11 refills | Status: DC

## 2016-03-03 NOTE — Patient Instructions (Addendum)
Suspend the amlodipine until BP is consistently > 150 /90.  If BP contineus to be < 100/70,  Suspend lisinopril as well.     Suspend seroquel  .  If agitation recurs,  We will resume

## 2016-03-03 NOTE — Progress Notes (Signed)
Subjective:  Patient ID: Cindy Harvey, female    DOB: January 15, 1917  Age: 83 y.o. MRN: [id number]  CC: Diagnoses of Cerebrovascular accident (CVA) due to embolism of right middle cerebral artery ([place]), Essential hypertension, Senile dementia with psychosis, and Hospital discharge follow-up were pertinent to this visit.  HPI Cindy Harvey presents for hospital follow up. Admitted to [facility] on January 1 with AMS and diagnosed with multiple  embolic CVA of the right brain brian, confirmed by MRI.  Also diagnosed with influenza A  and treated.  CVA Workup and SNF discharge refused by patient,    Plavix,  tamiflu and prednisone taper were prescribed at discharge on Jan 4th  She is accompanied by her daughter Cindy Harvey who has noted no change from baseline prior to admission except for a slight tremor noted in left hand.  The seroquel was not started prior to admission .  She is lethargic today,  But has been very active since discharge.  Her first dose of [place]ero[place] was on dec 5 after discharge .    Outpatient Medications Prior to Visit  Medication Sig Dispense Refill  . amLODipine (NORVASC) 5 MG tablet TAKE 1 TABLET (5 MG TOTAL) BY MOUTH DAILY. 30 tablet 11  . Calcium Carbonate-Vitamin D (CALCIUM 600+D) 600-200 MG-UNIT TABS Take 1 tablet by mouth daily.     . clopidogrel (PLAVIX) 75 MG tablet Take 1 tablet (75 mg total) by mouth daily. 30 tablet 0  . ezetimibe (ZETIA) 10 MG tablet Take 1 tablet (10 mg total) by mouth daily. 30 tablet 11  . meloxicam (MOBIC) 7.5 MG tablet TAKE 1 TABLET (7.5 MG TOTAL) BY MOUTH DAILY. 30 tablet 5  . Multiple Vitamins-Minerals (OCUVITE PO) Take 1 tablet by mouth daily.     . multivitamin (THERAGRAN) per tablet Take 1 tablet by mouth daily.      . niacin (NIASPAN) 500 MG CR tablet Take 2 tablets (1,000 mg total) by mouth at bedtime. 60 tablet 12  . Nutritional Supplements (ENSURE PO) Take by mouth at bedtime.     . Omega-3 Fatty Acids (FISH OIL) 1200 MG CAPS Take 1 tablet by  mouth daily.    Marland Kitchen PARoxetine (PAXIL) 10 MG tablet Take 10 mg by mouth daily.    . QUEtiapine (SEROQUEL) 25 MG tablet Take 0.5 tablets (12.5 mg total) by mouth at bedtime. 30 tablet 0  . acetaminophen (TYLENOL) 160 MG/5ML liquid Take 325 mg by mouth daily as needed for fever (knee pain or headache).    . fexofenadine (ALLEGRA) 60 MG tablet Take 60 mg by mouth daily.    . GuaiFENesin (MUCINEX CHILDRENS PO) Take 1 Dose by mouth daily as needed (cough).     . promethazine (PHENERGAN) 25 MG tablet Take 25 mg by mouth as needed. For nausea      . lisinopril (PRINIVIL,ZESTRIL) 20 MG tablet Take 10 mg by mouth daily.    Marland Kitchen oseltamivir (TAMIFLU) 30 MG capsule Take 1 capsule (30 mg total) by mouth daily. 4 capsule 0  . predniSONE (DELTASONE) 50 MG tablet Take 1 tablet (50 mg total) by mouth daily with breakfast. 4 tablet 0   No facility-administered medications prior to visit.     Review of Systems;  Patient denies headache, fevers, malaise, unintentional weight loss, skin rash, eye pain, sinus congestion and sinus pain, sore throat, dysphagia,  hemoptysis , cough, dyspnea, wheezing, chest pain, palpitations, orthopnea, edema, abdominal pain, nausea, melena, diarrhea, constipation, flank pain, dysuria, hematuria, urinary  Frequency,  nocturia, numbness, tingling, seizures,  Focal weakness, Loss of consciousness,  Tremor, insomnia, depression, anxiety, and suicidal ideation.      Objective:  BP (!) 98/56   Pulse 83   Temp 97.9 F (36.6 C) (Oral)   Resp 16   Ht 5\' 1"  (1.549 m)   Wt 124 lb 6 oz (56.4 kg)   SpO2 94%   BMI 23.50 kg/m   BP Readings from Last 3 Encounters:  03/03/16 (!) 98/56  02/26/16 120/62  02/20/16 (!) 142/60    Wt Readings from Last 3 Encounters:  03/03/16 124 lb 6 oz (56.4 kg)  02/24/16 123 lb 12.8 oz (56.2 kg)  02/20/16 124 lb 3.2 oz (56.3 kg)    General appearance: alert, cooperative and appears stated age Ears: normal TM's and external ear canals both  ears Throat: lips, mucosa, and tongue normal; teeth and gums normal Neck: no adenopathy, no carotid bruit, supple, symmetrical, trachea midline and thyroid not enlarged, symmetric, no tenderness/mass/nodules Back: symmetric, no curvature. ROM normal. No CVA tenderness. Lungs: clear to auscultation bilaterally Heart: regular rate and rhythm, S1, S2 normal, no murmur, click, rub or gallop Abdomen: soft, non-tender; bowel sounds normal; no masses,  no organomegaly Pulses: 2+ and symmetric Skin: Skin color, texture, turgor normal. No rashes or lesions Lymph nodes: Cervical, supraclavicular, and axillary nodes normal.  Lab Results  Component Value Date   HGBA1C 5.5 02/26/2016    Lab Results  Component Value Date   CREATININE 2.63 (H) 02/24/2016   CREATININE 3.01 (H) 02/23/2016   CREATININE 2.48 (H) 08/21/2015    Lab Results  Component Value Date   WBC 10.2 02/24/2016   HGB 10.6 (L) 02/24/2016   HCT 31.0 (L) 02/24/2016   PLT 298 02/24/2016   GLUCOSE 88 02/24/2016   CHOL 169 02/26/2016   TRIG 132 02/26/2016   HDL 39 (L) 02/26/2016   LDLDIRECT 164.7 01/31/2013   LDLCALC 104 (H) 02/26/2016   ALT 14 02/23/2016   AST 22 02/23/2016   NA 134 (L) 02/24/2016   K 4.1 02/24/2016   CL 105 02/24/2016   CREATININE 2.63 (H) 02/24/2016   BUN 41 (H) 02/24/2016   CO2 22 02/24/2016   TSH 0.52 09/26/2013   HGBA1C 5.5 02/26/2016    Dg Chest 2 View  Result Date: 02/23/2016 CLINICAL DATA:  Altered mental status and weakness. EXAM: CHEST  2 VIEW COMPARISON:  08/15/2007 FINDINGS: Mild cardiomegaly with aortic atherosclerosis. Mild diffuse age related interstitial prominence without alveolar consolidations, pneumothorax nor effusion. Tiny nodular density is seen at the left lung apex unchanged from 2009 and likely benign finding. Vascular versus pleural calcifications at the right lung apex. Benign appearing subcortical cyst of the right humeral head superolaterally. IMPRESSION: Stable borderline  cardiomegaly with aortic atherosclerosis. Stable left apical nodular density since 2009 consistent with a benign finding. No acute pulmonary disease. Electronically Signed   By: Ashley Royalty M.D.   On: 02/23/2016 20:02   Ct Head Wo Contrast  Result Date: 02/23/2016 CLINICAL DATA:  Somnolence EXAM: CT HEAD WITHOUT CONTRAST TECHNIQUE: Contiguous axial images were obtained from the base of the skull through the vertex without intravenous contrast. COMPARISON:  05/02/2014 CT FINDINGS: BRAIN: There is mild sulcal prominence consistent with superficial atrophy. No intraparenchymal hemorrhage, mass effect nor midline shift. There are chronic appearing patchy periventricular and subcortical white matter hypodensities consistent with mild small vessel ischemic disease. The ventricles are not significantly dilated. No acute large vascular territory infarcts. No abnormal extra-axial fluid collections. Basal  cisterns are not effaced. Right-sided tentorial calcification. VASCULAR: Moderate calcific atherosclerosis of the carotid siphons. SKULL: No skull fracture. No significant scalp soft tissue swelling. SINUSES/ORBITS: The mastoid air-cells are clear. The included paranasal sinuses are well-aerated.The included ocular globes and orbital contents are non-suspicious. Bilateral lens replacements surgeries. OTHER: Mild asymmetric soft tissue swelling of the right eyelid relative to left. IMPRESSION: Chronic stable superficial atrophy with chronic small vessel ischemic disease of periventricular and subcortical white matter. No acute intracranial abnormality. Soft tissue swelling of the right eyelid. Electronically Signed   By: Ashley Royalty M.D.   On: 02/23/2016 21:58   Mr Brain Wo Contrast  Result Date: 02/24/2016 CLINICAL DATA:  Persistent lethargy.  Confusion. EXAM: MRI HEAD WITHOUT CONTRAST TECHNIQUE: Multiplanar, multiecho pulse sequences of the brain and surrounding structures were obtained without intravenous contrast.  COMPARISON:  CT head 02/23/2016.  No prior MRI is available. FINDINGS: Brain: Tiny (2-3 mm) focus of restricted diffusion, RIGHT posterior frontal cortex (image 43 series 100, image 22 series 101), represents a small acute infarct. Similar sized 2-3 mm focus, RIGHT lentiform nucleus superiorly, seen only on coronal DWI, indeterminate, but favored to represent a second acute infarct. Suspected acute infarct RIGHT midbrain, 2-3 mm, adjacent to the red nucleus, see image 23 series 100, image 25 series 101. An infarct in this area could result in altered sensorium. Low level restricted diffusion RIGHT centrum semiovale (image 35 series 100, image 22 series 101, also subcentimeter size, could represent a subacute ischemic deep white matter insult. No hemorrhage, mass lesion, hydrocephalus, or extra-axial fluid. Moderate atrophy. Extensive white matter disease, likely chronic microvascular ischemic change. Prominent perivascular spaces likely sequelae of longstanding hypertension, without areas of microbleeds/hemosiderin deposition Vascular: Flow voids are maintained throughout the carotid, basilar, and vertebral arteries. Skull and upper cervical spine: Normal marrow signal. Sinuses/Orbits: Negative.  BILATERAL cataract extraction. Other: Trace RIGHT mastoid effusion. IMPRESSION: Small acute nonhemorrhagic cortical infarct RIGHT posterior frontal cortex. Suspected acute infarct RIGHT paramedian midbrain. This could correlate with altered sensorium. Probable small acute nonhemorrhagic infarct, RIGHT lentiform nucleus, seen only on coronal DWI. Possible small subacute deep white matter infarct, RIGHT centrum semiovale. Moderate atrophy with extensive small vessel disease. Electronically Signed   By: Staci Righter M.D.   On: 02/24/2016 10:43    Assessment & Plan:   Problem List Items Addressed This Visit    CVA (cerebral vascular accident) ([place])    Embolic, multiple infarcts by MRI.  Given her fall risk, no  anticoagulation was given only anti platelet therapy with Plavix       Essential hypertension    bp has actually been low since discharge, amlodipine suspended until bp is 150/90      Hospital discharge follow-up     I have reviewed the records from the hospital admission in detail with patient's family member  today. Patient is stable post discharge and has no new issues or questions about discharge plans at the visit today for hospital follow up.       Senile dementia with psychosis    She appears sedated today, which Cindy Harvey be due to use of seroquel at bedtime. Medication suspended         I have discontinued Cindy Harvey's promethazine, GuaiFENesin (MUCINEX CHILDRENS PO), oseltamivir, and predniSONE. I have also changed her acetaminophen and fexofenadine. Additionally, I am having her start on guaiFENesin, EQ MULTIVITAMINS ADULT GUMMY, EMULSIFIED OMEGA-3, and promethazine. Lastly, I am having her maintain her Calcium Carbonate-Vitamin D, multivitamin, Nutritional Supplements (ENSURE  PO), Multiple Vitamins-Minerals (OCUVITE PO), niacin, amLODipine, lisinopril, Fish Oil, ezetimibe, meloxicam, PARoxetine, clopidogrel, and QUEtiapine.  Meds ordered this encounter  Medications  . acetaminophen (TYLENOL) 160 MG/5ML liquid    Sig: Take 10.2 mLs (325 mg total) by mouth daily as needed for fever (knee pain or headache).    Dispense:  473 mL    Refill:  5  . fexofenadine (ALLEGRA) 60 MG tablet    Sig: Take 1 tablet (60 mg total) by mouth daily.    Dispense:  30 tablet    Refill:  11  . guaiFENesin (MUCINEX CHEST CONGESTION CHILD) 100 MG/5ML liquid    Sig: Take 10 mLs (200 mg total) by mouth 3 (three) times daily as needed for cough or congestion.    Dispense:  120 mL    Refill:  3  . Multiple Vitamins-Minerals (EQ MULTIVITAMINS ADULT GUMMY) CHEW    Sig: Chew 1 tablet by mouth 2 (two) times daily before lunch and supper.    Dispense:  60 tablet    Refill:  11  . Omega-3 Fatty Acids  (EMULSIFIED OMEGA-3) [id number] MG/15ML LIQD    Sig: Take 5 mLs by mouth daily.    Dispense:  355 mL    Refill:  11  . promethazine (PHENERGAN) 12.5 MG tablet    Sig: Take 1 tablet (12.5 mg total) by mouth every 8 (eight) hours as needed for nausea or vomiting.    Dispense:  20 tablet    Refill:  3    Medications Discontinued During This Encounter  Medication Reason  . oseltamivir (TAMIFLU) 30 MG capsule Completed Course  . predniSONE (DELTASONE) 50 MG tablet Completed Course  . GuaiFENesin (MUCINEX CHILDRENS PO)   . acetaminophen (TYLENOL) 160 MG/5ML liquid Reorder  . fexofenadine (ALLEGRA) 60 MG tablet Reorder  . promethazine (PHENERGAN) 25 MG tablet     Follow-up: Return in about 3 months (around 06/01/2016).   Crecencio Mc, MD

## 2016-03-04 DIAGNOSIS — I129 Hypertensive chronic kidney disease with stage 1 through stage 4 chronic kidney disease, or unspecified chronic kidney disease: Secondary | ICD-10-CM | POA: Diagnosis not present

## 2016-03-04 DIAGNOSIS — F411 Generalized anxiety disorder: Secondary | ICD-10-CM | POA: Diagnosis not present

## 2016-03-04 DIAGNOSIS — M1711 Unilateral primary osteoarthritis, right knee: Secondary | ICD-10-CM | POA: Diagnosis not present

## 2016-03-04 DIAGNOSIS — E785 Hyperlipidemia, unspecified: Secondary | ICD-10-CM | POA: Diagnosis not present

## 2016-03-04 DIAGNOSIS — F0390 Unspecified dementia without behavioral disturbance: Secondary | ICD-10-CM | POA: Diagnosis not present

## 2016-03-04 DIAGNOSIS — N189 Chronic kidney disease, unspecified: Secondary | ICD-10-CM | POA: Diagnosis not present

## 2016-03-06 DIAGNOSIS — Z09 Encounter for follow-up examination after completed treatment for conditions other than malignant neoplasm: Secondary | ICD-10-CM | POA: Insufficient documentation

## 2016-03-06 NOTE — Assessment & Plan Note (Signed)
bp has actually been low since discharge, amlodipine suspended until bp is 150/90

## 2016-03-06 NOTE — Assessment & Plan Note (Signed)
She appears sedated today, which [place] be due to use of seroquel at bedtime. Medication suspended

## 2016-03-06 NOTE — Assessment & Plan Note (Signed)
I have reviewed the records from the hospital admission in detail with patient's family member  today. Patient is stable post discharge and has no new issues or questions about discharge plans at the visit today for hospital follow up.

## 2016-03-06 NOTE — Assessment & Plan Note (Signed)
Embolic, multiple infarcts by MRI.  Given her fall risk, no anticoagulation was given only anti platelet therapy with Plavix

## 2016-03-08 ENCOUNTER — Telehealth: Payer: Self-pay | Admitting: Internal Medicine

## 2016-03-08 DIAGNOSIS — F0390 Unspecified dementia without behavioral disturbance: Secondary | ICD-10-CM | POA: Diagnosis not present

## 2016-03-08 DIAGNOSIS — E785 Hyperlipidemia, unspecified: Secondary | ICD-10-CM | POA: Diagnosis not present

## 2016-03-08 DIAGNOSIS — F411 Generalized anxiety disorder: Secondary | ICD-10-CM | POA: Diagnosis not present

## 2016-03-08 DIAGNOSIS — N189 Chronic kidney disease, unspecified: Secondary | ICD-10-CM | POA: Diagnosis not present

## 2016-03-08 DIAGNOSIS — I129 Hypertensive chronic kidney disease with stage 1 through stage 4 chronic kidney disease, or unspecified chronic kidney disease: Secondary | ICD-10-CM | POA: Diagnosis not present

## 2016-03-08 DIAGNOSIS — M1711 Unilateral primary osteoarthritis, right knee: Secondary | ICD-10-CM | POA: Diagnosis not present

## 2016-03-08 NOTE — Telephone Encounter (Signed)
The patient was scheduled to be evaluated by [facility] last week, but due to a scheduling conflict the patients evaluation was moved to this week.

## 2016-03-09 DIAGNOSIS — I129 Hypertensive chronic kidney disease with stage 1 through stage 4 chronic kidney disease, or unspecified chronic kidney disease: Secondary | ICD-10-CM | POA: Diagnosis not present

## 2016-03-09 DIAGNOSIS — F411 Generalized anxiety disorder: Secondary | ICD-10-CM | POA: Diagnosis not present

## 2016-03-09 DIAGNOSIS — F0390 Unspecified dementia without behavioral disturbance: Secondary | ICD-10-CM | POA: Diagnosis not present

## 2016-03-09 DIAGNOSIS — M1711 Unilateral primary osteoarthritis, right knee: Secondary | ICD-10-CM | POA: Diagnosis not present

## 2016-03-09 DIAGNOSIS — E785 Hyperlipidemia, unspecified: Secondary | ICD-10-CM | POA: Diagnosis not present

## 2016-03-09 DIAGNOSIS — N189 Chronic kidney disease, unspecified: Secondary | ICD-10-CM | POA: Diagnosis not present

## 2016-03-11 DIAGNOSIS — M1711 Unilateral primary osteoarthritis, right knee: Secondary | ICD-10-CM | POA: Diagnosis not present

## 2016-03-11 DIAGNOSIS — F411 Generalized anxiety disorder: Secondary | ICD-10-CM | POA: Diagnosis not present

## 2016-03-11 DIAGNOSIS — N189 Chronic kidney disease, unspecified: Secondary | ICD-10-CM | POA: Diagnosis not present

## 2016-03-11 DIAGNOSIS — I129 Hypertensive chronic kidney disease with stage 1 through stage 4 chronic kidney disease, or unspecified chronic kidney disease: Secondary | ICD-10-CM | POA: Diagnosis not present

## 2016-03-11 DIAGNOSIS — F0390 Unspecified dementia without behavioral disturbance: Secondary | ICD-10-CM | POA: Diagnosis not present

## 2016-03-11 DIAGNOSIS — E785 Hyperlipidemia, unspecified: Secondary | ICD-10-CM | POA: Diagnosis not present

## 2016-03-12 ENCOUNTER — Telehealth: Payer: Self-pay | Admitting: Internal Medicine

## 2016-03-12 DIAGNOSIS — N189 Chronic kidney disease, unspecified: Secondary | ICD-10-CM | POA: Diagnosis not present

## 2016-03-12 DIAGNOSIS — F411 Generalized anxiety disorder: Secondary | ICD-10-CM | POA: Diagnosis not present

## 2016-03-12 DIAGNOSIS — F0390 Unspecified dementia without behavioral disturbance: Secondary | ICD-10-CM | POA: Diagnosis not present

## 2016-03-12 DIAGNOSIS — M1711 Unilateral primary osteoarthritis, right knee: Secondary | ICD-10-CM | POA: Diagnosis not present

## 2016-03-12 DIAGNOSIS — E785 Hyperlipidemia, unspecified: Secondary | ICD-10-CM | POA: Diagnosis not present

## 2016-03-12 DIAGNOSIS — I129 Hypertensive chronic kidney disease with stage 1 through stage 4 chronic kidney disease, or unspecified chronic kidney disease: Secondary | ICD-10-CM | POA: Diagnosis not present

## 2016-03-12 MED ORDER — QUETIAPINE FUMARATE 25 MG PO TABS: 12.5000 mg | ORAL_TABLET | Freq: Every day | ORAL | 5 refills | Status: DC

## 2016-03-12 NOTE — Telephone Encounter (Signed)
Pt daughter called back and stated that the rx needs to be sent to [facility]. Medication was suspended and was told if pt was agitated that Dr. Janne Lab o would refill. PT was out in the snow on Wednesday night and was pretty agitated. Please advise, thank you!  Fax # [phone]

## 2016-03-12 NOTE — Telephone Encounter (Signed)
Quetiapine (seroquEL) REFILLED.  Will need rx sent to [facility] asap

## 2016-03-12 NOTE — Telephone Encounter (Signed)
Patient daughter would like a refill for her mother on Quetiapine and a new Rx sent to [facility] at fax number [phone]. States she is having problems and will need refills. Please advise.\ LOV: 03/03/2016 Next OV  08/20/2016 Last refilled 02/26/2016

## 2016-03-12 NOTE — Telephone Encounter (Signed)
The patient's daughter left a message requesting to resume a medication for her mother . She did not state what medication she wanted to resume.

## 2016-03-12 NOTE — Telephone Encounter (Signed)
Spoke with patient She is needing a refill for Seroquel sent over to [facility] [phone]

## 2016-03-12 NOTE — Telephone Encounter (Signed)
Rx sent 

## 2016-03-12 NOTE — Addendum Note (Signed)
Addended by: Crecencio Mc on: 03/12/2016 11:48 AM   Modules accepted: Orders

## 2016-03-15 DIAGNOSIS — F0390 Unspecified dementia without behavioral disturbance: Secondary | ICD-10-CM | POA: Diagnosis not present

## 2016-03-15 DIAGNOSIS — I129 Hypertensive chronic kidney disease with stage 1 through stage 4 chronic kidney disease, or unspecified chronic kidney disease: Secondary | ICD-10-CM | POA: Diagnosis not present

## 2016-03-15 DIAGNOSIS — N189 Chronic kidney disease, unspecified: Secondary | ICD-10-CM | POA: Diagnosis not present

## 2016-03-15 DIAGNOSIS — F411 Generalized anxiety disorder: Secondary | ICD-10-CM | POA: Diagnosis not present

## 2016-03-15 DIAGNOSIS — M1711 Unilateral primary osteoarthritis, right knee: Secondary | ICD-10-CM | POA: Diagnosis not present

## 2016-03-15 DIAGNOSIS — E785 Hyperlipidemia, unspecified: Secondary | ICD-10-CM | POA: Diagnosis not present

## 2016-03-16 DIAGNOSIS — F411 Generalized anxiety disorder: Secondary | ICD-10-CM | POA: Diagnosis not present

## 2016-03-16 DIAGNOSIS — M1711 Unilateral primary osteoarthritis, right knee: Secondary | ICD-10-CM | POA: Diagnosis not present

## 2016-03-16 DIAGNOSIS — E785 Hyperlipidemia, unspecified: Secondary | ICD-10-CM | POA: Diagnosis not present

## 2016-03-16 DIAGNOSIS — F0390 Unspecified dementia without behavioral disturbance: Secondary | ICD-10-CM | POA: Diagnosis not present

## 2016-03-16 DIAGNOSIS — N189 Chronic kidney disease, unspecified: Secondary | ICD-10-CM | POA: Diagnosis not present

## 2016-03-16 DIAGNOSIS — I129 Hypertensive chronic kidney disease with stage 1 through stage 4 chronic kidney disease, or unspecified chronic kidney disease: Secondary | ICD-10-CM | POA: Diagnosis not present

## 2016-03-18 DIAGNOSIS — M1711 Unilateral primary osteoarthritis, right knee: Secondary | ICD-10-CM | POA: Diagnosis not present

## 2016-03-18 DIAGNOSIS — F0390 Unspecified dementia without behavioral disturbance: Secondary | ICD-10-CM | POA: Diagnosis not present

## 2016-03-18 DIAGNOSIS — F411 Generalized anxiety disorder: Secondary | ICD-10-CM | POA: Diagnosis not present

## 2016-03-18 DIAGNOSIS — E785 Hyperlipidemia, unspecified: Secondary | ICD-10-CM | POA: Diagnosis not present

## 2016-03-18 DIAGNOSIS — N189 Chronic kidney disease, unspecified: Secondary | ICD-10-CM | POA: Diagnosis not present

## 2016-03-18 DIAGNOSIS — I129 Hypertensive chronic kidney disease with stage 1 through stage 4 chronic kidney disease, or unspecified chronic kidney disease: Secondary | ICD-10-CM | POA: Diagnosis not present

## 2016-03-19 DIAGNOSIS — N189 Chronic kidney disease, unspecified: Secondary | ICD-10-CM | POA: Diagnosis not present

## 2016-03-19 DIAGNOSIS — E785 Hyperlipidemia, unspecified: Secondary | ICD-10-CM | POA: Diagnosis not present

## 2016-03-19 DIAGNOSIS — I129 Hypertensive chronic kidney disease with stage 1 through stage 4 chronic kidney disease, or unspecified chronic kidney disease: Secondary | ICD-10-CM | POA: Diagnosis not present

## 2016-03-19 DIAGNOSIS — M1711 Unilateral primary osteoarthritis, right knee: Secondary | ICD-10-CM | POA: Diagnosis not present

## 2016-03-19 DIAGNOSIS — F411 Generalized anxiety disorder: Secondary | ICD-10-CM | POA: Diagnosis not present

## 2016-03-19 DIAGNOSIS — F0390 Unspecified dementia without behavioral disturbance: Secondary | ICD-10-CM | POA: Diagnosis not present

## 2016-03-22 DIAGNOSIS — F411 Generalized anxiety disorder: Secondary | ICD-10-CM | POA: Diagnosis not present

## 2016-03-22 DIAGNOSIS — M1711 Unilateral primary osteoarthritis, right knee: Secondary | ICD-10-CM | POA: Diagnosis not present

## 2016-03-22 DIAGNOSIS — E785 Hyperlipidemia, unspecified: Secondary | ICD-10-CM | POA: Diagnosis not present

## 2016-03-22 DIAGNOSIS — I129 Hypertensive chronic kidney disease with stage 1 through stage 4 chronic kidney disease, or unspecified chronic kidney disease: Secondary | ICD-10-CM | POA: Diagnosis not present

## 2016-03-22 DIAGNOSIS — N189 Chronic kidney disease, unspecified: Secondary | ICD-10-CM | POA: Diagnosis not present

## 2016-03-22 DIAGNOSIS — F0390 Unspecified dementia without behavioral disturbance: Secondary | ICD-10-CM | POA: Diagnosis not present

## 2016-03-23 DIAGNOSIS — M1711 Unilateral primary osteoarthritis, right knee: Secondary | ICD-10-CM | POA: Diagnosis not present

## 2016-03-23 DIAGNOSIS — I129 Hypertensive chronic kidney disease with stage 1 through stage 4 chronic kidney disease, or unspecified chronic kidney disease: Secondary | ICD-10-CM | POA: Diagnosis not present

## 2016-03-23 DIAGNOSIS — F411 Generalized anxiety disorder: Secondary | ICD-10-CM | POA: Diagnosis not present

## 2016-03-23 DIAGNOSIS — E785 Hyperlipidemia, unspecified: Secondary | ICD-10-CM | POA: Diagnosis not present

## 2016-03-23 DIAGNOSIS — N189 Chronic kidney disease, unspecified: Secondary | ICD-10-CM | POA: Diagnosis not present

## 2016-03-23 DIAGNOSIS — F0390 Unspecified dementia without behavioral disturbance: Secondary | ICD-10-CM | POA: Diagnosis not present

## 2016-03-25 DIAGNOSIS — N189 Chronic kidney disease, unspecified: Secondary | ICD-10-CM | POA: Diagnosis not present

## 2016-03-25 DIAGNOSIS — F411 Generalized anxiety disorder: Secondary | ICD-10-CM | POA: Diagnosis not present

## 2016-03-25 DIAGNOSIS — F0390 Unspecified dementia without behavioral disturbance: Secondary | ICD-10-CM | POA: Diagnosis not present

## 2016-03-25 DIAGNOSIS — E785 Hyperlipidemia, unspecified: Secondary | ICD-10-CM | POA: Diagnosis not present

## 2016-03-25 DIAGNOSIS — I129 Hypertensive chronic kidney disease with stage 1 through stage 4 chronic kidney disease, or unspecified chronic kidney disease: Secondary | ICD-10-CM | POA: Diagnosis not present

## 2016-03-25 DIAGNOSIS — M1711 Unilateral primary osteoarthritis, right knee: Secondary | ICD-10-CM | POA: Diagnosis not present

## 2016-03-26 DIAGNOSIS — E785 Hyperlipidemia, unspecified: Secondary | ICD-10-CM | POA: Diagnosis not present

## 2016-03-26 DIAGNOSIS — N189 Chronic kidney disease, unspecified: Secondary | ICD-10-CM | POA: Diagnosis not present

## 2016-03-26 DIAGNOSIS — I129 Hypertensive chronic kidney disease with stage 1 through stage 4 chronic kidney disease, or unspecified chronic kidney disease: Secondary | ICD-10-CM | POA: Diagnosis not present

## 2016-03-26 DIAGNOSIS — F0390 Unspecified dementia without behavioral disturbance: Secondary | ICD-10-CM | POA: Diagnosis not present

## 2016-03-26 DIAGNOSIS — M1711 Unilateral primary osteoarthritis, right knee: Secondary | ICD-10-CM | POA: Diagnosis not present

## 2016-03-26 DIAGNOSIS — F411 Generalized anxiety disorder: Secondary | ICD-10-CM | POA: Diagnosis not present

## 2016-03-30 DIAGNOSIS — F0390 Unspecified dementia without behavioral disturbance: Secondary | ICD-10-CM | POA: Diagnosis not present

## 2016-03-30 DIAGNOSIS — M1711 Unilateral primary osteoarthritis, right knee: Secondary | ICD-10-CM | POA: Diagnosis not present

## 2016-03-30 DIAGNOSIS — N189 Chronic kidney disease, unspecified: Secondary | ICD-10-CM | POA: Diagnosis not present

## 2016-03-30 DIAGNOSIS — E785 Hyperlipidemia, unspecified: Secondary | ICD-10-CM | POA: Diagnosis not present

## 2016-03-30 DIAGNOSIS — I129 Hypertensive chronic kidney disease with stage 1 through stage 4 chronic kidney disease, or unspecified chronic kidney disease: Secondary | ICD-10-CM | POA: Diagnosis not present

## 2016-03-30 DIAGNOSIS — F411 Generalized anxiety disorder: Secondary | ICD-10-CM | POA: Diagnosis not present

## 2016-04-01 DIAGNOSIS — N189 Chronic kidney disease, unspecified: Secondary | ICD-10-CM | POA: Diagnosis not present

## 2016-04-01 DIAGNOSIS — F0390 Unspecified dementia without behavioral disturbance: Secondary | ICD-10-CM | POA: Diagnosis not present

## 2016-04-01 DIAGNOSIS — M1711 Unilateral primary osteoarthritis, right knee: Secondary | ICD-10-CM | POA: Diagnosis not present

## 2016-04-01 DIAGNOSIS — F411 Generalized anxiety disorder: Secondary | ICD-10-CM | POA: Diagnosis not present

## 2016-04-01 DIAGNOSIS — E785 Hyperlipidemia, unspecified: Secondary | ICD-10-CM | POA: Diagnosis not present

## 2016-04-01 DIAGNOSIS — I129 Hypertensive chronic kidney disease with stage 1 through stage 4 chronic kidney disease, or unspecified chronic kidney disease: Secondary | ICD-10-CM | POA: Diagnosis not present

## 2016-04-05 DIAGNOSIS — N189 Chronic kidney disease, unspecified: Secondary | ICD-10-CM | POA: Diagnosis not present

## 2016-04-05 DIAGNOSIS — I129 Hypertensive chronic kidney disease with stage 1 through stage 4 chronic kidney disease, or unspecified chronic kidney disease: Secondary | ICD-10-CM | POA: Diagnosis not present

## 2016-04-05 DIAGNOSIS — F411 Generalized anxiety disorder: Secondary | ICD-10-CM | POA: Diagnosis not present

## 2016-04-05 DIAGNOSIS — M1711 Unilateral primary osteoarthritis, right knee: Secondary | ICD-10-CM | POA: Diagnosis not present

## 2016-04-05 DIAGNOSIS — E785 Hyperlipidemia, unspecified: Secondary | ICD-10-CM | POA: Diagnosis not present

## 2016-04-05 DIAGNOSIS — F0390 Unspecified dementia without behavioral disturbance: Secondary | ICD-10-CM | POA: Diagnosis not present

## 2016-04-06 DIAGNOSIS — M1711 Unilateral primary osteoarthritis, right knee: Secondary | ICD-10-CM | POA: Diagnosis not present

## 2016-04-06 DIAGNOSIS — E785 Hyperlipidemia, unspecified: Secondary | ICD-10-CM | POA: Diagnosis not present

## 2016-04-06 DIAGNOSIS — I129 Hypertensive chronic kidney disease with stage 1 through stage 4 chronic kidney disease, or unspecified chronic kidney disease: Secondary | ICD-10-CM | POA: Diagnosis not present

## 2016-04-06 DIAGNOSIS — F0390 Unspecified dementia without behavioral disturbance: Secondary | ICD-10-CM | POA: Diagnosis not present

## 2016-04-06 DIAGNOSIS — F411 Generalized anxiety disorder: Secondary | ICD-10-CM | POA: Diagnosis not present

## 2016-04-06 DIAGNOSIS — N189 Chronic kidney disease, unspecified: Secondary | ICD-10-CM | POA: Diagnosis not present

## 2016-04-08 DIAGNOSIS — M1711 Unilateral primary osteoarthritis, right knee: Secondary | ICD-10-CM | POA: Diagnosis not present

## 2016-04-08 DIAGNOSIS — I129 Hypertensive chronic kidney disease with stage 1 through stage 4 chronic kidney disease, or unspecified chronic kidney disease: Secondary | ICD-10-CM | POA: Diagnosis not present

## 2016-04-08 DIAGNOSIS — E785 Hyperlipidemia, unspecified: Secondary | ICD-10-CM | POA: Diagnosis not present

## 2016-04-08 DIAGNOSIS — F411 Generalized anxiety disorder: Secondary | ICD-10-CM | POA: Diagnosis not present

## 2016-04-08 DIAGNOSIS — N189 Chronic kidney disease, unspecified: Secondary | ICD-10-CM | POA: Diagnosis not present

## 2016-04-08 DIAGNOSIS — F0390 Unspecified dementia without behavioral disturbance: Secondary | ICD-10-CM | POA: Diagnosis not present

## 2016-04-15 DIAGNOSIS — Z7689 Persons encountering health services in other specified circumstances: Secondary | ICD-10-CM | POA: Diagnosis not present

## 2016-04-18 DIAGNOSIS — Z7689 Persons encountering health services in other specified circumstances: Secondary | ICD-10-CM | POA: Diagnosis not present

## 2016-08-20 ENCOUNTER — Ambulatory Visit: Payer: Self-pay

## 2016-08-20 ENCOUNTER — Ambulatory Visit: Payer: Self-pay | Admitting: Internal Medicine

## 2016-09-02 ENCOUNTER — Telehealth: Payer: Self-pay

## 2016-09-02 ENCOUNTER — Ambulatory Visit: Payer: Self-pay | Admitting: Internal Medicine

## 2016-09-02 ENCOUNTER — Ambulatory Visit (INDEPENDENT_AMBULATORY_CARE_PROVIDER_SITE_OTHER): Payer: Medicare Other

## 2016-09-02 VITALS — BP 110/62 | HR 72 | Temp 98.0°F | Resp 14 | Ht 61.0 in | Wt 119.8 lb

## 2016-09-02 DIAGNOSIS — Z0000 Encounter for general adult medical examination without abnormal findings: Secondary | ICD-10-CM

## 2016-09-02 DIAGNOSIS — H9190 Unspecified hearing loss, unspecified ear: Secondary | ICD-10-CM

## 2016-09-02 NOTE — Telephone Encounter (Signed)
What is the referral for?a hearing test?   I need a reason/diagnosis

## 2016-09-02 NOTE — Telephone Encounter (Signed)
Denisa stated that she saw this pt today, pt's daughter was with her. They wanted to know if you could put in a referral for [facility] Dr. Arlie Solomons. Pt stated that she is already seeing this doctor but that she needs a referral before her next appt so she doesn't get charged for the appt.

## 2016-09-02 NOTE — Patient Instructions (Addendum)
  Cindy Harvey , Thank you for taking time to come for your Medicare Wellness Visit. I appreciate your ongoing commitment to your health goals. Please review the following plan we discussed and let me know if I can assist you in the future.   Follow up with Dr. Derrel Nip as needed.    Have a great day!  These are the goals we discussed: Goals    . Increase physical activity          Use walker, walk from one end of hall to the other, daily    . Increase water intake          Stay hydrated and drink plenty of fluids       This is a list of the screening recommended for you and due dates:  Health Maintenance  Topic Date Due  . Flu Shot  09/22/2016  . Tetanus Vaccine  06/26/2021  . DEXA scan (bone density measurement)  Completed  . Pneumonia vaccines  Completed

## 2016-09-02 NOTE — Progress Notes (Signed)
Subjective:   Cindy Harvey is a 83 y.o. female who presents for Medicare Annual (Subsequent) preventive examination.  Review of Systems:  No ROS.  Medicare Wellness Visit. Additional risk factors are reflected in the social history.  Cardiac Risk Factors include: advanced age (>13men, >76 women);hypertension     Objective:     Vitals: BP 110/62 (BP Location: Left Arm, Patient Position: Sitting, Cuff Size: Normal)   Pulse 72   Temp 98 F (36.7 C) (Oral)   Resp 14   Ht 5\' 1"  (1.549 m)   Wt 119 lb 12.8 oz (54.3 kg)   SpO2 96%   BMI 22.64 kg/m   Body mass index is 22.64 kg/m.   Tobacco History  Smoking Status  . Never Smoker  Smokeless Tobacco  . Never Used     Counseling given: Not Answered   Past Medical History:  Diagnosis Date  . Allergy    allergic rhinitis  . Hearing loss   . History of shingles   . Hypercholesterolemia   . Hypertension   . Macular degeneration   . Osteoarthritis    right knee  . Pneumonia 2009   with dehydration and electrolyte imbalance  . Urinary incontinence    Past Surgical History:  Procedure Laterality Date  . TONSILLECTOMY  1928   Family History  Problem Relation Age of Onset  . Stroke Sister   . Heart disease Mother   . Stroke Mother   . Stroke Sister   . Cancer Maternal Aunt        breast CA  . Cancer Cousin        breast cancer  . Stroke Sister    History  Sexual Activity  . Sexual activity: No    Outpatient Encounter Prescriptions as of 09/02/2016  Medication Sig  . acetaminophen (TYLENOL) 160 MG/5ML liquid Take 10.2 mLs (325 mg total) by mouth daily as needed for fever (knee pain or headache).  Marland Kitchen amLODipine (NORVASC) 5 MG tablet TAKE 1 TABLET (5 MG TOTAL) BY MOUTH DAILY.  . Calcium Carbonate-Vitamin D (CALCIUM 600+D) 600-200 MG-UNIT TABS Take 1 tablet by mouth daily.   . clopidogrel (PLAVIX) 75 MG tablet Take 1 tablet (75 mg total) by mouth daily.  Marland Kitchen ezetimibe (ZETIA) 10 MG tablet Take 1 tablet (10 mg  total) by mouth daily.  . fexofenadine (ALLEGRA) 60 MG tablet Take 1 tablet (60 mg total) by mouth daily.  Marland Kitchen guaiFENesin (MUCINEX CHEST CONGESTION CHILD) 100 MG/5ML liquid Take 10 mLs (200 mg total) by mouth 3 (three) times daily as needed for cough or congestion.  Marland Kitchen lisinopril (PRINIVIL,ZESTRIL) 20 MG tablet Take 10 mg by mouth daily.  . meloxicam (MOBIC) 7.5 MG tablet TAKE 1 TABLET (7.5 MG TOTAL) BY MOUTH DAILY.  . Multiple Vitamins-Minerals (EQ MULTIVITAMINS ADULT GUMMY) CHEW Chew 1 tablet by mouth 2 (two) times daily before lunch and supper.  . Multiple Vitamins-Minerals (OCUVITE PO) Take 1 tablet by mouth daily.   . multivitamin (THERAGRAN) per tablet Take 1 tablet by mouth daily.    . niacin (NIASPAN) 500 MG CR tablet Take 2 tablets (1,000 mg total) by mouth at bedtime.  . Nutritional Supplements (ENSURE PO) Take by mouth at bedtime.   . Omega-3 Fatty Acids (EMULSIFIED OMEGA-3) 433-2951 MG/15ML LIQD Take 5 mLs by mouth daily.  . Omega-3 Fatty Acids (FISH OIL) 1200 MG CAPS Take 1 tablet by mouth daily.  Marland Kitchen PARoxetine (PAXIL) 10 MG tablet Take 10 mg by mouth daily.  Marland Kitchen  promethazine (PHENERGAN) 12.5 MG tablet Take 1 tablet (12.5 mg total) by mouth every 8 (eight) hours as needed for nausea or vomiting.  Marland Kitchen QUEtiapine (SEROQUEL) 25 MG tablet Take 0.5 tablets (12.5 mg total) by mouth at bedtime.   No facility-administered encounter medications on file as of 09/02/2016.     Activities of Daily Living In your present state of health, do you have any difficulty performing the following activities: 09/02/2016 02/24/2016  Hearing? Cindy Harvey  Vision? Y Y  Difficulty concentrating or making decisions? Cindy Harvey  Walking or climbing stairs? Y Y  Dressing or bathing? Y N  Doing errands, shopping? Cindy Harvey  Preparing Food and eating ? Y -  Using the Toilet? N -  In the past six months, have you accidently leaked urine? N -  Do you have problems with loss of bowel control? N -  Managing your Medications? Y -  Managing  your Finances? Y -  Housekeeping or managing your Housekeeping? Y -  Some recent data might be hidden    Patient Care Team: Crecencio Mc, MD as PCP - General (Internal Medicine)    Assessment:    This is a routine wellness examination for Cindy Harvey. The goal of the wellness visit is to assist the patient how to close the gaps in care and create a preventative care plan for the patient.   The roster of all physicians providing medical care to patient is listed in the Snapshot section of the chart.  Taking calcium VIT D as appropriate/Osteoporosis risk reviewed.    Safety issues reviewed; Smoke and carbon monoxide detectors in the home. No firearms in the home.  Wears seatbelts when riding with others with others. Patient does wear sunscreen or protective clothing when in direct sunlight. No violence in the home.  Depression- PHQ 2 &9 complete.  Daughter states her depression has increased due to loneliness.  MMSE- not completed due to dx of senile dementia with psychosis.  Daily activities-some assistance required.  See clinical intake.  Ambulates with walker.  BMI- discussed the importance of a healthy diet, water intake and the benefits of aerobic exercise. She has a good appetite and regular diet provided by staff at [facility].  Encouraged to increase water intake and walk more down the halls, daily.  Dental- dentures  Sleep patterns- Sleeps 6 hours at night.  Naps during the day.  Anxiety increase at night related to fear.  Daughter believes the fear is related to being alone.  [facility] by [facility] Difficulty hearing while wearing hearing aid, bilateral. Referral requested for Audiolology follow up.  Health maintenance gaps- closed.  Patient Concerns: Follow up appointment scheduled with PCP for increased anxiety/depression and hearing referral.  Exercise Activities and Dietary recommendations Current Exercise Habits: Home exercise routine, Type  of exercise: walking, Frequency (Times/Week): 7, Intensity: Mild  Goals    . Increase physical activity          Use walker, walk from one end of hall to the other, daily    . Increase water intake          Stay hydrated and drink plenty of fluids      Fall Risk Fall Risk  09/02/2016 02/20/2016 08/21/2015 12/31/2013  Falls in the past year? No No No Yes  Number falls in past yr: - - - 1  Injury with Fall? - - - No  Risk Factor Category  - - - High Fall Risk  Risk  for fall due to : - - - Impaired balance/gait   Depression Screen PHQ 2/9 Scores 09/02/2016 02/20/2016 08/21/2015 12/31/2013  PHQ - 2 Score 2 0 0 0  PHQ- 9 Score 3 - - -     Cognitive Function MMSE - Mini Mental State Exam 09/02/2016  Not completed: Unable to complete        Immunization History  Administered Date(s) Administered  . Influenza Split 11/02/2010, 10/26/2011, 10/29/2014  . Influenza Whole 10/21/2008, 11/10/2009  . Influenza-Unspecified 10/23/2013, 09/29/2015, 09/30/2015  . PPD Test 09/30/2015, 11/04/2015  . Pneumococcal Conjugate-13 08/27/2013  . Pneumococcal Polysaccharide-23 10/24/2007, 02/28/2015  . Td 10/24/2008, 06/27/2011  . Zoster 09/27/2010   Screening Tests Health Maintenance  Topic Date Due  . INFLUENZA VACCINE  09/22/2016  . TETANUS/TDAP  06/26/2021  . DEXA SCAN  Completed  . PNA vac Low Risk Adult  Completed      Plan:    End of life planning; Advance aging; Advanced directives discussed. Copy of current HCPOA/Living Will on file.    I have personally reviewed and noted the following in the patient's chart:   . Medical and social history . Use of alcohol, tobacco or illicit drugs  . Current medications and supplements . Functional ability and status . Nutritional status . Physical activity . Advanced directives . List of other physicians . Hospitalizations, surgeries, and ER visits in previous 12 months . Vitals . Screenings to include cognitive, depression, and  falls . Referrals and appointments  In addition, I have reviewed and discussed with patient certain preventive protocols, quality metrics, and best practice recommendations. A written personalized care plan for preventive services as well as general preventive health recommendations were provided to patient.     Varney Biles, LPN  6/96/2952

## 2016-09-02 NOTE — Progress Notes (Signed)
Care was provided under my supervision. I agree with the management as indicated in the note.  Shakenya Stoneberg DO  

## 2016-09-03 NOTE — Telephone Encounter (Signed)
Patient need referral due to needs hearing test, insurance will not pay with out referral, patient has decreased hearing per daughter.

## 2016-09-03 NOTE — Telephone Encounter (Signed)
left message to return call to office 

## 2016-09-04 NOTE — Telephone Encounter (Signed)
Referral made. Thanks.

## 2016-09-10 ENCOUNTER — Encounter: Payer: Self-pay | Admitting: Internal Medicine

## 2016-09-10 ENCOUNTER — Ambulatory Visit (INDEPENDENT_AMBULATORY_CARE_PROVIDER_SITE_OTHER): Payer: Medicare Other | Admitting: Internal Medicine

## 2016-09-10 DIAGNOSIS — I10 Essential (primary) hypertension: Secondary | ICD-10-CM

## 2016-09-10 DIAGNOSIS — I639 Cerebral infarction, unspecified: Secondary | ICD-10-CM | POA: Diagnosis not present

## 2016-09-10 DIAGNOSIS — I63411 Cerebral infarction due to embolism of right middle cerebral artery: Secondary | ICD-10-CM

## 2016-09-10 DIAGNOSIS — F411 Generalized anxiety disorder: Secondary | ICD-10-CM | POA: Diagnosis not present

## 2016-09-10 MED ORDER — QUETIAPINE FUMARATE 25 MG PO TABS: 25.0000 mg | ORAL_TABLET | Freq: Every day | ORAL | 5 refills | Status: DC

## 2016-09-10 NOTE — Progress Notes (Signed)
Subjective:  Patient ID: Cindy Harvey, female    DOB: March 18, 1916  Age: 83 y.o. MRN: [id number]  CC: Diagnoses of Cerebrovascular accident (CVA) due to embolism of right middle cerebral artery ([place]), Generalized anxiety disorder, and Essential hypertension were pertinent to this visit.  HPI Cindy Harvey presents for follow up on GAD and hypertension,  With recent embolic CVA .  Patient also has dementia and presbyacusis .   She has been having increased fear and insomnia at night since the new director at [facility] removed the resident cat that stayed with her at night.  Her daughter notes that she has tried to barricade her door at night to keep out the intruders that she fears.  Patient's daughter has appealed his decision but he has declared that his decision is final.  4 lb weight loss noted since January.  Patient taking all meals at [facility]    Outpatient Medications Prior to Visit  Medication Sig Dispense Refill  . acetaminophen (TYLENOL) 160 MG/5ML liquid Take 10.2 mLs (325 mg total) by mouth daily as needed for fever (knee pain or headache). 473 mL 5  . amLODipine (NORVASC) 5 MG tablet TAKE 1 TABLET (5 MG TOTAL) BY MOUTH DAILY. 30 tablet 11  . Calcium Carbonate-Vitamin D (CALCIUM 600+D) 600-200 MG-UNIT TABS Take 1 tablet by mouth daily.     . clopidogrel (PLAVIX) 75 MG tablet Take 1 tablet (75 mg total) by mouth daily. 30 tablet 0  . ezetimibe (ZETIA) 10 MG tablet Take 1 tablet (10 mg total) by mouth daily. 30 tablet 11  . fexofenadine (ALLEGRA) 60 MG tablet Take 1 tablet (60 mg total) by mouth daily. 30 tablet 11  . guaiFENesin (MUCINEX CHEST CONGESTION CHILD) 100 MG/5ML liquid Take 10 mLs (200 mg total) by mouth 3 (three) times daily as needed for cough or congestion. 120 mL 3  . lisinopril (PRINIVIL,ZESTRIL) 20 MG tablet Take 10 mg by mouth daily.    . meloxicam (MOBIC) 7.5 MG tablet TAKE 1 TABLET (7.5 MG TOTAL) BY MOUTH DAILY. 30 tablet 5  . Multiple Vitamins-Minerals (EQ  MULTIVITAMINS ADULT GUMMY) CHEW Chew 1 tablet by mouth 2 (two) times daily before lunch and supper. 60 tablet 11  . Multiple Vitamins-Minerals (OCUVITE PO) Take 1 tablet by mouth daily.     . multivitamin (THERAGRAN) per tablet Take 1 tablet by mouth daily.      . niacin (NIASPAN) 500 MG CR tablet Take 2 tablets (1,000 mg total) by mouth at bedtime. 60 tablet 12  . Nutritional Supplements (ENSURE PO) Take by mouth at bedtime.     . Omega-3 Fatty Acids (EMULSIFIED OMEGA-3) 481-8563 MG/15ML LIQD Take 5 mLs by mouth daily. 355 mL 11  . Omega-3 Fatty Acids (FISH OIL) 1200 MG CAPS Take 1 tablet by mouth daily.    Marland Kitchen PARoxetine (PAXIL) 10 MG tablet Take 10 mg by mouth daily.    . promethazine (PHENERGAN) 12.5 MG tablet Take 1 tablet (12.5 mg total) by mouth every 8 (eight) hours as needed for nausea or vomiting. 20 tablet 3  . QUEtiapine (SEROQUEL) 25 MG tablet Take 0.5 tablets (12.5 mg total) by mouth at bedtime. 30 tablet 5   No facility-administered medications prior to visit.     Review of Systems;  Patient denies headache, fevers, malaise, unintentional weight loss, skin rash, eye pain, sinus congestion and sinus pain, sore throat, dysphagia,  hemoptysis , cough, dyspnea, wheezing, chest pain, palpitations, orthopnea, edema, abdominal pain, nausea, melena, diarrhea,  constipation, flank pain, dysuria, hematuria, urinary  Frequency, nocturia, numbness, tingling, seizures,  Focal weakness, Loss of consciousness,  Tremor,  depression, and suicidal ideation.      Objective:  BP (!) 190/68 (BP Location: Left Arm, Patient Position: Sitting, Cuff Size: Normal)   Pulse 87   Temp 98.2 F (36.8 C) (Oral)   Resp 15   Ht 5\' 1"  (1.549 m)   Wt 120 lb 3.2 oz (54.5 kg)   SpO2 97%   BMI 22.71 kg/m   BP Readings from Last 3 Encounters:  09/10/16 (!) 190/68  09/02/16 110/62  03/03/16 (!) 98/56    Wt Readings from Last 3 Encounters:  09/10/16 120 lb 3.2 oz (54.5 kg)  09/02/16 119 lb 12.8 oz (54.3  kg)  03/03/16 124 lb 6 oz (56.4 kg)    General appearance: alert, cooperative and appears stated age Ears: normal TM's and external ear canals both ears Throat: lips, mucosa, and tongue normal; teeth and gums normal Neck: no adenopathy, no carotid bruit, supple, symmetrical, trachea midline and thyroid not enlarged, symmetric, no tenderness/mass/nodules Back: symmetric, no curvature. ROM normal. No CVA tenderness. Lungs: clear to auscultation bilaterally Heart: regular rate and rhythm, S1, S2 normal, no murmur, click, rub or gallop Abdomen: soft, non-tender; bowel sounds normal; no masses,  no organomegaly Pulses: 2+ and symmetric Skin: Skin color, texture, turgor normal. No rashes or lesions Lymph nodes: Cervical, supraclavicular, and axillary nodes normal.  Lab Results  Component Value Date   HGBA1C 5.5 02/26/2016    Lab Results  Component Value Date   CREATININE 2.63 (H) 02/24/2016   CREATININE 3.01 (H) 02/23/2016   CREATININE 2.48 (H) 08/21/2015    Lab Results  Component Value Date   WBC 10.2 02/24/2016   HGB 10.6 (L) 02/24/2016   HCT 31.0 (L) 02/24/2016   PLT 298 02/24/2016   GLUCOSE 88 02/24/2016   CHOL 169 02/26/2016   TRIG 132 02/26/2016   HDL 39 (L) 02/26/2016   LDLDIRECT 164.7 01/31/2013   LDLCALC 104 (H) 02/26/2016   ALT 14 02/23/2016   AST 22 02/23/2016   NA 134 (L) 02/24/2016   K 4.1 02/24/2016   CL 105 02/24/2016   CREATININE 2.63 (H) 02/24/2016   BUN 41 (H) 02/24/2016   CO2 22 02/24/2016   TSH 0.52 09/26/2013   HGBA1C 5.5 02/26/2016    Dg Chest 2 View  Result Date: 02/23/2016 CLINICAL DATA:  Altered mental status and weakness. EXAM: CHEST  2 VIEW COMPARISON:  08/15/2007 FINDINGS: Mild cardiomegaly with aortic atherosclerosis. Mild diffuse age related interstitial prominence without alveolar consolidations, pneumothorax nor effusion. Tiny nodular density is seen at the left lung apex unchanged from 2009 and likely benign finding. Vascular versus  pleural calcifications at the right lung apex. Benign appearing subcortical cyst of the right humeral head superolaterally. IMPRESSION: Stable borderline cardiomegaly with aortic atherosclerosis. Stable left apical nodular density since 2009 consistent with a benign finding. No acute pulmonary disease. Electronically Signed   By: Ashley Royalty M.D.   On: 02/23/2016 20:02   Ct Head Wo Contrast  Result Date: 02/23/2016 CLINICAL DATA:  Somnolence EXAM: CT HEAD WITHOUT CONTRAST TECHNIQUE: Contiguous axial images were obtained from the base of the skull through the vertex without intravenous contrast. COMPARISON:  05/02/2014 CT FINDINGS: BRAIN: There is mild sulcal prominence consistent with superficial atrophy. No intraparenchymal hemorrhage, mass effect nor midline shift. There are chronic appearing patchy periventricular and subcortical white matter hypodensities consistent with mild small vessel ischemic disease. The  ventricles are not significantly dilated. No acute large vascular territory infarcts. No abnormal extra-axial fluid collections. Basal cisterns are not effaced. Right-sided tentorial calcification. VASCULAR: Moderate calcific atherosclerosis of the carotid siphons. SKULL: No skull fracture. No significant scalp soft tissue swelling. SINUSES/ORBITS: The mastoid air-cells are clear. The included paranasal sinuses are well-aerated.The included ocular globes and orbital contents are non-suspicious. Bilateral lens replacements surgeries. OTHER: Mild asymmetric soft tissue swelling of the right eyelid relative to left. IMPRESSION: Chronic stable superficial atrophy with chronic small vessel ischemic disease of periventricular and subcortical white matter. No acute intracranial abnormality. Soft tissue swelling of the right eyelid. Electronically Signed   By: Ashley Royalty M.D.   On: 02/23/2016 21:58   Mr Brain Wo Contrast  Result Date: 02/24/2016 CLINICAL DATA:  Persistent lethargy.  Confusion. EXAM: MRI  HEAD WITHOUT CONTRAST TECHNIQUE: Multiplanar, multiecho pulse sequences of the brain and surrounding structures were obtained without intravenous contrast. COMPARISON:  CT head 02/23/2016.  No prior MRI is available. FINDINGS: Brain: Tiny (2-3 mm) focus of restricted diffusion, RIGHT posterior frontal cortex (image 43 series 100, image 22 series 101), represents a small acute infarct. Similar sized 2-3 mm focus, RIGHT lentiform nucleus superiorly, seen only on coronal DWI, indeterminate, but favored to represent a second acute infarct. Suspected acute infarct RIGHT midbrain, 2-3 mm, adjacent to the red nucleus, see image 23 series 100, image 25 series 101. An infarct in this area could result in altered sensorium. Low level restricted diffusion RIGHT centrum semiovale (image 35 series 100, image 22 series 101, also subcentimeter size, could represent a subacute ischemic deep white matter insult. No hemorrhage, mass lesion, hydrocephalus, or extra-axial fluid. Moderate atrophy. Extensive white matter disease, likely chronic microvascular ischemic change. Prominent perivascular spaces likely sequelae of longstanding hypertension, without areas of microbleeds/hemosiderin deposition Vascular: Flow voids are maintained throughout the carotid, basilar, and vertebral arteries. Skull and upper cervical spine: Normal marrow signal. Sinuses/Orbits: Negative.  BILATERAL cataract extraction. Other: Trace RIGHT mastoid effusion. IMPRESSION: Small acute nonhemorrhagic cortical infarct RIGHT posterior frontal cortex. Suspected acute infarct RIGHT paramedian midbrain. This could correlate with altered sensorium. Probable small acute nonhemorrhagic infarct, RIGHT lentiform nucleus, seen only on coronal DWI. Possible small subacute deep white matter infarct, RIGHT centrum semiovale. Moderate atrophy with extensive small vessel disease. Electronically Signed   By: Staci Righter M.D.   On: 02/24/2016 10:43    Assessment & Plan:    Problem List Items Addressed This Visit    CVA (cerebral vascular accident) [facility])    She has no neurologic sequelae from her embolic CVA.  Continue plavix, suboptimal therapy due to risk of falling       Essential hypertension    Resuming amlodipine given her elevated reading today in office due to recent increase in anxiety .       Generalized anxiety disorder    Managed with paxil. nocturnal fears and paranoia addressed with increase in dose of seroqule today to 25 mg daily.          I have changed Cindy Harvey's QUEtiapine. I am also having her maintain her Calcium Carbonate-Vitamin D, multivitamin, Nutritional Supplements (ENSURE PO), Multiple Vitamins-Minerals (OCUVITE PO), niacin, amLODipine, lisinopril, Fish Oil, ezetimibe, meloxicam, PARoxetine, clopidogrel, acetaminophen, fexofenadine, guaiFENesin, EQ MULTIVITAMINS ADULT GUMMY, EMULSIFIED OMEGA-3, and promethazine.  Meds ordered this encounter  Medications  . QUEtiapine (SEROQUEL) 25 MG tablet    Sig: Take 1 tablet (25 mg total) by mouth at bedtime.    Dispense:  30 tablet  Refill:  5    Medications Discontinued During This Encounter  Medication Reason  . QUEtiapine (SEROQUEL) 25 MG tablet Reorder    Follow-up: No Follow-up on file.   Crecencio Mc, MD

## 2016-09-10 NOTE — Patient Instructions (Signed)
I recommend that we increase the Seroquel to 25 mg at night to help Cindy Harvey's nighttime fear  We can continue to increase this Medication if needed   We can also increase the PAXIL  TO 20 MG NEXT IF NEEDED TO IMPROVE HER MOOD

## 2016-09-12 NOTE — Assessment & Plan Note (Addendum)
Managed with paxil. nocturnal fears and paranoia addressed with increase in dose of seroqule today to 25 mg daily.

## 2016-09-12 NOTE — Assessment & Plan Note (Addendum)
Resuming amlodipine given her elevated reading today in office due to recent increase in anxiety .

## 2016-09-12 NOTE — Assessment & Plan Note (Signed)
She has no neurologic sequelae from her embolic CVA.  Continue plavix, suboptimal therapy due to risk of falling

## 2016-09-17 ENCOUNTER — Telehealth: Payer: Self-pay | Admitting: *Deleted

## 2016-09-17 NOTE — Telephone Encounter (Signed)
Thank you.  I authorize you to seign it for me.

## 2016-09-17 NOTE — Telephone Encounter (Signed)
Patient daughter stated that  Patient Seroquel never restarted at [facility] after hospitalization in January due to some how they have patient is allergic to Seroquel, patient daughter stated patient was taking 12.5 mg due to that 25 mg was just to much and she was cutting the 25 mg dose in half. Patient daughter would like to have Seroquel back but facility will not start with out written order and notification patient is not allergic to this medication. There is no documentation in chart of allergy . Called facility and they stated patient ha been receiving Seroquel 25 mg, since dose change on 09/10/16 patient is about to run out has enough til Monday because pharmacy will not fill due to [place] has allergy to Seroquel. Facility need Letter signed by PCP stating no Allergy to Seroquel. I have printed letter and placed in quick sign.

## 2016-09-17 NOTE — Telephone Encounter (Signed)
Patient daughter has requested to have a call in reference to pt's Seroquel  Contact Rosalyn [phone]

## 2016-09-20 NOTE — Telephone Encounter (Signed)
Faxed to [employer]

## 2016-09-27 ENCOUNTER — Ambulatory Visit: Payer: Self-pay | Admitting: Internal Medicine

## 2016-09-28 DIAGNOSIS — H353134 Nonexudative age-related macular degeneration, bilateral, advanced atrophic with subfoveal involvement: Secondary | ICD-10-CM | POA: Diagnosis not present

## 2016-09-28 DIAGNOSIS — Z961 Presence of intraocular lens: Secondary | ICD-10-CM | POA: Diagnosis not present

## 2016-09-28 DIAGNOSIS — D3132 Benign neoplasm of left choroid: Secondary | ICD-10-CM | POA: Diagnosis not present

## 2016-09-28 DIAGNOSIS — H43813 Vitreous degeneration, bilateral: Secondary | ICD-10-CM | POA: Diagnosis not present

## 2016-10-04 ENCOUNTER — Emergency Department: Payer: Medicare Other

## 2016-10-04 ENCOUNTER — Telehealth: Payer: Self-pay | Admitting: *Deleted

## 2016-10-04 ENCOUNTER — Encounter: Payer: Self-pay | Admitting: Emergency Medicine

## 2016-10-04 ENCOUNTER — Emergency Department
Admission: EM | Admit: 2016-10-04 | Discharge: 2016-10-04 | Disposition: A | Discharge status: Home / Self Care | Payer: Medicare Other | Attending: Emergency Medicine | Admitting: Emergency Medicine

## 2016-10-04 DIAGNOSIS — W19XXXA Unspecified fall, initial encounter: Secondary | ICD-10-CM | POA: Insufficient documentation

## 2016-10-04 DIAGNOSIS — Z79899 Other long term (current) drug therapy: Secondary | ICD-10-CM | POA: Insufficient documentation

## 2016-10-04 DIAGNOSIS — Z7902 Long term (current) use of antithrombotics/antiplatelets: Secondary | ICD-10-CM | POA: Diagnosis not present

## 2016-10-04 DIAGNOSIS — S2242XA Multiple fractures of ribs, left side, initial encounter for closed fracture: Secondary | ICD-10-CM | POA: Diagnosis not present

## 2016-10-04 DIAGNOSIS — M549 Dorsalgia, unspecified: Secondary | ICD-10-CM | POA: Diagnosis not present

## 2016-10-04 DIAGNOSIS — Y998 Other external cause status: Secondary | ICD-10-CM | POA: Diagnosis not present

## 2016-10-04 DIAGNOSIS — Z8673 Personal history of transient ischemic attack (TIA), and cerebral infarction without residual deficits: Secondary | ICD-10-CM | POA: Insufficient documentation

## 2016-10-04 DIAGNOSIS — Y92199 Unspecified place in other specified residential institution as the place of occurrence of the external cause: Secondary | ICD-10-CM | POA: Insufficient documentation

## 2016-10-04 DIAGNOSIS — M545 Low back pain: Secondary | ICD-10-CM | POA: Diagnosis not present

## 2016-10-04 DIAGNOSIS — S20219A Contusion of unspecified front wall of thorax, initial encounter: Secondary | ICD-10-CM | POA: Diagnosis not present

## 2016-10-04 DIAGNOSIS — I10 Essential (primary) hypertension: Secondary | ICD-10-CM | POA: Insufficient documentation

## 2016-10-04 DIAGNOSIS — S3993XA Unspecified injury of pelvis, initial encounter: Secondary | ICD-10-CM | POA: Diagnosis not present

## 2016-10-04 DIAGNOSIS — Y9301 Activity, walking, marching and hiking: Secondary | ICD-10-CM | POA: Diagnosis not present

## 2016-10-04 NOTE — ED Triage Notes (Signed)
Pt to ed via ems from assisted living at [facility].  EMS reports pt fall on Thursday last week.  Today pt daughter came to visit and pt reported pain to left flank and left rib area.  Pt with noted bruising to left flank/rib area.  Pt unable to explain fall or how she was able to get up, hx of dementia.  Pt was able to ambulate to stretcher for EMS today and usually walks with a walker.  Pt alert and oriented to self and place only.  No bruising or swelling noted to pt head.  Pt with small abrasion noted to left knee.   Upon arrival to ed dr Corky Downs at bedside.

## 2016-10-04 NOTE — Discharge Instructions (Signed)
Please take tylenol for pain and use your incentive spirometer frequently

## 2016-10-04 NOTE — Telephone Encounter (Signed)
Pt will need to have a ER follow up, please give a time and date  Please call daughter in reference to sleeping pill questions -and having an new order for a new Script for a different sleeping medication  Please contact daughter Thornton Dales [phone] Fax to [employer] [phone]

## 2016-10-04 NOTE — ED Provider Notes (Signed)
[facility] Emergency Department Provider Note   ____________________________________________    I have reviewed the triage vital signs and the nursing notes.   HISTORY  Chief Complaint Fall  History limited by dementia   HPI Cindy Harvey is a 83 y.o. female With a history of dementia who presents after reported fall. Apparently the patient fell 3-4 days ago. She lives in assisted living and has been ambulating but apparently complained to her daughter of pain in her back today where they noticed bruising.patient reports no pain when she moves her legs. She denies pain in her arms. No chest pain or dizziness. unclear if she is taken anything for the pain   Past Medical History:  Diagnosis Date  . Allergy    allergic rhinitis  . Hearing loss   . History of shingles   . Hypercholesterolemia   . Hypertension   . Macular degeneration   . Osteoarthritis    right knee  . Pneumonia 2009   with dehydration and electrolyte imbalance  . Urinary incontinence     Patient Active Problem List   Diagnosis Date Noted  . Hospital discharge follow-up 03/06/2016  . CVA (cerebral vascular accident) ([place]) 02/25/2016  . Medicare annual wellness visit, subsequent 08/23/2015  . 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 Generalized anxiety disorder 12/04/2013  . History of fall 09/28/2013  . DNR (do not resuscitate) discussion 09/26/2013  . Hyponatremia 09/26/2013  . Chronic renal insufficiency, stage III (moderate) 01/31/2013  . Heart murmur 07/21/2011  . Post-menopausal 07/14/2010  . Osteoporosis screening 07/14/2010  . Hordeolum 07/07/2010  . Depression (emotion) 05/21/2010  . HLD (hyperlipidemia) 10/24/2008  . Essential hypertension 10/24/2008  . ALLERGIC RHINITIS 10/24/2008  . Osteoarthrosis involving lower leg 10/24/2008    Past Surgical History:  Procedure Laterality Date  . TONSILLECTOMY  1928    Prior to Admission medications     Medication Sig Start Date End Date Taking? Authorizing Provider  acetaminophen (TYLENOL) 160 MG/5ML liquid Take 10.2 mLs (325 mg total) by mouth daily as needed for fever (knee pain or headache). 03/03/16  Yes Crecencio Mc, MD  amLODipine (NORVASC) 5 MG tablet TAKE 1 TABLET (5 MG TOTAL) BY MOUTH DAILY. 01/27/15  Yes Darlin Coco, MD  clopidogrel (PLAVIX) 75 MG tablet Take 1 tablet (75 mg total) by mouth daily. 02/27/16  Yes Sudini, Alveta Heimlich, MD  ezetimibe (ZETIA) 10 MG tablet Take 1 tablet (10 mg total) by mouth daily. 09/04/15  Yes Nahser, Wonda Cheng, MD  fexofenadine (ALLEGRA) 60 MG tablet Take 1 tablet (60 mg total) by mouth daily. 03/03/16 10/01/21 Yes Crecencio Mc, MD  guaiFENesin [facility] CHEST CONGESTION CHILD) 100 MG/5ML liquid Take 10 mLs (200 mg total) by mouth 3 (three) times daily as needed for cough or congestion. 03/03/16  Yes Crecencio Mc, MD  meloxicam (MOBIC) 7.5 MG tablet TAKE 1 TABLET (7.5 MG TOTAL) BY MOUTH DAILY. 11/14/15  Yes Crecencio Mc, MD  Multiple Vitamins-Minerals (EQ MULTIVITAMINS ADULT GUMMY) CHEW Chew 1 tablet by mouth 2 (two) times daily before lunch and supper. 03/03/16  Yes Crecencio Mc, MD  Multiple Vitamins-Minerals (OCUVITE PO) Take 1 tablet by mouth daily.    Yes [provider]  niacin (NIASPAN) 500 MG CR tablet Take 2 tablets (1,000 mg total) by mouth at bedtime. 02/12/11  Yes Darlin Coco, MD  Nutritional Supplements (ENSURE PO) Take by mouth at bedtime.    Yes [provider]  Omega-3 Fatty Acids (  FISH OIL) 1200 MG CAPS Take 1 tablet by mouth daily.   Yes [provider]  PARoxetine (PAXIL) 10 MG tablet Take 10 mg by mouth daily.   Yes [provider]  promethazine (PHENERGAN) 12.5 MG tablet Take 1 tablet (12.5 mg total) by mouth every 8 (eight) hours as needed for nausea or vomiting. 03/03/16  Yes Crecencio Mc, MD  QUEtiapine (SEROQUEL) 25 MG tablet Take 1 tablet (25 mg total) by mouth at bedtime. 09/10/16   Yes Crecencio Mc, MD  Calcium Carbonate-Vitamin D (CALCIUM 600+D) 600-200 MG-UNIT TABS Take 1 tablet by mouth daily.     [provider]  lisinopril (PRINIVIL,ZESTRIL) 20 MG tablet Take 10 mg by mouth daily. 01/09/13   [provider]     Allergies Asa buff (mag [buffered aspirin]; Lipitor [atorvastatin calcium]; and Zocor [simvastatin - high dose]  Family History  Problem Relation Age of Onset  . Stroke Sister   . Heart disease Mother   . Stroke Mother   . Stroke Sister   . Cancer Maternal Aunt        breast CA  . Cancer Cousin        breast cancer  . Stroke Sister     Social History Social History  Substance Use Topics  . Smoking status: Never Smoker  . Smokeless tobacco: Never Used  . Alcohol use No    Review of Systems  Constitutional: No dizziness Eyes: No visual changes.  ENT: No neck pain Cardiovascular: Denies chest pain. Respiratory: Denies shortness of breath. Gastrointestinal: No abdominal pain.     Genitourinary: Negative for dysuria. Musculoskeletal: positive for left lower back pain Skin: bruise to the back Neurological: Negative for focal weakness   ____________________________________________   PHYSICAL EXAM:  VITAL SIGNS: ED Triage Vitals  Enc Vitals Group     BP 10/04/16 0923 (!) 188/80     Pulse Rate 10/04/16 0923 88     Resp 10/04/16 0923 18     Temp 10/04/16 0923 (!) 97.2 F (36.2 C)     Temp Source 10/04/16 0923 Oral     SpO2 10/04/16 0923 97 %     Weight 10/04/16 0929 54.4 kg (120 lb)     Height --      Head Circumference --      Peak Flow --      Pain Score --      Pain Loc --      Pain Edu? --      Excl. in [place]? --     Constitutional: Alert.. No acute distress. Eyes: Conjunctivae are normal.  Head: Atraumatic. Nose: No congestion/rhinnorhea. Mouth/Throat: Mucous membranes are moist.   Neck:  Painless ROM, no vertebral tenderness to palpation Cardiovascular: Normal rate, regular rhythm. Grossly  normal heart sounds.  Good peripheral circulation.patient with tenderness to palpation along the left lower lateral chest wall, large bruise in this area as well, no bony abnormalities Respiratory: Normal respiratory effort.  No retractions. Lungs CTAB. Gastrointestinal: Soft and nontender. No distention.   Genitourinary: deferred Musculoskeletal: No lower extremity tenderness.full range of motion of all extremities. No vertebral tenderness to palpation. Warm and well perfused Neurologic:  Normal speech and language. No gross focal neurologic deficits are appreciated.  Skin:  Skin is warm, dry. Psychiatric: Mood and affect are normal. Speech and behavior are normal.  ____________________________________________   LABS (all labs ordered are listed, but only abnormal results are displayed)  Labs Reviewed - No data to display ____________________________________________  EKG  None ____________________________________________  RADIOLOGY  Left fourth and fifth rib fracture, nondisplaced ____________________________________________   PROCEDURES  Procedure(s) performed: No    Critical Care performed: No ____________________________________________   INITIAL IMPRESSION / ASSESSMENT AND PLAN / ED COURSE  Pertinent labs & imaging results that were available during my care of the patient were reviewed by me and considered in my medical decision making (see chart for details).  Patient overall well-appearing and in no acute distress. She does have bruising along the left lateral lower chest, suspect rib fractures.We will image the ribs, low back and pelvis  X-ray consistent with left lateral fourth and fifth rib fractures, discussed with daughter. We'll discharge with acetaminophen for analgesics, recommend ice and incentive spirometer which the patient has    ____________________________________________   FINAL CLINICAL IMPRESSION(S) / ED DIAGNOSES  Final diagnoses:  Fall,  initial encounter  Closed fracture of multiple ribs of left side, initial encounter      NEW MEDICATIONS STARTED DURING THIS VISIT:  Discharge Medication List as of 10/04/2016 10:34 AM       Note:  This document was prepared using Dragon voice recognition software and Mikaelian include unintentional dictation errors.    Lavonia Drafts, MD 10/04/16 1213

## 2016-10-04 NOTE — ED Notes (Signed)
See triage note for full assessment, pt breathing easy in nad, skin warm and dry,  Bruise as noted in triage note and abrasion to left knee present.  Pt alert but confused to time and situation and is unable to remember the fall on Thursday, although pt has extensive history of dementia.  Family at bedside at this time.

## 2016-10-05 NOTE — Telephone Encounter (Signed)
Spoke with pt's daughter and she stated that she pt had a fall last Thursday during the night to get up to use the restroom. She stated that the pt just started having pain yesterday so she was taken to the ED and dx with two broken ribs. Pt was advised to follow up with PCP there is not any appt available this week and next all there is, is either 11:30 or 4:30 spots. Where would you like to have the pt scheduled? Also daughter is concerned the increased dose of Seroquel is what could have caused the pt to fall during the night. So she is wondering if the dose can be decreased back down to the 12.5mg  verses the 25mg  she is currently taking. If so an order for the deceased dose and a new script will need to be faxed to [facility].

## 2016-10-05 NOTE — Telephone Encounter (Signed)
Please copy into letter to [facility]:  Stop seroquel 25 mg qhs  Start seroquel 12.5 mg daily   acetaminophen 15 ml (liquid)  every 6 hours x 1 week (already a prn on her list)     11;30 or 4:30 slot fine .  Let daughter know that broken ribs are not treated except with pain meds to encourage deep breathing . I would not treat her with anything more than tylenol 500 mg every 6 hours prn pain and meloxicma which she is already taking

## 2016-10-08 NOTE — Telephone Encounter (Signed)
LMTCB with pt's daughter 

## 2016-10-08 NOTE — Telephone Encounter (Signed)
Daughter has been notified and letter has been signed and printed.

## 2016-10-14 ENCOUNTER — Telehealth: Payer: Self-pay | Admitting: Internal Medicine

## 2016-10-14 NOTE — Telephone Encounter (Signed)
LETTER PRINTED

## 2016-10-14 NOTE — Telephone Encounter (Signed)
Yvette from [facility] called and stated that for the past 1-2 days patient has been having a hard time staying awake, and when she walks she is dragging her feet/legs. Please advise, thank you! Call @ [phone]

## 2016-10-14 NOTE — Telephone Encounter (Signed)
I think you wrote this on wrong patient.

## 2016-10-14 NOTE — Telephone Encounter (Signed)
How so?   The message from you is below and regarding this patient  And the seroquel dose.

## 2016-10-14 NOTE — Telephone Encounter (Signed)
The nurse from [facility] called and stated that patient fell 2 weeks ago and was evaluated at [facility]. MD at the facility changed her seroquel from 25mg  to 12.5mg  and also changed it to morning meds instead of Q HS. She is dragging her right foot when she walks with her walker. Nurse states that vitals have all been within normal limits. Please advise

## 2016-10-15 NOTE — Telephone Encounter (Signed)
The nurse at [facility] seems to think that the change in seroquel dose is what is making the patient so sleepy and also they were concerned about her dragging her right foot.

## 2016-10-19 ENCOUNTER — Telehealth: Payer: Self-pay | Admitting: Internal Medicine

## 2016-10-19 NOTE — Telephone Encounter (Signed)
Linda from [facility] called looking for an update on some pt orders that they had faxed over on 8/23. Please advise, thank you!  Call [place] @ [phone]

## 2016-10-19 NOTE — Telephone Encounter (Signed)
Called Vaughan Basta to let her know that we did not receive those orders. Vaughan Basta is re faxing the orders to [place].

## 2016-10-20 NOTE — Telephone Encounter (Signed)
Faxed orders to [place] at [facility]

## 2016-10-30 ENCOUNTER — Emergency Department
Admission: EM | Admit: 2016-10-30 | Discharge: 2016-10-31 | Disposition: A | Discharge status: Home / Self Care | Payer: Medicare Other | Attending: Emergency Medicine | Admitting: Emergency Medicine

## 2016-10-30 ENCOUNTER — Encounter: Payer: Self-pay | Admitting: Emergency Medicine

## 2016-10-30 DIAGNOSIS — I10 Essential (primary) hypertension: Secondary | ICD-10-CM | POA: Insufficient documentation

## 2016-10-30 DIAGNOSIS — R270 Ataxia, unspecified: Secondary | ICD-10-CM | POA: Insufficient documentation

## 2016-10-30 DIAGNOSIS — Y9301 Activity, walking, marching and hiking: Secondary | ICD-10-CM | POA: Insufficient documentation

## 2016-10-30 DIAGNOSIS — Z7902 Long term (current) use of antithrombotics/antiplatelets: Secondary | ICD-10-CM | POA: Insufficient documentation

## 2016-10-30 DIAGNOSIS — Z79899 Other long term (current) drug therapy: Secondary | ICD-10-CM | POA: Insufficient documentation

## 2016-10-30 DIAGNOSIS — F05 Delirium due to known physiological condition: Secondary | ICD-10-CM | POA: Diagnosis not present

## 2016-10-30 DIAGNOSIS — W19XXXA Unspecified fall, initial encounter: Secondary | ICD-10-CM | POA: Insufficient documentation

## 2016-10-30 DIAGNOSIS — Z043 Encounter for examination and observation following other accident: Secondary | ICD-10-CM | POA: Diagnosis not present

## 2016-10-30 DIAGNOSIS — Z8673 Personal history of transient ischemic attack (TIA), and cerebral infarction without residual deficits: Secondary | ICD-10-CM | POA: Diagnosis not present

## 2016-10-30 DIAGNOSIS — Y998 Other external cause status: Secondary | ICD-10-CM | POA: Insufficient documentation

## 2016-10-30 DIAGNOSIS — Y92129 Unspecified place in nursing home as the place of occurrence of the external cause: Secondary | ICD-10-CM | POA: Insufficient documentation

## 2016-10-30 DIAGNOSIS — S0990XA Unspecified injury of head, initial encounter: Secondary | ICD-10-CM | POA: Diagnosis not present

## 2016-10-30 NOTE — ED Provider Notes (Signed)
[facility] Emergency Department Provider Note    First MD Initiated Contact with Patient 10/30/16 2309     (approximate)  I have reviewed the triage vital signs and the nursing notes.   HISTORY  Chief Complaint Fall    HPI Cindy Harvey is a 83 y.o. female with blow list of chronic medical conditions including recent fall resultant rib injuries presents to the emergency department following an unwitnessed fall at [facility] facility. Per EMS the patient had a slip and fall on her way to the bathroom however patient states that she does not recall the reason for her fall. Patient denies any discomfort at this time. EMS personnel stated that the patient's daughter wanted the patient to be evaluated as a result of the fall.   Past Medical History:  Diagnosis Date  . Allergy    allergic rhinitis  . Hearing loss   . History of shingles   . Hypercholesterolemia   . Hypertension   . Macular degeneration   . Osteoarthritis    right knee  . Pneumonia 2009   with dehydration and electrolyte imbalance  . Urinary incontinence     Patient Active Problem List   Diagnosis Date Noted  . Hospital discharge follow-up 03/06/2016  . CVA (cerebral vascular accident) ([place]) 02/25/2016  . Medicare annual wellness visit, subsequent 08/23/2015  . 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 Generalized anxiety disorder 12/04/2013  . History of fall 09/28/2013  . DNR (do not resuscitate) discussion 09/26/2013  . Hyponatremia 09/26/2013  . Chronic renal insufficiency, stage III (moderate) 01/31/2013  . Heart murmur 07/21/2011  . Post-menopausal 07/14/2010  . Osteoporosis screening 07/14/2010  . Hordeolum 07/07/2010  . Depression (emotion) 05/21/2010  . HLD (hyperlipidemia) 10/24/2008  . Essential hypertension 10/24/2008  . ALLERGIC RHINITIS 10/24/2008  . Osteoarthrosis involving lower leg 10/24/2008    Past Surgical History:  Procedure Laterality Date   . TONSILLECTOMY  1928    Prior to Admission medications   Medication Sig Start Date End Date Taking? Authorizing Provider  acetaminophen (TYLENOL) 160 MG/5ML liquid Take 10.2 mLs (325 mg total) by mouth daily as needed for fever (knee pain or headache). 03/03/16   Crecencio Mc, MD  amLODipine (NORVASC) 5 MG tablet TAKE 1 TABLET (5 MG TOTAL) BY MOUTH DAILY. 01/27/15   Darlin Coco, MD  Calcium Carbonate-Vitamin D (CALCIUM 600+D) 600-200 MG-UNIT TABS Take 1 tablet by mouth daily.     [provider]  clopidogrel (PLAVIX) 75 MG tablet Take 1 tablet (75 mg total) by mouth daily. 02/27/16   Hillary Bow, MD  ezetimibe (ZETIA) 10 MG tablet Take 1 tablet (10 mg total) by mouth daily. 09/04/15   Nahser, Wonda Cheng, MD  fexofenadine (ALLEGRA) 60 MG tablet Take 1 tablet (60 mg total) by mouth daily. 03/03/16 10/01/21  Crecencio Mc, MD  guaiFENesin [facility] CHEST CONGESTION CHILD) 100 MG/5ML liquid Take 10 mLs (200 mg total) by mouth 3 (three) times daily as needed for cough or congestion. 03/03/16   Crecencio Mc, MD  lisinopril (PRINIVIL,ZESTRIL) 20 MG tablet Take 10 mg by mouth daily. 01/09/13   [provider]  meloxicam (MOBIC) 7.5 MG tablet TAKE 1 TABLET (7.5 MG TOTAL) BY MOUTH DAILY. 11/14/15   Crecencio Mc, MD  Multiple Vitamins-Minerals (EQ MULTIVITAMINS ADULT GUMMY) CHEW Chew 1 tablet by mouth 2 (two) times daily before lunch and supper. 03/03/16   Crecencio Mc, MD  Multiple Vitamins-Minerals (OCUVITE PO) Take  1 tablet by mouth daily.     [provider]  niacin (NIASPAN) 500 MG CR tablet Take 2 tablets (1,000 mg total) by mouth at bedtime. 02/12/11   Darlin Coco, MD  Nutritional Supplements (ENSURE PO) Take by mouth at bedtime.     [provider]  Omega-3 Fatty Acids (FISH OIL) 1200 MG CAPS Take 1 tablet by mouth daily.    [provider]  PARoxetine (PAXIL) 10 MG tablet Take 10 mg by mouth daily.    [provider]    promethazine (PHENERGAN) 12.5 MG tablet Take 1 tablet (12.5 mg total) by mouth every 8 (eight) hours as needed for nausea or vomiting. 03/03/16   Crecencio Mc, MD  QUEtiapine (SEROQUEL) 25 MG tablet Take 1 tablet (25 mg total) by mouth at bedtime. 09/10/16   Crecencio Mc, MD    Allergies Asa buff (mag [buffered aspirin]; Lipitor [atorvastatin calcium]; and Zocor [simvastatin - high dose]  Family History  Problem Relation Age of Onset  . Stroke Sister   . Heart disease Mother   . Stroke Mother   . Stroke Sister   . Cancer Maternal Aunt        breast CA  . Cancer Cousin        breast cancer  . Stroke Sister     Social History Social History  Substance Use Topics  . Smoking status: Never Smoker  . Smokeless tobacco: Never Used  . Alcohol use No    Review of Systems Constitutional: No fever/chills Eyes: No visual changes. ENT: No sore throat. Cardiovascular: Denies chest pain. Respiratory: Denies shortness of breath. Gastrointestinal: No abdominal pain.  No nausea, no vomiting.  No diarrhea.  No constipation. Genitourinary: Negative for dysuria. Musculoskeletal: Negative for neck pain.  Negative for back pain. Integumentary: Negative for rash. Neurological: Negative for headaches, focal weakness or numbness.  ____________________________________________   PHYSICAL EXAM:  VITAL SIGNS: ED Triage Vitals  Enc Vitals Group     BP      Pulse      Resp      Temp      Temp src      SpO2      Weight      Height      Head Circumference      Peak Flow      Pain Score      Pain Loc      Pain Edu?      Excl. in [place]?     Constitutional: Alert and oriented. Well appearing and in no acute distress. Eyes: Conjunctivae are normal. PERRL. EOMI. Head: Atraumatic. Mouth/Throat: Mucous membranes are moist. Oropharynx non-erythematous. Neck: No stridor.   Cardiovascular: Normal rate, regular rhythm. Good peripheral circulation. Grossly normal heart sounds. Respiratory:  Normal respiratory effort.  No retractions. Lungs CTAB. Gastrointestinal: Soft and nontender. No distention.  Musculoskeletal: No lower extremity tenderness nor edema. No gross deformities of extremities. Neurologic:  Normal speech and language. No gross focal neurologic deficits are appreciated.  Skin:  Skin is warm, dry and intact. No rash noted. Psychiatric: Mood and affect are normal. Speech and behavior are normal.  ____________________________________________   LABS (all labs ordered are listed, but only abnormal results are displayed)  Labs Reviewed  CBC - Abnormal; Notable for the following:       Result Value   RBC 2.98 (*)    Hemoglobin 9.0 (*)    HCT 26.8 (*)    RDW 15.5 (*)  All other components within normal limits  COMPREHENSIVE METABOLIC PANEL - Abnormal; Notable for the following:    Sodium 132 (*)    BUN 31 (*)    Creatinine, Ser 2.70 (*)    Total Protein 6.4 (*)    Albumin 3.3 (*)    ALT 12 (*)    GFR calc non Af Amer 14 (*)    GFR calc Af Amer 16 (*)    All other components within normal limits  TROPONIN I  URINALYSIS, COMPLETE (UACMP) WITH MICROSCOPIC   ____________________________________________  EKG  ED ECG REPORT I, [place] Saskia Simerson, the attending physician, personally viewed and interpreted this ECG.   Date: 10/31/2016  EKG Time: 11:50 PM  Rate: 77  Rhythm: normal sinus rhythm  Axis: normal  Intervals:normal  ST&T Change: none  ____________________________________________  RADIOLOGY I, [place] N Sky Primo, personally viewed and evaluated these images (plain radiographs) as part of my medical decision making, as well as reviewing the written report by the radiologist.  Ct Head Wo Contrast  Result Date: 10/31/2016 CLINICAL DATA:  Slip and fall injury. Not witnessed. Ataxia. No loss of consciousness. EXAM: CT HEAD WITHOUT CONTRAST TECHNIQUE: Contiguous axial images were obtained from the base of the skull through the vertex without  intravenous contrast. COMPARISON:  MRI brain 02/24/2016.  CT head 02/23/2016. FINDINGS: Brain: Examination is technically limited due to motion artifact. Mild diffuse cerebral atrophy. Low-attenuation changes in the deep white matter consistent with small vessel ischemia. No ventricular dilatation. No mass effect or midline shift. No abnormal extra-axial fluid collections. Gray-white matter junctions are distinct. Basal cisterns are not effaced. No acute intracranial hemorrhage. Vascular: Calcifications in the internal carotid arteries. Skull: Normal. Negative for fracture or focal lesion. Sinuses/Orbits: No acute finding. Other: Scattered dermal calcifications in the scalp. No significant changes since previous study. IMPRESSION: No acute intracranial abnormalities. Chronic atrophy and small vessel ischemic changes. Electronically Signed   By: Lucienne Capers M.D.   On: 10/31/2016 00:55      Procedures   ____________________________________________   INITIAL IMPRESSION / ASSESSMENT AND PLAN / ED COURSE  Pertinent labs & imaging results that were available during my care of the patient were reviewed by me and considered in my medical decision making (see chart for details).  No identified injury noted. Spoke with the patient and her daughter at length. Patient will be discharged back to [facility] at this time      ____________________________________________  FINAL CLINICAL IMPRESSION(S) / ED DIAGNOSES  Final diagnoses:  Fall, initial encounter     MEDICATIONS GIVEN DURING THIS VISIT:  Medications - No data to display   NEW OUTPATIENT MEDICATIONS STARTED DURING THIS VISIT:  New Prescriptions   No medications on file    Modified Medications   No medications on file    Discontinued Medications   No medications on file     Note:  This document was prepared using Dragon voice recognition software and [place] include unintentional dictation errors.    Gregor Hams,  MD 10/31/16 0130

## 2016-10-30 NOTE — ED Triage Notes (Signed)
Pt. Here via EMS from [facility] for a unwitnessed fall.  Pt. Does not complain of any injury.  Pt. States she slipped in bathroom.  Staff report pt. No LOC.  Pt. Last fall was one month ago and sustained some fractured ribs.

## 2016-10-31 ENCOUNTER — Emergency Department: Payer: Medicare Other

## 2016-10-31 DIAGNOSIS — S0990XA Unspecified injury of head, initial encounter: Secondary | ICD-10-CM | POA: Diagnosis not present

## 2016-10-31 DIAGNOSIS — Z043 Encounter for examination and observation following other accident: Secondary | ICD-10-CM | POA: Diagnosis not present

## 2016-10-31 LAB — COMPREHENSIVE METABOLIC PANEL
ALK PHOS: 71 U/L (ref 38–126)
ALT: 12 U/L — AB (ref 14–54)
ANION GAP: 6 (ref 5–15)
AST: 15 U/L (ref 15–41)
Albumin: 3.3 g/dL — ABNORMAL LOW (ref 3.5–5.0)
BUN: 31 mg/dL — ABNORMAL HIGH (ref 6–20)
CALCIUM: 9 mg/dL (ref 8.9–10.3)
CHLORIDE: 102 mmol/L (ref 101–111)
CO2: 24 mmol/L (ref 22–32)
Creatinine, Ser: 2.7 mg/dL — ABNORMAL HIGH (ref 0.44–1.00)
GFR calc non Af Amer: 14 mL/min — ABNORMAL LOW (ref >60)
GFR, EST AFRICAN AMERICAN: 16 mL/min — AB (ref >60)
Glucose, Bld: 86 mg/dL (ref 65–99)
Potassium: 4.7 mmol/L (ref 3.5–5.1)
SODIUM: 132 mmol/L — AB (ref 135–145)
Total Bilirubin: 0.3 mg/dL (ref 0.3–1.2)
Total Protein: 6.4 g/dL — ABNORMAL LOW (ref 6.5–8.1)

## 2016-10-31 LAB — CBC
HCT: 26.8 % — ABNORMAL LOW (ref 35.0–47.0)
HEMOGLOBIN: 9 g/dL — AB (ref 12.0–16.0)
MCH: 30.4 pg (ref 26.0–34.0)
MCHC: 33.8 g/dL (ref 32.0–36.0)
MCV: 90 fL (ref 80.0–100.0)
PLATELETS: 303 10*3/uL (ref 150–440)
RBC: 2.98 MIL/uL — AB (ref 3.80–5.20)
RDW: 15.5 % — ABNORMAL HIGH (ref 11.5–14.5)
WBC: 8 10*3/uL (ref 3.6–11.0)

## 2016-10-31 LAB — TROPONIN I: Troponin I: <0.03 ng/mL (ref <0.03)

## 2016-10-31 NOTE — ED Notes (Signed)
Pt. Going home with daughter. 

## 2016-11-03 DIAGNOSIS — M6281 Muscle weakness (generalized): Secondary | ICD-10-CM | POA: Diagnosis not present

## 2016-11-03 DIAGNOSIS — R262 Difficulty in walking, not elsewhere classified: Secondary | ICD-10-CM | POA: Diagnosis not present

## 2016-11-04 DIAGNOSIS — R262 Difficulty in walking, not elsewhere classified: Secondary | ICD-10-CM | POA: Diagnosis not present

## 2016-11-04 DIAGNOSIS — M6281 Muscle weakness (generalized): Secondary | ICD-10-CM | POA: Diagnosis not present

## 2016-11-05 ENCOUNTER — Ambulatory Visit: Payer: Self-pay | Admitting: Cardiovascular Disease

## 2016-11-09 DIAGNOSIS — M6281 Muscle weakness (generalized): Secondary | ICD-10-CM | POA: Diagnosis not present

## 2016-11-09 DIAGNOSIS — R262 Difficulty in walking, not elsewhere classified: Secondary | ICD-10-CM | POA: Diagnosis not present

## 2016-11-10 ENCOUNTER — Telehealth: Payer: Self-pay | Admitting: Internal Medicine

## 2016-11-10 DIAGNOSIS — R262 Difficulty in walking, not elsewhere classified: Secondary | ICD-10-CM | POA: Diagnosis not present

## 2016-11-10 DIAGNOSIS — M6281 Muscle weakness (generalized): Secondary | ICD-10-CM | POA: Diagnosis not present

## 2016-11-10 DIAGNOSIS — M21372 Foot drop, left foot: Secondary | ICD-10-CM

## 2016-11-10 NOTE — Telephone Encounter (Signed)
This was sent to me by mistake

## 2016-11-10 NOTE — Telephone Encounter (Signed)
Called PT back states that she has been doing PT and has noticed that she is had some weakness and drop in left foot would like order for AFO. If ok will need order Faxed to [phone]

## 2016-11-10 NOTE — Telephone Encounter (Signed)
Charity from [employer] at [employer] called and is looking for an AFO on pt. They have noticed a foot drop on the left foot in the last 2 weeks. Pleas advise, thank you!  Call [facility] [phone]

## 2016-11-11 NOTE — Telephone Encounter (Signed)
DME FOR AFO LEFT FOOT ORDERED AND PRINTED

## 2016-11-11 NOTE — Telephone Encounter (Signed)
DME order faxed to number provided below.

## 2016-11-18 ENCOUNTER — Telehealth: Payer: Self-pay | Admitting: *Deleted

## 2016-11-18 DIAGNOSIS — R262 Difficulty in walking, not elsewhere classified: Secondary | ICD-10-CM | POA: Diagnosis not present

## 2016-11-18 DIAGNOSIS — M6281 Muscle weakness (generalized): Secondary | ICD-10-CM | POA: Diagnosis not present

## 2016-11-18 NOTE — Telephone Encounter (Signed)
Spoke with Merry Proud to let him know that we had faxed them over on 11/11/2016. Asked Merry Proud if we had the correct fax number and her stated that we did so I resent them today.

## 2016-11-18 NOTE — Telephone Encounter (Signed)
Merry Proud from [facility] stated that the order was not received  Contact [phone]

## 2016-11-23 DIAGNOSIS — M6281 Muscle weakness (generalized): Secondary | ICD-10-CM | POA: Diagnosis not present

## 2016-11-23 DIAGNOSIS — R262 Difficulty in walking, not elsewhere classified: Secondary | ICD-10-CM | POA: Diagnosis not present

## 2016-11-24 ENCOUNTER — Telehealth: Payer: Self-pay | Admitting: Internal Medicine

## 2016-11-24 DIAGNOSIS — Z0279 Encounter for issue of other medical certificate: Secondary | ICD-10-CM

## 2016-11-24 NOTE — Telephone Encounter (Signed)
FL-2 form completed.  Charge is $50

## 2016-11-25 NOTE — Telephone Encounter (Signed)
FL2 forms have been mailed out. Copy has been put to scan and one copy placed in charge folder up front.

## 2016-11-30 DIAGNOSIS — R262 Difficulty in walking, not elsewhere classified: Secondary | ICD-10-CM | POA: Diagnosis not present

## 2016-11-30 DIAGNOSIS — M6281 Muscle weakness (generalized): Secondary | ICD-10-CM | POA: Diagnosis not present

## 2016-12-03 DIAGNOSIS — R262 Difficulty in walking, not elsewhere classified: Secondary | ICD-10-CM | POA: Diagnosis not present

## 2016-12-03 DIAGNOSIS — M6281 Muscle weakness (generalized): Secondary | ICD-10-CM | POA: Diagnosis not present

## 2016-12-07 DIAGNOSIS — R262 Difficulty in walking, not elsewhere classified: Secondary | ICD-10-CM | POA: Diagnosis not present

## 2016-12-07 DIAGNOSIS — M6281 Muscle weakness (generalized): Secondary | ICD-10-CM | POA: Diagnosis not present

## 2016-12-15 DIAGNOSIS — H903 Sensorineural hearing loss, bilateral: Secondary | ICD-10-CM | POA: Diagnosis not present

## 2016-12-22 DIAGNOSIS — M6281 Muscle weakness (generalized): Secondary | ICD-10-CM | POA: Diagnosis not present

## 2016-12-22 DIAGNOSIS — R262 Difficulty in walking, not elsewhere classified: Secondary | ICD-10-CM | POA: Diagnosis not present

## 2016-12-30 ENCOUNTER — Encounter: Payer: Self-pay | Admitting: Cardiovascular Disease

## 2016-12-30 ENCOUNTER — Ambulatory Visit (INDEPENDENT_AMBULATORY_CARE_PROVIDER_SITE_OTHER): Payer: Medicare Other | Admitting: Cardiovascular Disease

## 2016-12-30 VITALS — BP 142/62 | HR 80 | Ht 61.0 in | Wt 110.8 lb

## 2016-12-30 DIAGNOSIS — I639 Cerebral infarction, unspecified: Secondary | ICD-10-CM

## 2016-12-30 DIAGNOSIS — I10 Essential (primary) hypertension: Secondary | ICD-10-CM

## 2016-12-30 NOTE — Progress Notes (Signed)
Cardiology Office Note   Date:  12/30/2016   ID:  Cindy Harvey, DOB 1916/10/21, MRN [id number]  PCP:  Crecencio Mc, MD  Cardiologist:  Previously Darlin Coco MD, now [place]  Chief Complaint  Patient presents with  . Hypertension   Problem List 1. HTN 2. Aortic stenosis    History of Present Illness: Cindy Harvey is a 83 y.o. female who presents for a six-month follow-up office visit.  She has a history of high blood pressure and high cholesterol. She does not have any history of ischemic heart disease. She has had some problems with poor balance. She has been feeling well with no new complaints today. She has a known mild heart murmur and had an echocardiogram 07/27/11 showed an ejection fraction of 55-60% and a mild aortic stenosis with mild aortic insufficiency. Her daughter states that the patient has a history of seasonal affective disorder.  July 30, 2015:  Seen with [name]   ( daughter )  ( Bob's wife )   Nov. 8, 2018:  Cindy Harvey is seen with [name] ( daughter)  Hx of HTN and hyperlipidemia . Lives at [facility] ( assisted living )  No complaints   Past Medical History:  Diagnosis Date  . Allergy    allergic rhinitis  . Hearing loss   . History of shingles   . Hypercholesterolemia   . Hypertension   . Macular degeneration   . Osteoarthritis    right knee  . Pneumonia 2009   with dehydration and electrolyte imbalance  . Urinary incontinence     Past Surgical History:  Procedure Laterality Date  . TONSILLECTOMY  1928     Current Outpatient Medications  Medication Sig Dispense Refill  . acetaminophen (TYLENOL) 160 MG/5ML liquid Take 10.2 mLs (325 mg total) by mouth daily as needed for fever (knee pain or headache). 473 mL 5  . amLODipine (NORVASC) 5 MG tablet TAKE 1 TABLET (5 MG TOTAL) BY MOUTH DAILY. 30 tablet 11  . Calcium Carbonate-Vitamin D (CALCIUM 600+D) 600-200 MG-UNIT TABS Take 1 tablet by mouth daily.     .  clopidogrel (PLAVIX) 75 MG tablet Take 1 tablet (75 mg total) by mouth daily. 30 tablet 0  . ezetimibe (ZETIA) 10 MG tablet Take 1 tablet (10 mg total) by mouth daily. 30 tablet 11  . fexofenadine (ALLEGRA) 60 MG tablet Take 1 tablet (60 mg total) by mouth daily. 30 tablet 11  . guaiFENesin (MUCINEX CHEST CONGESTION CHILD) 100 MG/5ML liquid Take 10 mLs (200 mg total) by mouth 3 (three) times daily as needed for cough or congestion. 120 mL 3  . lisinopril (PRINIVIL,ZESTRIL) 20 MG tablet Take 10 mg by mouth daily.    . meloxicam (MOBIC) 7.5 MG tablet TAKE 1 TABLET (7.5 MG TOTAL) BY MOUTH DAILY. 30 tablet 5  . Multiple Vitamins-Minerals (EQ MULTIVITAMINS ADULT GUMMY) CHEW Chew 1 tablet by mouth 2 (two) times daily before lunch and supper. 60 tablet 11  . Multiple Vitamins-Minerals (OCUVITE PO) Take 1 tablet by mouth daily.     . niacin (NIASPAN) 500 MG CR tablet Take 2 tablets (1,000 mg total) by mouth at bedtime. 60 tablet 12  . Nutritional Supplements (ENSURE PO) Take by mouth at bedtime.     . Omega-3 Fatty Acids (FISH OIL) 1200 MG CAPS Take 1 tablet by mouth daily.    Marland Kitchen PARoxetine (PAXIL) 10 MG tablet Take 10 mg by mouth daily.    Marland Kitchen  promethazine (PHENERGAN) 12.5 MG tablet Take 1 tablet (12.5 mg total) by mouth every 8 (eight) hours as needed for nausea or vomiting. 20 tablet 3  . QUEtiapine (SEROQUEL) 25 MG tablet Take 1 tablet (25 mg total) by mouth at bedtime. 30 tablet 5   No current facility-administered medications for this visit.     Allergies:   Asa buff (mag [buffered aspirin]; Lipitor [atorvastatin calcium]; and Zocor [simvastatin - high dose]    Social History:  The patient  reports that  has never smoked. she has never used smokeless tobacco. She reports that she does not drink alcohol or use drugs.   Family History:  The patient's family history includes Cancer in her cousin and maternal aunt; Heart disease in her mother; Stroke in her mother, sister, sister, and sister.     ROS:  Please see the history of present illness.   Otherwise, review of systems are positive for none.   All other systems are reviewed and negative.    Physical Exam: Blood pressure (!) 142/62, pulse 80, height 5\' 1"  (1.549 m), weight 110 lb 12.8 oz (50.3 kg), SpO2 98 %.  GEN: Elderly, frail female.  No acute distress HEENT: Normal NECK: No JVD; No carotid bruits LYMPHATICS: No lymphadenopathy CARDIAC: RR, soft systolic murmur, no rubs, gallops RESPIRATORY:  Clear to auscultation without rales, wheezing or rhonchi  ABDOMEN: Soft, non-tender, non-distended MUSCULOSKELETAL: thin,  SKIN: Warm and dry NEUROLOGIC:  Dementia ,  Answers question  EKG:     Recent Labs: 10/30/2016: ALT 12; BUN 31; Creatinine, Ser 2.70; Hemoglobin 9.0; Platelets 303; Potassium 4.7; Sodium 132    Lipid Panel    Component Value Date/Time   CHOL 169 02/26/2016 0456   TRIG 132 02/26/2016 0456   HDL 39 (L) 02/26/2016 0456   CHOLHDL 4.3 02/26/2016 0456   VLDL 26 02/26/2016 0456   LDLCALC 104 (H) 02/26/2016 0456   LDLDIRECT 164.7 01/31/2013 0809      Wt Readings from Last 3 Encounters:  12/30/16 110 lb 12.8 oz (50.3 kg)  10/30/16 100 lb (45.4 kg)  10/04/16 120 lb (54.4 kg)     ASSESSMENT AND PLAN:  1.  Essential hypertension-blood pressure is well controlled.  Continue current medications  2.  Hyperlipidemia -stable  3.  Mild aortic stenosis -stable  I will see her in 1 year   Current medicines are reviewed at length with the patient today.  The patient does not have concerns regarding medicines.  The following changes have been made:  no change  Labs/ tests ordered today include:   No orders of the defined types were placed in this encounter.     Mertie Moores, MD  12/30/2016 2:54 PM    [address] [place],  [place], [place]  [address] Pager [phone] Phone: [phone]; Fax: [phone]

## 2016-12-30 NOTE — Patient Instructions (Signed)

## 2017-03-01 ENCOUNTER — Telehealth: Payer: Self-pay | Admitting: Cardiovascular Disease

## 2017-03-01 NOTE — Telephone Encounter (Signed)
New message     Please call nurse at assisted living needs to verify patients medication

## 2017-03-01 NOTE — Telephone Encounter (Signed)
Dr. Elmarie Shiley note placed in HIM to be faxed as requested

## 2017-03-01 NOTE — Telephone Encounter (Signed)
Spoke with Randal Buba at [facility] who is patient's caretaker. She states the patient's medication orders do not have lisinopril listed and the amlodipine has the PRN comment to give only if BP > 150/90 mmHg. She states the patient's BP is checked daily and is usually >150/90 mmHg. I advised that we also have lisinopril listed in patient's medications. Randal Buba states she does not know who d/c lisinopril but she tried calling Dr. Lupita Dawn office and was advised to call us for advice regarding these medications. She is requesting an order that states the amlodipine 5 mg is to be given daily. I advised I will route to Dr. Acie Fredrickson for order and fax to [phone] ATTN: Randal Buba. She verbalized understanding and thanked me for my help.

## 2017-03-01 NOTE — Telephone Encounter (Signed)
Creatinine is elevated.  This is also contributing to her HTN  She will likely not be able to take the Lisinopril Restart Amlodipine 5 mg a day

## 2017-03-04 ENCOUNTER — Telehealth: Payer: Self-pay | Admitting: Cardiovascular Disease

## 2017-03-04 NOTE — Telephone Encounter (Signed)
Error

## 2017-03-28 ENCOUNTER — Telehealth: Payer: Self-pay | Admitting: Internal Medicine

## 2017-03-28 NOTE — Telephone Encounter (Signed)
Spoke with [employer] and she stated that the pt has been refusing her medication on and off for about the last two weeks. Evette stated that the pt does not seem agitated she just tells them that she is not going to take her medication and to "get that out of her face". Evette stated that they just wanted to let you know that she has been refusing her medication.

## 2017-03-28 NOTE — Telephone Encounter (Signed)
I NEED MORE INFORMATION.  IS SHE AGITATED?  AT 72,  WE CANNOT FORCE A PATIENT TO TAKE MEDICATION UNLESS SHE IS COMBMATIVE

## 2017-03-28 NOTE — Telephone Encounter (Signed)
Please advise 

## 2017-03-28 NOTE — Telephone Encounter (Signed)
oK  NOTHING TO DO

## 2017-03-28 NOTE — Telephone Encounter (Signed)
Copied from [place] [id number]. Topic: Quick Communication - See Telephone Encounter >> Mar 28, 2017 10:16 AM Burnis Medin, NT wrote: CRM for notification. See Telephone encounter for: Cindy Harvey is calling to let the  doctor know that the patient is refusing to take her medication. She would like a call back at [phone]  03/28/17.

## 2017-04-18 ENCOUNTER — Ambulatory Visit: Payer: Self-pay | Admitting: *Deleted

## 2017-04-18 ENCOUNTER — Ambulatory Visit (INDEPENDENT_AMBULATORY_CARE_PROVIDER_SITE_OTHER): Payer: Medicare Other | Admitting: Internal Medicine

## 2017-04-18 ENCOUNTER — Encounter: Payer: Self-pay | Admitting: Internal Medicine

## 2017-04-18 VITALS — BP 134/64 | HR 87 | Temp 97.5°F | Ht 61.0 in | Wt 102.1 lb

## 2017-04-18 DIAGNOSIS — R040 Epistaxis: Secondary | ICD-10-CM | POA: Diagnosis not present

## 2017-04-18 NOTE — Patient Instructions (Addendum)
Please follow up with Dr. Tami Ribas tomorrow 04/19/17 be there at 9:10 for 9:40 am appt  F/u with Dr. Derrel Nip in 4-6 weeks  Nosebleed, Adult A nosebleed is when blood comes out of the nose. Nosebleeds are common. Usually, they are not a sign of a serious condition. Nosebleeds can happen if a small blood vessel in your nose starts to bleed or if the lining of your nose (mucous membrane) cracks. They are commonly caused by:  Allergies.  Colds.  Picking your nose.  Blowing your nose too hard.  An injury from sticking an object into your nose or getting hit in the nose.  Dry or cold air.  Less common causes of nosebleeds include:  Toxic fumes.  Something abnormal in the nose or in the air-filled spaces in the bones of the face (sinuses).  Growths in the nose, such as polyps.  Medicines or conditions that cause blood to clot slowly.  Certain illnesses or procedures that irritate or dry out the nasal passages.  Follow these instructions at home: When you have a nosebleed:  Sit down and tilt your head slightly forward.  Use a clean towel or tissue to pinch your nostrils under the bony part of your nose. After 10 minutes, let go of your nose and see if bleeding starts again. Do not release pressure before that time. If there is still bleeding, repeat the pinching and holding for 10 minutes until the bleeding stops.  Do not place tissues or gauze in the nose to stop bleeding.  Avoid lying down and avoid tilting your head backward. That Decou make blood collect in the throat and cause gagging or coughing.  Use a nasal spray decongestant to help with a nosebleed as told by your health care provider.  Do not use petroleum jelly or mineral oil in your nose. It can drip into your lungs. After a nosebleed:  Avoid blowing your nose or sniffing for a number of hours.  Avoid straining, lifting, or bending at the waist for several days. You Fretz resume other normal activities as you are  able.  Use saline spray or a humidifier as told by your health care provider.  Aspirinand blood thinners make bleeding more likely. If you are prescribed these medicines and you suffer from nosebleeds: ? Ask your health care provider if you should stop taking the medicines or if you should adjust the dose. ? Do not stop taking medicines that your health care provider has recommended unless told by your health care provider.  If your nosebleed was caused by dry mucous membranes, use over-the-counter saline nasal spray or gel. This will keep the mucous membranes moist and allow them to heal. If you must use a lubricant: ? Choose one that is water-soluble. ? Use only as much as you need and use it only as often as needed. ? Do not lie down until several hours after you use it. Contact a health care provider if:  You have a fever.  You get nosebleeds often or more often than usual.  You bruise very easily.  You have a nosebleed from having something stuck in your nose.  You have bleeding in your mouth.  You vomit or cough up brown material.  You have a nosebleed after you start a new medicine. Get help right away if:  You have a nosebleed after a fall or a head injury.  Your nosebleed does not go away after 20 minutes.  You feel dizzy or weak.  You  have unusual bleeding from other parts of your body.  You have unusual bruising on other parts of your body.  You become sweaty.  You vomit blood. This information is not intended to replace advice given to you by your health care provider. Make sure you discuss any questions you have with your health care provider. Document Released: 11/18/2004 Document Revised: 10/09/2015 Document Reviewed: 08/26/2015 Elsevier Interactive Patient Education  [employer].

## 2017-04-18 NOTE — Progress Notes (Addendum)
Chief Complaint  Patient presents with  . Epistaxis   F/u with daughter  Nosebleeds x 5 w/in the last week the last this am at 7:20 AM. She last had nose bleed 12 years ago. Daughter reports they have tried nasal saline but that is all. She is on Plavix only and not aspirin    Review of Systems  Constitutional: Negative for weight loss.  HENT: Positive for nosebleeds.   Respiratory: Negative for shortness of breath.   Cardiovascular: Negative for chest pain.  Skin: Negative for rash.   Past Medical History:  Diagnosis Date  . Allergy    allergic rhinitis  . Hearing loss   . History of shingles   . Hypercholesterolemia   . Hypertension   . Macular degeneration   . Osteoarthritis    right knee  . Pneumonia 2009   with dehydration and electrolyte imbalance  . Urinary incontinence    Past Surgical History:  Procedure Laterality Date  . TONSILLECTOMY  1928   Family History  Problem Relation Age of Onset  . Stroke Sister   . Heart disease Mother   . Stroke Mother   . Stroke Sister   . Cancer Maternal Aunt        breast CA  . Cancer Cousin        breast cancer  . Stroke Sister    Social History   Socioeconomic History  . Marital status: Widowed    Spouse name: Not on file  . Number of children: Not on file  . Years of education: Not on file  . Highest education level: Not on file  Social Needs  . Financial resource strain: Not on file  . Food insecurity - worry: Not on file  . Food insecurity - inability: Not on file  . Transportation needs - medical: Not on file  . Transportation needs - non-medical: Not on file  Occupational History  . Not on file  Tobacco Use  . Smoking status: Never Smoker  . Smokeless tobacco: Never Used  Substance and Sexual Activity  . Alcohol use: No  . Drug use: No  . Sexual activity: No  Other Topics Concern  . Not on file  Social History Narrative  . Not on file   Current Meds  Medication Sig  . acetaminophen (TYLENOL)  160 MG/5ML liquid Take 10.2 mLs (325 mg total) by mouth daily as needed for fever (knee pain or headache).  Marland Kitchen amLODipine (NORVASC) 5 MG tablet TAKE 1 TABLET (5 MG TOTAL) BY MOUTH DAILY.  . Calcium Carbonate-Vitamin D (CALCIUM 600+D) 600-200 MG-UNIT TABS Take 1 tablet by mouth daily.   . clopidogrel (PLAVIX) 75 MG tablet Take 1 tablet (75 mg total) by mouth daily.  Marland Kitchen ezetimibe (ZETIA) 10 MG tablet Take 1 tablet (10 mg total) by mouth daily.  . fexofenadine (ALLEGRA) 60 MG tablet Take 1 tablet (60 mg total) by mouth daily.  Marland Kitchen guaiFENesin (MUCINEX CHEST CONGESTION CHILD) 100 MG/5ML liquid Take 10 mLs (200 mg total) by mouth 3 (three) times daily as needed for cough or congestion.  . meloxicam (MOBIC) 7.5 MG tablet TAKE 1 TABLET (7.5 MG TOTAL) BY MOUTH DAILY.  . Multiple Vitamins-Minerals (EQ MULTIVITAMINS ADULT GUMMY) CHEW Chew 1 tablet by mouth 2 (two) times daily before lunch and supper.  . Multiple Vitamins-Minerals (OCUVITE PO) Take 1 tablet by mouth daily.   . niacin (NIASPAN) 500 MG CR tablet Take 2 tablets (1,000 mg total) by mouth at bedtime.  . Nutritional  Supplements (ENSURE PO) Take by mouth at bedtime.   . Omega-3 Fatty Acids (FISH OIL) 1200 MG CAPS Take 1 tablet by mouth daily.  Marland Kitchen PARoxetine (PAXIL) 10 MG tablet Take 10 mg by mouth daily.  . promethazine (PHENERGAN) 12.5 MG tablet Take 1 tablet (12.5 mg total) by mouth every 8 (eight) hours as needed for nausea or vomiting.  Marland Kitchen QUEtiapine (SEROQUEL) 25 MG tablet Take 1 tablet (25 mg total) by mouth at bedtime.   Allergies  Allergen Reactions  . Asa Buff (Mag [Buffered Aspirin] Other (See Comments)    Nose bleeds  . Lipitor [Atorvastatin Calcium] Other (See Comments)    Headaches & dizzy  . Zocor [Simvastatin - High Dose] Other (See Comments)    myalgia   No results found for this or any previous visit (from the past 2160 hour(s)). Objective  Body mass index is 19.3 kg/m. Wt Readings from Last 3 Encounters:  04/18/17 102 lb 1.9  oz (46.3 kg)  12/30/16 110 lb 12.8 oz (50.3 kg)  10/30/16 100 lb (45.4 kg)   Temp Readings from Last 3 Encounters:  04/18/17 (!) 97.5 F (36.4 C) (Oral)  10/30/16 97.6 F (36.4 C) (Oral)  10/04/16 (!) 97.2 F (36.2 C) (Oral)   BP Readings from Last 3 Encounters:  04/18/17 134/64  12/30/16 (!) 142/62  10/31/16 (!) 142/74   Pulse Readings from Last 3 Encounters:  04/18/17 87  12/30/16 80  10/31/16 71   O2 sat room air 93%  Physical Exam  Constitutional: She is oriented to person, place, and time and well-developed, well-nourished, and in no distress. Vital signs are normal.  HENT:  Head: Normocephalic and atraumatic.  Nose:    Mouth/Throat: Oropharynx is clear and moist and mucous membranes are normal. She has dentures.  Right nare with dried up blood   Eyes: Conjunctivae are normal. Pupils are equal, round, and reactive to light.  Cardiovascular: Normal rate and regular rhythm.  Murmur heard. Pulmonary/Chest: Effort normal and breath sounds normal.  Neurological: She is alert and oriented to person, place, and time. Gait normal.  Hard of hearing  bL walks with walker   Skin: Skin is warm, dry and intact.  Psychiatric: Mood, memory, affect and judgment normal.  Nursing note and vitals reviewed.   Assessment   1. Epistaxis   Plan  1. Referred to ENT Dr. Tami Ribas tomorrow appt 9:40 will fax note   Pt refusing meds since 04/17/17 x 1 week and daughter not aware advised daughter to advise home that daughter needs to be notified when she refuses medications so she can disc with pt and rec she take meds. Encouraged pt to take meds at visit today  BP elevated when not taking meds 150s-180s/70s-80s.    Provider: Dr. Olivia Mackie McLean-Scocuzza-Internal Medicine

## 2017-04-18 NOTE — Telephone Encounter (Signed)
The daughter, Cindy Harvey called in for her mother.   Her mother Cindy Harvey resides at [facility] an assisted living facility.    Her mother is on Plavix.   She has been having nosebleeds on and off for a little over a week.    This morning she came out of her room and told the CMA at the facility that her nose was bleeding and she needed help.   She had a towel under her nose.    They were able to get the bleeding stopped.   Not bleeding now. Her mother has been refusing her medications but has been on them the last few days including the Plavix.     I made her an appt for today with Dr. Terese Door at 11:00 as Dr. Derrel Nip was booked today.   Instructed her daughter to call us back or go to the ED if her mother's bleeding reoccurred.    She verbalized understanding. Reason for Disposition . Taking Coumadin (warfarin) or other strong blood thinner, or known bleeding disorder (e.g., thrombocytopenia)  Answer Assessment - Initial Assessment Questions 1. AMOUNT OF BLEEDING: "How bad is the bleeding?" "How much blood was lost?" "Has the bleeding stopped?"   - MILD: needed a couple tissues   - MODERATE: needed many tissues   - SEVERE: large blood clots, soaked many tissues, lasted more than 30 minutes      This is the 4th nose bleed in over a week.   She is on Plavix.   There was a blood clot this morning.   The bleeding was running down the back of her throat and into her mouth.   It was stained on her teeth.    The daughter is calling in. 2. ONSET: "When did the nosebleed start?"      A little over a week ago. 3. FREQUENCY: "How many nosebleeds have you had in the last 24 hours?"      This morning most recent bleed.   The CMA at the assisted living facility  She came out of her room with a towel with blood in it saying she needed help. 4. RECURRENT SYMPTOMS: "Have there been other recent nosebleeds?" If so, ask: "How long did it take you to stop the bleeding?" "What worked best?"      She had a couple of  nosebleeds in the past.   Martin Majestic to ED 12-14 years ago related to being on aspirin and took too many. 5. CAUSE: "What do you think caused this nosebleed?"     She is on Plavix. 6. LOCAL FACTORS: "Do you have any cold symptoms?", "Have you been rubbing or picking at your nose?"     Not been sick 7. SYSTEMIC FACTORS: "Do you have high blood pressure or any bleeding problems?"     Is on BP medications and it's controlled but she has been refusing her medications at times.   I had to go let her know she has to take your medications.     She's had a stroke a little over a year ago. 8. BLOOD THINNERS: "Do you take any blood thinners?" (e.g., coumadin, heparin, aspirin, Plavix)     Plavix 9. OTHER SYMPTOMS: "Do you have any other symptoms?" (e.g., lightheadedness)     No.   But she maybe would not tell us.   You have to drag information out of her. 10. PREGNANCY: "Is there any chance you are pregnant?" "When was your last menstrual period?"  N/A  Protocols used: [id number]

## 2017-04-18 NOTE — Progress Notes (Signed)
Pre visit review using our clinic review tool, if applicable. No additional management support is needed unless otherwise documented below in the visit note. 

## 2017-04-18 NOTE — Telephone Encounter (Signed)
fyi

## 2017-04-19 DIAGNOSIS — J340 Abscess, furuncle and carbuncle of nose: Secondary | ICD-10-CM | POA: Diagnosis not present

## 2017-04-19 DIAGNOSIS — R040 Epistaxis: Secondary | ICD-10-CM | POA: Diagnosis not present

## 2017-04-19 DIAGNOSIS — H6123 Impacted cerumen, bilateral: Secondary | ICD-10-CM | POA: Diagnosis not present

## 2017-04-19 DIAGNOSIS — T45515A Adverse effect of anticoagulants, initial encounter: Secondary | ICD-10-CM | POA: Diagnosis not present

## 2017-04-19 NOTE — Telephone Encounter (Signed)
fyi

## 2017-04-24 ENCOUNTER — Encounter: Payer: Self-pay | Admitting: Emergency Medicine

## 2017-04-24 ENCOUNTER — Other Ambulatory Visit: Payer: Self-pay

## 2017-04-24 ENCOUNTER — Emergency Department
Admission: EM | Admit: 2017-04-24 | Discharge: 2017-04-25 | Disposition: A | Discharge status: Home / Self Care | Payer: Medicare Other | Attending: Emergency Medicine | Admitting: Emergency Medicine

## 2017-04-24 DIAGNOSIS — Z7902 Long term (current) use of antithrombotics/antiplatelets: Secondary | ICD-10-CM | POA: Diagnosis not present

## 2017-04-24 DIAGNOSIS — F0390 Unspecified dementia without behavioral disturbance: Secondary | ICD-10-CM | POA: Diagnosis not present

## 2017-04-24 DIAGNOSIS — R040 Epistaxis: Secondary | ICD-10-CM | POA: Diagnosis not present

## 2017-04-24 DIAGNOSIS — I10 Essential (primary) hypertension: Secondary | ICD-10-CM | POA: Insufficient documentation

## 2017-04-24 DIAGNOSIS — Z8673 Personal history of transient ischemic attack (TIA), and cerebral infarction without residual deficits: Secondary | ICD-10-CM | POA: Diagnosis not present

## 2017-04-24 DIAGNOSIS — Z79899 Other long term (current) drug therapy: Secondary | ICD-10-CM | POA: Diagnosis not present

## 2017-04-24 MED ORDER — OXYMETAZOLINE HCL 0.05 % NA SOLN
1.0000 | Freq: Once | NASAL | Status: AC
  Administered 2017-04-24: 1 via NASAL
  Filled 2017-04-24: qty 15

## 2017-04-24 MED ORDER — TRANEXAMIC ACID 1000 MG/10ML IV SOLN
500.0000 mg | Freq: Once | INTRAVENOUS | Status: AC
  Administered 2017-04-24: 500 mg via TOPICAL
  Filled 2017-04-24: qty 10

## 2017-04-24 NOTE — ED Notes (Addendum)
Nose clamp tightened bleeding abated, 2 large clots removed by techs at arrival to room, daughter describes cauterization Tuesday after pt had been refusing meds at SNF, HTN uncontrolled causes epistaxis hx  Mouth swabs given pt cleaned

## 2017-04-24 NOTE — ED Notes (Signed)
Pharm called for meds  

## 2017-04-24 NOTE — ED Triage Notes (Signed)
Pt brought POV from [facility] by daughter; pt has had intermittent nose bleeds over the last week or so; was seen by Dr Tami Ribas and right nostril was cauterized twice; tonight pt was asleep when nosebleed started again; clip in place but not helping; pt is on Plavix

## 2017-04-25 ENCOUNTER — Encounter: Payer: Self-pay | Admitting: *Deleted

## 2017-04-25 ENCOUNTER — Telehealth: Payer: Self-pay | Admitting: Internal Medicine

## 2017-04-25 DIAGNOSIS — R040 Epistaxis: Secondary | ICD-10-CM | POA: Diagnosis not present

## 2017-04-25 MED ORDER — LIDOCAINE VISCOUS 2 % MT SOLN
OROMUCOSAL | Status: AC
  Filled 2017-04-25: qty 15

## 2017-04-25 MED ORDER — CEPHALEXIN 500 MG PO CAPS
500.0000 mg | ORAL_CAPSULE | Freq: Once | ORAL | Status: AC
Start: 2017-04-25 — End: 2017-04-25
  Administered 2017-04-25: 500 mg via ORAL
  Filled 2017-04-25: qty 1

## 2017-04-25 MED ORDER — CEPHALEXIN 500 MG PO CAPS
ORAL_CAPSULE | ORAL | Status: AC
  Filled 2017-04-25: qty 1

## 2017-04-25 MED ORDER — CEPHALEXIN 500 MG PO CAPS: 500.0000 mg | ORAL_CAPSULE | Freq: Two times a day (BID) | ORAL | 0 refills | Status: AC

## 2017-04-25 NOTE — Telephone Encounter (Signed)
Patient ED and notes from ed agree to DC clopidogrel I have pasted from ER note.    I also discussed with Dr. Tami Ribas and the patient's daughter the role of Plavix.  The daughter understands the risks/benefits of coming off and we all agree she would be better off of it, at least in the setting of acute bleeding.  I asked her to discontinue for now and follow up with Dr. Derrel Nip (PCP) to discuss.  She was started on it after a CVA more than a year ago by the hospitalists, not by her PCP.

## 2017-04-25 NOTE — Telephone Encounter (Signed)
Yes I agree ms Tonia Ghent should NOT  take any more plavix

## 2017-04-25 NOTE — ED Provider Notes (Signed)
[facility] Emergency Department Provider Note  ____________________________________________   First MD Initiated Contact with Patient 04/24/17 2315     (approximate)  I have reviewed the triage vital signs and the nursing notes.   HISTORY  Chief Complaint Epistaxis  The patient is severely hard of hearing and most likely has some age-related dementia according to the daughter which limits her ability to provide a history or review of systems.  History is provided by daughter.  HPI Cindy Harvey is a 84 y.o. female with medical history as listed below which includes a prior CVA for which she was started on Plavix and a recent history of nosebleeds for which she saw Dr. Tami Ribas about 5 days ago in clinic and he cauterized 2 spots in her right naris.  She presents tonight for acute onset of rebleeding from the right nose that is severe and copious bright red blood.  Her daughter reports that she was able to get it to stop bleeding by applying some ice to the side of her nose at [facility] when it started bleeding this afternoon, but it started up again tonight while the patient was asleep.  Nothing else has helped and nothing in particular makes it worse.  The patient is choking a little bit on the blood that is running down her throat but she has no other complaints.  Of note, the daughter is interested in her coming off of the Plavix.  It was started by the hospitalist after her admission for CVA, not started by her primary care provider, and her daughter understands the risk benefits of a 84 year old being on Plavix particularly with her continuing to have nosebleeds..  Past Medical History:  Diagnosis Date  . Allergy    allergic rhinitis  . Hearing loss   . History of shingles   . Hypercholesterolemia   . Hypertension   . Macular degeneration   . Osteoarthritis    right knee  . Pneumonia 2009   with dehydration and electrolyte imbalance  . Urinary  incontinence     Patient Active Problem List   Diagnosis Date Noted  . Epistaxis 04/18/2017  . Hospital discharge follow-up 03/06/2016  . CVA (cerebral vascular accident) ([place]) 02/25/2016  . Medicare annual wellness visit, subsequent 08/23/2015  . 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 Generalized anxiety disorder 12/04/2013  . History of fall 09/28/2013  . DNR (do not resuscitate) discussion 09/26/2013  . Hyponatremia 09/26/2013  . Chronic renal insufficiency, stage III (moderate) ([place]) 01/31/2013  . Heart murmur 07/21/2011  . Post-menopausal 07/14/2010  . Osteoporosis screening 07/14/2010  . Hordeolum 07/07/2010  . Depression (emotion) 05/21/2010  . HLD (hyperlipidemia) 10/24/2008  . Essential hypertension 10/24/2008  . ALLERGIC RHINITIS 10/24/2008  . Osteoarthrosis involving lower leg 10/24/2008    Past Surgical History:  Procedure Laterality Date  . TONSILLECTOMY  1928    Prior to Admission medications   Medication Sig Start Date End Date Taking? Authorizing Provider  acetaminophen (TYLENOL) 160 MG/5ML liquid Take 10.2 mLs (325 mg total) by mouth daily as needed for fever (knee pain or headache). 03/03/16   Crecencio Mc, MD  amLODipine (NORVASC) 5 MG tablet TAKE 1 TABLET (5 MG TOTAL) BY MOUTH DAILY. 01/27/15   Darlin Coco, MD  Calcium Carbonate-Vitamin D (CALCIUM 600+D) 600-200 MG-UNIT TABS Take 1 tablet by mouth daily.     [provider]  cephALEXin (KEFLEX) 500 MG capsule Take 1 capsule (500 mg total) by mouth 2 (two)  times daily for 5 days. 04/25/17 04/30/17  Hinda Kehr, MD  ezetimibe (ZETIA) 10 MG tablet Take 1 tablet (10 mg total) by mouth daily. 09/04/15   Nahser, Wonda Cheng, MD  fexofenadine (ALLEGRA) 60 MG tablet Take 1 tablet (60 mg total) by mouth daily. 03/03/16 10/01/21  Crecencio Mc, MD  guaiFENesin [facility] CHEST CONGESTION CHILD) 100 MG/5ML liquid Take 10 mLs (200 mg total) by mouth 3 (three) times daily as needed for cough or congestion.  03/03/16   Crecencio Mc, MD  meloxicam (MOBIC) 7.5 MG tablet TAKE 1 TABLET (7.5 MG TOTAL) BY MOUTH DAILY. 11/14/15   Crecencio Mc, MD  Multiple Vitamins-Minerals (EQ MULTIVITAMINS ADULT GUMMY) CHEW Chew 1 tablet by mouth 2 (two) times daily before lunch and supper. 03/03/16   Crecencio Mc, MD  Multiple Vitamins-Minerals (OCUVITE PO) Take 1 tablet by mouth daily.     [provider]  niacin (NIASPAN) 500 MG CR tablet Take 2 tablets (1,000 mg total) by mouth at bedtime. 02/12/11   Darlin Coco, MD  Nutritional Supplements (ENSURE PO) Take by mouth at bedtime.     [provider]  Omega-3 Fatty Acids (FISH OIL) 1200 MG CAPS Take 1 tablet by mouth daily.    [provider]  PARoxetine (PAXIL) 10 MG tablet Take 10 mg by mouth daily.    [provider]  promethazine (PHENERGAN) 12.5 MG tablet Take 1 tablet (12.5 mg total) by mouth every 8 (eight) hours as needed for nausea or vomiting. 03/03/16   Crecencio Mc, MD  QUEtiapine (SEROQUEL) 25 MG tablet Take 1 tablet (25 mg total) by mouth at bedtime. 09/10/16   Crecencio Mc, MD    Allergies Asa buff (mag [buffered aspirin]; Lipitor [atorvastatin calcium]; and Zocor [simvastatin - high dose]  Family History  Problem Relation Age of Onset  . Stroke Sister   . Heart disease Mother   . Stroke Mother   . Stroke Sister   . Cancer Maternal Aunt        breast CA  . Cancer Cousin        breast cancer  . Stroke Sister     Social History Social History   Tobacco Use  . Smoking status: Never Smoker  . Smokeless tobacco: Never Used  Substance Use Topics  . Alcohol use: No  . Drug use: No    Review of Systems The patient is severely hard of hearing and most likely has some age-related dementia according to the daughter which limits her ability to provide a history or review of systems.  History is provided by daughter.  Constitutional: No fever/chills ENT: Right -sided epistaxis, recurrent, as  described above Cardiovascular: Denies chest pain. Respiratory: Denies shortness of breath. Gastrointestinal: No abdominal pain.  No nausea, no vomiting.     ____________________________________________   PHYSICAL EXAM:  VITAL SIGNS: ED Triage Vitals  Enc Vitals Group     BP 04/24/17 2235 (!) 129/56     Pulse Rate 04/24/17 2235 87     Resp 04/24/17 2235 20     Temp 04/24/17 2235 97.7 F (36.5 C)     Temp Source 04/24/17 2235 Axillary     SpO2 04/24/17 2235 98 %     Weight 04/24/17 2232 46.3 kg (102 lb)     Height 04/24/17 2232 1.549 m (5\' 1" )     Head Circumference --      Peak Flow --      Pain Score --  Pain Loc --      Pain Edu? --      Excl. in [place]? --     Constitutional: Elderly, no acute distress, but obvious nosebleed Eyes: Conjunctivae are normal.  Head: Atraumatic. Nose: Brisk right-sided epistaxis.  Unable to visualize any specific location due to the amount of active bleeding.  No bleeding from left naris. Mouth/Throat: Mucous membranes are moist. Copious fresh and dried blood in oropharynx from the epistaxis. Neck: No stridor.  No meningeal signs.   Cardiovascular: Normal rate, regular rhythm. Good peripheral circulation. Grossly normal heart sounds. Respiratory: Normal respiratory effort.  No retractions.  Musculoskeletal: No lower extremity tenderness nor edema. No gross deformities of extremities. Neurologic:  Severely hard of hearing, but no acute deficits are appreciated.  She is moving all four extremities without difficulty. Skin:  Skin is warm, dry and intact. No rash noted.  ____________________________________________   LABS (all labs ordered are listed, but only abnormal results are displayed)  Labs Reviewed - No data to display ____________________________________________  EKG  None - EKG not ordered by ED physician ____________________________________________  RADIOLOGY   ED MD interpretation: No imaging indicated  Official  radiology report(s): No results found.  ____________________________________________   PROCEDURES  Critical Care performed: No   Procedure(s) performed:   .Epistaxis Management Date/Time: 04/25/2017 12:24 AM Performed by: Hinda Kehr, MD Authorized by: Hinda Kehr, MD   Consent:    Consent obtained:  Verbal   Consent given by:  Guardian   Risks discussed:  Bleeding, infection, nasal injury and pain Procedure details:    Treatment site:  R anterior   Treatment method:  Anterior pack (TXA on 2x2 gauze)   Treatment complexity:  Limited   Treatment episode: initial   Post-procedure details:    Assessment:  Bleeding decreased   Patient tolerance of procedure:  Tolerated well, no immediate complications  .Epistaxis Management Date/Time: 04/25/2017 2:58 AM Performed by: Hinda Kehr, MD Authorized by: Hinda Kehr, MD   Consent:    Consent obtained:  Verbal   Consent given by:  Guardian Procedure details:    Treatment site:  R anterior   Treatment method:  Merocel sponge   Treatment complexity:  Limited   Treatment episode: recurring   Post-procedure details:    Assessment:  Bleeding stopped   Patient tolerance of procedure:  Tolerated well, no immediate complications     ____________________________________________   INITIAL IMPRESSION / ASSESSMENT AND PLAN / ED COURSE  As part of my medical decision making, I reviewed the following data within the [place] History obtained from family, Nursing notes reviewed and incorporated, Old chart reviewed, A phone consult was requested and obtained from this/these consultant(s) ENT and Notes from prior ED visits    The patient is having recurring bleeding from her right naris in the setting of cauterization last week and she is on Plavix.  I attempted to treat with TXA soaked gauze as described above but it continued to ooze blood.  Given the strong probability of recurrent bleeding, I called Dr.  Tami Ribas and asked whether he wanted to see her in clinic in a few hours or whether I should go ahead and place a Merocel sponge.  He said he would place the Merocel anyway so I went ahead and placed it after talking with the daughter.  As per Dr. Ileene Hutchinson recommendations I started her on Keflex 500 mg twice daily and the patient will follow up in ENT clinic in 4-5 days.  I gave my usual and customary return precautions. Daughter agrees with the plan.  I also discussed with Dr. Tami Ribas and the patient's daughter the role of Plavix.  The daughter understands the risks/benefits of coming off and we all agree she would be better off of it, at least in the setting of acute bleeding.  I asked her to discontinue for now and follow up with Dr. Derrel Nip (PCP) to discuss.  She was started on it after a CVA more than a year ago by the hospitalists, not by her PCP.    ____________________________________________  FINAL CLINICAL IMPRESSION(S) / ED DIAGNOSES  Final diagnoses:  Right-sided epistaxis     MEDICATIONS GIVEN DURING THIS VISIT:  Medications  lidocaine (XYLOCAINE) 2 % viscous mouth solution (not administered)  tranexamic acid (CYKLOKAPRON) injection 500 mg (500 mg Topical Given 04/24/17 2351)  oxymetazoline (AFRIN) 0.05 % nasal spray 1 spray (1 spray Each Nare Given 04/24/17 2350)  cephALEXin (KEFLEX) capsule 500 mg (500 mg Oral Given 04/25/17 0232)     ED Discharge Orders        Ordered    cephALEXin (KEFLEX) 500 MG capsule  2 times daily     04/25/17 0152       Note:  This document was prepared using Dragon voice recognition software and [place] include unintentional dictation errors.    Hinda Kehr, MD 04/25/17 [phone]

## 2017-04-25 NOTE — Telephone Encounter (Signed)
Daughter called to follow up on this message. Pt has pulled out her nose packing.  The hospital dr put her on the clopidogrel (PLAVIX) tablet 75 mg   Daughter wants to know if Dr Derrel Nip is in agreement to take the pt off this med. Pt is in assisted living and they are giving pt this med.  Dr states this will probably continue if pt stays on this med.  [phone]

## 2017-04-25 NOTE — Telephone Encounter (Signed)
Copied from [place]. Topic: Appointment Scheduling - Prior Auth Required for Appointment >> Apr 25, 2017  9:46 AM Cindy Harvey wrote: Patient needs a hospital f/u for nose bleeding, was seen in ER last night. Please call Daughter at [phone]  >> Apr 25, 2017 11:11 AM [place], Eddie North wrote: Telephone message was already made too

## 2017-04-25 NOTE — Telephone Encounter (Signed)
I have printed DC order for Cindy Harvey, patient is following up with  ENT for ER follow up and patient has appointment with PCP 05/31/17 does she need sooner appointment?

## 2017-04-25 NOTE — Telephone Encounter (Signed)
Copied from [place]. Topic: Quick Communication - See Telephone Encounter >> Apr 25, 2017  9:43 AM Ahmed Prima L wrote: CRM for notification. See Telephone encounter for:   04/25/17.   Pt's daughter Thornton Dales) called and said that she was in the ER last night with her mom for nose bleeding (8 times). The ER doctor wanted her to call & tell Dr Derrel Nip to d/c her Clopidogrel. She would like Dr. Lupita Dawn nurse to call her at [phone]. Patient seen Dr. Aundra Dubin last Monday.

## 2017-04-25 NOTE — Discharge Instructions (Signed)
We placed a special sponge in Cindy Harvey's nose which needs to stay in place for about 4 days.  Please call [facility] in the morning to schedule a follow up visit on Thursday or Friday (as per Dr. Ileene Hutchinson advice).  If she pulls out the sponge before then, please call the ENT clinic.  Have her take the prescribed antibiotics (Keflex) as written.  We recommend that she stop taking Plavix, and we have sent a note to Dr. Derrel Nip in this regard as well.  Return to the emergency department if you develop new or worsening symptoms that concern you.

## 2017-04-25 NOTE — Telephone Encounter (Signed)
NO,  SHE DOES NOT NEED TO SEE ME IF SHE IS SEEING ENT

## 2017-04-26 NOTE — Telephone Encounter (Signed)
Daughter notified 

## 2017-04-29 ENCOUNTER — Other Ambulatory Visit: Payer: Self-pay

## 2017-04-29 ENCOUNTER — Telehealth: Payer: Self-pay | Admitting: Internal Medicine

## 2017-04-29 ENCOUNTER — Emergency Department (HOSPITAL_COMMUNITY)
Admission: EM | Admit: 2017-04-29 | Discharge: 2017-04-30 | Disposition: A | Discharge status: Home / Self Care | Payer: Medicare Other | Attending: Emergency Medicine | Admitting: Emergency Medicine

## 2017-04-29 ENCOUNTER — Encounter (HOSPITAL_COMMUNITY): Payer: Self-pay | Admitting: Emergency Medicine

## 2017-04-29 DIAGNOSIS — F411 Generalized anxiety disorder: Secondary | ICD-10-CM | POA: Diagnosis not present

## 2017-04-29 DIAGNOSIS — D649 Anemia, unspecified: Secondary | ICD-10-CM

## 2017-04-29 DIAGNOSIS — F329 Major depressive disorder, single episode, unspecified: Secondary | ICD-10-CM | POA: Insufficient documentation

## 2017-04-29 DIAGNOSIS — Z8673 Personal history of transient ischemic attack (TIA), and cerebral infarction without residual deficits: Secondary | ICD-10-CM | POA: Diagnosis not present

## 2017-04-29 DIAGNOSIS — Z79899 Other long term (current) drug therapy: Secondary | ICD-10-CM | POA: Diagnosis not present

## 2017-04-29 DIAGNOSIS — Z66 Do not resuscitate: Secondary | ICD-10-CM | POA: Diagnosis not present

## 2017-04-29 DIAGNOSIS — N184 Chronic kidney disease, stage 4 (severe): Secondary | ICD-10-CM | POA: Diagnosis not present

## 2017-04-29 DIAGNOSIS — J340 Abscess, furuncle and carbuncle of nose: Secondary | ICD-10-CM | POA: Diagnosis not present

## 2017-04-29 DIAGNOSIS — F0390 Unspecified dementia without behavioral disturbance: Secondary | ICD-10-CM | POA: Insufficient documentation

## 2017-04-29 DIAGNOSIS — I129 Hypertensive chronic kidney disease with stage 1 through stage 4 chronic kidney disease, or unspecified chronic kidney disease: Secondary | ICD-10-CM | POA: Insufficient documentation

## 2017-04-29 DIAGNOSIS — R040 Epistaxis: Secondary | ICD-10-CM | POA: Diagnosis not present

## 2017-04-29 LAB — CBC
HEMATOCRIT: 23.5 % — AB (ref 36.0–46.0)
HEMOGLOBIN: 7.5 g/dL — AB (ref 12.0–15.0)
MCH: 30 pg (ref 26.0–34.0)
MCHC: 31.9 g/dL (ref 30.0–36.0)
MCV: 94 fL (ref 78.0–100.0)
Platelets: 206 10*3/uL (ref 150–400)
RBC: 2.5 MIL/uL — AB (ref 3.87–5.11)
RDW: 16.7 % — ABNORMAL HIGH (ref 11.5–15.5)
WBC: 8.4 10*3/uL (ref 4.0–10.5)

## 2017-04-29 LAB — I-STAT CHEM 8, ED
BUN: 41 mg/dL — ABNORMAL HIGH (ref 6–20)
CREATININE: 3.5 mg/dL — AB (ref 0.44–1.00)
Calcium, Ion: 1.28 mmol/L (ref 1.15–1.40)
Chloride: 107 mmol/L (ref 101–111)
Glucose, Bld: 125 mg/dL — ABNORMAL HIGH (ref 65–99)
HEMATOCRIT: 23 % — AB (ref 36.0–46.0)
HEMOGLOBIN: 7.8 g/dL — AB (ref 12.0–15.0)
POTASSIUM: 4.8 mmol/L (ref 3.5–5.1)
Sodium: 136 mmol/L (ref 135–145)
TCO2: 19 mmol/L — ABNORMAL LOW (ref 22–32)

## 2017-04-29 LAB — BASIC METABOLIC PANEL
ANION GAP: 12 (ref 5–15)
BUN: 42 mg/dL — ABNORMAL HIGH (ref 6–20)
CHLORIDE: 106 mmol/L (ref 101–111)
CO2: 18 mmol/L — AB (ref 22–32)
Calcium: 9.6 mg/dL (ref 8.9–10.3)
Creatinine, Ser: 3.28 mg/dL — ABNORMAL HIGH (ref 0.44–1.00)
GFR calc non Af Amer: 11 mL/min — ABNORMAL LOW (ref >60)
GFR, EST AFRICAN AMERICAN: 13 mL/min — AB (ref >60)
Glucose, Bld: 133 mg/dL — ABNORMAL HIGH (ref 65–99)
POTASSIUM: 4.8 mmol/L (ref 3.5–5.1)
Sodium: 136 mmol/L (ref 135–145)

## 2017-04-29 LAB — POC OCCULT BLOOD, ED: Fecal Occult Bld: POSITIVE — AB

## 2017-04-29 NOTE — ED Notes (Signed)
ED Provider at bedside. 

## 2017-04-29 NOTE — ED Triage Notes (Signed)
Patient presents to the ED from doctor's office (ENT) after being seen for recurrent nose bleeds for about a week. No nosebleed today but was seen at [facility] Sunday and was resolved. Hematocrit and hemoglobin were low and were sent to the ED per ENT today.

## 2017-04-29 NOTE — ED Notes (Signed)
Pt is from Assisted living [facility] in [place]

## 2017-04-29 NOTE — Telephone Encounter (Signed)
Patient's hemoglobin received at 4:55 from Dr Ileene Hutchinson office where she was being seen for persistent nosebleed.  hgb was 7.3  ..  She needs to be transfused.  There is no way to do this over the weekend except at the ER .  If she is feeling weak, short of breath,  Or dizzy,  She should be taken to the ER for treatment with blood transfusion

## 2017-04-29 NOTE — Telephone Encounter (Signed)
Spoke with patient daughter and she is taking her mother to [place] . Called [facility] notified charge nurse in ED and faxed copy of labs.

## 2017-04-29 NOTE — ED Provider Notes (Signed)
[facility] EMERGENCY DEPARTMENT Provider Note   CSN: [id number] Arrival date & time: 04/29/17  1805     History   Chief Complaint Chief Complaint  Patient presents with  . Abnormal Lab    HPI Cindy Harvey is a 84 y.o. female.  HPI   Had nose bleed 3/3 was seen in ED, had merocel packing placed, seen by ENT. Had hgb show 7.3 and was sent here by ENT for possible transfusion.  Family reports that she has had bleeding from the right nares over the last 2 weeks.  Note from the emergency department reports that it was an anterior nosebleed that was previously treated with cauterization, and then treated with Merocel packing.  This was in the setting of patient taking Plavix, and missing some doses of her blood pressure medications.  They went to go see the ear nose and throat doctor today for follow-up, and daughter was concerned that patient had appeared more pale, and had wanted to have her blood checked given the persistence of nosebleed she had over the last 2 weeks.  Her hemoglobin was checked and found to be low with ENT and she was sent to the emergency department for possible transfusion and further care.  Patient is hard of hearing, and per her daughter, does not and has not ever complained about anything.  At this time the patient also denies any chest pain, shortness of breath, lightheadedness, dizziness.  Daughter also acknowledges that her mother has not appeared short of breath, dizzy, and has not had any syncopal episodes.  She acknowledges that there is a point when you begin to focus more on quality rather than quantity of life, and has paperwork indicating patient is DNR. She has a history of CKD.  Past Medical History:  Diagnosis Date  . Allergy    allergic rhinitis  . Hearing loss   . History of shingles   . Hypercholesterolemia   . Hypertension   . Macular degeneration   . Osteoarthritis    right knee  . Pneumonia 2009   with dehydration and  electrolyte imbalance  . Urinary incontinence     Patient Active Problem List   Diagnosis Date Noted  . Epistaxis 04/18/2017  . Hospital discharge follow-up 03/06/2016  . CVA (cerebral vascular accident) ([place]) 02/25/2016  . Medicare annual wellness visit, subsequent 08/23/2015  . 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 Generalized anxiety disorder 12/04/2013  . History of fall 09/28/2013  . DNR (do not resuscitate) discussion 09/26/2013  . Hyponatremia 09/26/2013  . Chronic renal insufficiency, stage III (moderate) ([place]) 01/31/2013  . Heart murmur 07/21/2011  . Post-menopausal 07/14/2010  . Osteoporosis screening 07/14/2010  . Hordeolum 07/07/2010  . Depression (emotion) 05/21/2010  . HLD (hyperlipidemia) 10/24/2008  . Essential hypertension 10/24/2008  . ALLERGIC RHINITIS 10/24/2008  . Osteoarthrosis involving lower leg 10/24/2008    Past Surgical History:  Procedure Laterality Date  . TONSILLECTOMY  1928    OB History    No data available       Home Medications    Prior to Admission medications   Medication Sig Start Date End Date Taking? Authorizing Provider  acetaminophen (TYLENOL) 160 MG/5ML liquid Take 10.2 mLs (325 mg total) by mouth daily as needed for fever (knee pain or headache). 03/03/16  Yes Crecencio Mc, MD  acetaminophen (TYLENOL) 325 MG tablet Take 650 mg by mouth every 4 (four) hours as needed for fever (pain).   Yes [provider]  amLODipine (NORVASC) 5 MG tablet TAKE 1 TABLET (5 MG TOTAL) BY MOUTH DAILY. 01/27/15  Yes Darlin Coco, MD  cephALEXin (KEFLEX) 500 MG capsule Take 1 capsule (500 mg total) by mouth 2 (two) times daily for 5 days. Patient taking differently: Take 500 mg by mouth 2 (two) times daily. 5 day course started 04/26/17 04/25/17 04/30/17 Yes Hinda Kehr, MD  ezetimibe (ZETIA) 10 MG tablet Take 1 tablet (10 mg total) by mouth daily. 09/04/15  Yes Nahser, Wonda Cheng, MD  fexofenadine (ALLEGRA) 60 MG tablet Take 1 tablet  (60 mg total) by mouth daily. 03/03/16 10/01/21 Yes Crecencio Mc, MD  guaiFENesin [facility] CHEST CONGESTION CHILD) 100 MG/5ML liquid Take 10 mLs (200 mg total) by mouth 3 (three) times daily as needed for cough or congestion. 03/03/16  Yes Crecencio Mc, MD  loperamide (IMODIUM A-D) 2 MG tablet Take 2 mg by mouth 3 (three) times daily as needed for diarrhea or loose stools.   Yes [provider]  meloxicam (MOBIC) 7.5 MG tablet TAKE 1 TABLET (7.5 MG TOTAL) BY MOUTH DAILY. 11/14/15  Yes Crecencio Mc, MD  Multiple Vitamins-Minerals (EQ MULTIVITAMINS ADULT GUMMY) CHEW Chew 1 tablet by mouth 2 (two) times daily before lunch and supper. 03/03/16  Yes Crecencio Mc, MD  multivitamin-lutein [facility]) CAPS capsule Take 1 capsule by mouth daily.   Yes [provider]  niacin (NIASPAN) 500 MG CR tablet Take 2 tablets (1,000 mg total) by mouth at bedtime. 02/12/11  Yes Darlin Coco, MD  Omega-3 Fatty Acids (OMEGA-3 FISH OIL PO) Take 5 mLs by mouth daily.   Yes [provider]  PARoxetine (PAXIL) 10 MG tablet Take 10 mg by mouth daily.   Yes [provider]  promethazine (PHENERGAN) 12.5 MG tablet Take 1 tablet (12.5 mg total) by mouth every 8 (eight) hours as needed for nausea or vomiting. 03/03/16  Yes Crecencio Mc, MD  QUEtiapine (SEROQUEL) 25 MG tablet Take 1 tablet (25 mg total) by mouth at bedtime. Patient taking differently: Take 12.5 mg by mouth at bedtime.  09/10/16  Yes Crecencio Mc, MD    Family History Family History  Problem Relation Age of Onset  . Stroke Sister   . Heart disease Mother   . Stroke Mother   . Stroke Sister   . Cancer Maternal Aunt        breast CA  . Cancer Cousin        breast cancer  . Stroke Sister     Social History Social History   Tobacco Use  . Smoking status: Never Smoker  . Smokeless tobacco: Never Used  Substance Use Topics  . Alcohol use: No  . Drug use: No     Allergies   Asa buff (mag  [buffered aspirin]; Lipitor [atorvastatin calcium]; and Zocor [simvastatin - high dose]   Review of Systems Review of Systems  Unable to perform ROS: Dementia  Constitutional: Negative for fatigue (patient denies) and fever.  HENT: Negative for sore throat.   Eyes: Negative for visual disturbance.  Respiratory: Negative for cough and shortness of breath.   Cardiovascular: Negative for chest pain.  Gastrointestinal: Negative for abdominal pain, nausea and vomiting.  Genitourinary: Negative for difficulty urinating.  Musculoskeletal: Negative for back pain and neck pain.  Skin: Negative for rash.  Neurological: Negative for syncope, light-headedness and headaches.     Physical Exam Updated Vital Signs BP (!) 161/61   Pulse 98   Temp 97.6 F (  36.4 C) (Oral)   Resp 16   Ht 5\' 1"  (1.549 m)   Wt 45.8 kg (101 lb)   SpO2 97%   BMI 19.08 kg/m   Physical Exam  Constitutional: She is oriented to person, place, and time. She appears well-developed and well-nourished. No distress.  HENT:  Head: Normocephalic and atraumatic.  Eyes: Conjunctivae and EOM are normal.  Neck: Normal range of motion.  Cardiovascular: Normal rate, regular rhythm, normal heart sounds and intact distal pulses. Exam reveals no gallop and no friction rub.  No murmur heard. Pulmonary/Chest: Effort normal and breath sounds normal. No respiratory distress. She has no wheezes. She has no rales.  Abdominal: Soft. She exhibits no distension. There is no tenderness. There is no guarding.  Genitourinary:  Genitourinary Comments: Hemorrhoids Light brown colored stool  Musculoskeletal: She exhibits no edema or tenderness.  Neurological: She is alert and oriented to person, place, and time.  Skin: Skin is warm and dry. No rash noted. She is not diaphoretic. No erythema.  Nursing note and vitals reviewed.    ED Treatments / Results  Labs (all labs ordered are listed, but only abnormal results are displayed) Labs  Reviewed  CBC - Abnormal; Notable for the following components:      Result Value   RBC 2.50 (*)    Hemoglobin 7.5 (*)    HCT 23.5 (*)    RDW 16.7 (*)    All other components within normal limits  BASIC METABOLIC PANEL - Abnormal; Notable for the following components:   CO2 18 (*)    Glucose, Bld 133 (*)    BUN 42 (*)    Creatinine, Ser 3.28 (*)    GFR calc non Af Amer 11 (*)    GFR calc Af Amer 13 (*)    All other components within normal limits  I-STAT CHEM 8, ED - Abnormal; Notable for the following components:   BUN 41 (*)    Creatinine, Ser 3.50 (*)    Glucose, Bld 125 (*)    TCO2 19 (*)    Hemoglobin 7.8 (*)    HCT 23.0 (*)    All other components within normal limits  POC OCCULT BLOOD, ED - Abnormal; Notable for the following components:   Fecal Occult Bld POSITIVE (*)    All other components within normal limits    EKG  EKG Interpretation None       Radiology No results found.  Procedures Procedures (including critical care time)  Medications Ordered in ED Medications - No data to display   Initial Impression / Assessment and Plan / ED Course  I have reviewed the triage vital signs and the nursing notes.  Pertinent labs & imaging results that were available during my care of the patient were reviewed by me and considered in my medical decision making (see chart for details).     84 year old female with history above presents with concern for anemia found on labs done at the ear nose and throat physician's office.  Patient hemodynamically stable in the emergency department, denies any symptoms, and family acknowledges that patient has not had any syncope, or appeared to be short of breath, and also reports that the patient has not reported any other symptoms, but that she is not a complainer and would not.    History of nosebleed from what I can tell from the prior notes indicate that this was likely an anterior nosebleed that have been more persistent.   She currently has  packing present in the right nares, which appears dry at this time, and has no sign of other continued bleeding.  Patient's hemoglobin is noted to be trending down since last year.  Her anemia [place] be multifactorial, and is likely contributed to by her worsening chronic kidney disease with anemia of chronic disease, age, as well as nosebleed as a possible contributor.  Performed rectal exam which was Hemoccult positive, but showed normal colored stool, no sign of acute GI hemorrhage.  Suspect fecal occult blood was positive due to either patient's epistaxis, or hemorrhoids, or other, but do not suspect acute GI bleed.  Discussed options with family.  Discussed that typically we will plan for transfusion when hemoglobin is less than 7, and less there are significant symptoms, or patient has symptomatic anemia.  Patient has normal vital signs, no sign of continued bleeding, no sign of symptomatic anemia, and also discussed that it is reasonable for patient to have her hemoglobin creatinine rechecked on Monday given this clinical scenario.  Discussed with patient's family and hospitalist on-call, and agree that given patient's hemoglobin is above 7, she is not have symptoms or vital sign abnormalities, and has no sign of continued bleeding that she is stable for outpatient evaluation.  Discussed that she [place] at some point have hemoglobin low enough that we would recommend transfusion, but at this point we would not recommend it given the clinical scenario.  Also note that she has worsening of her chronic kidney disease, and that her creatinine should also be rechecked on Monday.  Family states understanding.  Discussed return precautions with them in detail.  Patient smiling and happy to be leaving the ED. Patient discharged in stable condition with understanding of reasons to return.   Final Clinical Impressions(s) / ED Diagnoses   Final diagnoses:  Stage 4 chronic kidney disease ([place])    Anemia, unspecified type    ED Discharge Orders    None       Gareth Morgan, MD 04/30/17 [phone]

## 2017-04-30 ENCOUNTER — Other Ambulatory Visit: Payer: Self-pay | Admitting: Internal Medicine

## 2017-04-30 ENCOUNTER — Telehealth: Payer: Self-pay | Admitting: Internal Medicine

## 2017-04-30 DIAGNOSIS — F0392 Unspecified dementia, unspecified severity, with psychotic disturbance: Secondary | ICD-10-CM

## 2017-04-30 DIAGNOSIS — D631 Anemia in chronic kidney disease: Secondary | ICD-10-CM

## 2017-04-30 DIAGNOSIS — Z7189 Other specified counseling: Secondary | ICD-10-CM

## 2017-04-30 DIAGNOSIS — D649 Anemia, unspecified: Secondary | ICD-10-CM

## 2017-04-30 DIAGNOSIS — D6489 Other specified anemias: Secondary | ICD-10-CM

## 2017-04-30 DIAGNOSIS — N184 Chronic kidney disease, stage 4 (severe): Secondary | ICD-10-CM

## 2017-04-30 DIAGNOSIS — N183 Chronic kidney disease, stage 3 unspecified: Secondary | ICD-10-CM

## 2017-04-30 DIAGNOSIS — F0391 Unspecified dementia with behavioral disturbance: Secondary | ICD-10-CM

## 2017-04-30 MED ORDER — FERROUS SULFATE 325 (65 FE) MG PO TABS: ORAL_TABLET | ORAL | 3 refills | Status: DC

## 2017-04-30 NOTE — Progress Notes (Signed)
ER records reviewed. No transfusion after lengthy discussion with daughter given patient's lack of dyspnea,  Age and chronicity.  I recommend that she start taking  iron supplements once daily  With a glass of orange juice ( to increase absorption) , recheck cr and iron sometime this week if patient and daughter is agreeable and consider Hospice referral if kidney function is truly as low as suggested by ER labs(GFR < 15).   rx for ferrous sulfate will need to be sent to  Whatever pharmacy daughter wants  it sent to

## 2017-04-30 NOTE — Telephone Encounter (Signed)
ER records reviewed. No transfusion was done, after lengthy discussion with daughter given patient's lack of dyspnea,  Advanced Age and chronicity of anemia .  I recommend that she start taking  iron supplements once daily  With a glass of orange juice ( to increase absorption) , recheck cr and iron sometime this week if patient and daughter is agreeable and consider Hospice referral if kidney function is truly as low as suggested by ER labs(GFR < 15).   rx for ferrous sulfate will need to be sent to  Whatever pharmacy daughter wants  it sent to

## 2017-05-02 ENCOUNTER — Telehealth: Payer: Self-pay | Admitting: *Deleted

## 2017-05-02 NOTE — Telephone Encounter (Signed)
Copied from [place] [id number]. Topic: General - Other >> May 02, 2017  4:14 PM Conception Chancy, NT wrote: Neoma Laming is calling from [facility] and states she received a fax from the office in regards to this patient. She states she is currently not a patient of theres and was unaware if it was sent over in error.   Neoma Laming Fax: [phone] Cb# [phone]

## 2017-05-02 NOTE — Telephone Encounter (Signed)
hospice referral in progress

## 2017-05-02 NOTE — Telephone Encounter (Signed)
Talked with patient daughter she is in agreement to hospice referral as prescribed by PCP, the script for iron will need to be faxed to [facility] .

## 2017-05-02 NOTE — Telephone Encounter (Signed)
Copied from [place] [id number]. Topic: Appointment Scheduling - Scheduling Inquiry for Clinic >> May 02, 2017  7:51 AM Arletha Grippe wrote: Reason for CRM: daughter clale d- pt was seen in ED on Friday, and the ED recommends that she have lab work asap.  They first hosp follow up is Thursday, daughter would like pt to be seen prior to that date, even if only for labs.  Please call Rosalyn at [phone]

## 2017-05-02 NOTE — Telephone Encounter (Signed)
See phone note dated 04/30/16

## 2017-05-03 NOTE — Telephone Encounter (Signed)
Faxed script to  [facility] daughter aware of Hospice referral.

## 2017-05-03 NOTE — Telephone Encounter (Signed)
Cindy Harvey did you send something over to Hospice on this patient. I see where there was a referral ordered for Hospice?

## 2017-05-04 ENCOUNTER — Telehealth: Payer: Self-pay | Admitting: Internal Medicine

## 2017-05-04 DIAGNOSIS — R040 Epistaxis: Secondary | ICD-10-CM | POA: Diagnosis not present

## 2017-05-04 NOTE — Telephone Encounter (Signed)
Copied from [place] [id number]. Topic: Quick Communication - See Telephone Encounter >> May 04, 2017  2:24 PM Conception Chancy, NT wrote: CRM for notification. See Telephone encounter for:  05/04/17.  [facility] is calling and states there was a order supposed to be sent over for the patient to take iron and have orange juice. They state they have not received anything. Please advise.  Fax: [phone]

## 2017-05-05 ENCOUNTER — Other Ambulatory Visit (INDEPENDENT_AMBULATORY_CARE_PROVIDER_SITE_OTHER): Payer: Medicare Other

## 2017-05-05 DIAGNOSIS — N184 Chronic kidney disease, stage 4 (severe): Secondary | ICD-10-CM

## 2017-05-05 DIAGNOSIS — D631 Anemia in chronic kidney disease: Secondary | ICD-10-CM | POA: Diagnosis not present

## 2017-05-05 LAB — BASIC METABOLIC PANEL
BUN/Creatinine Ratio: 11 (calc) (ref 6–22)
BUN: 39 mg/dL — AB (ref 7–25)
CHLORIDE: 106 mmol/L (ref 98–110)
CO2: 19 mmol/L — ABNORMAL LOW (ref 20–32)
CREATININE: 3.65 mg/dL — AB (ref 0.60–0.88)
Calcium: 9.5 mg/dL (ref 8.6–10.4)
GLUCOSE: 99 mg/dL (ref 65–99)
Potassium: 5.2 mmol/L (ref 3.5–5.3)
Sodium: 136 mmol/L (ref 135–146)

## 2017-05-05 LAB — IRON,TIBC AND FERRITIN PANEL
%SAT: 15 % (ref 11–50)
FERRITIN: 379 ng/mL — AB (ref 20–288)
Iron: 45 ug/dL (ref 45–160)
TIBC: 299 ug/dL (ref 250–450)

## 2017-05-05 NOTE — Addendum Note (Signed)
Addended by: Leeanne Rio on: 05/05/2017 10:04 AM   Modules accepted: Orders

## 2017-05-06 NOTE — Telephone Encounter (Signed)
Order was faxed and pt's daughter came by and picked up a copy of the order

## 2017-05-08 ENCOUNTER — Encounter: Payer: Self-pay | Admitting: Internal Medicine

## 2017-05-09 ENCOUNTER — Telehealth: Payer: Self-pay

## 2017-05-09 NOTE — Telephone Encounter (Signed)
Copied from [place]. Topic: Referral - Request >> May 09, 2017  4:33 PM Arletha Grippe wrote: Reason for CRM: [facility] -  She needs to have referral faxed to hospice home for pt - hospice did not receive the fax yet.  Please call hospice at [phone] for any questions.  [facility] person did not have the fax number

## 2017-05-10 ENCOUNTER — Telehealth: Payer: Self-pay

## 2017-05-10 NOTE — Telephone Encounter (Signed)
Copied from [place]. Topic: Referral - Request >> May 09, 2017  4:33 PM Arletha Grippe wrote: Reason for CRM: [facility] called -  She needs to have referral faxed to hospice home for pt - hospice did not receive the fax yet.  Please call hospice at [phone] for any questions.  [place] [facility] person did not have the fax number   >> May 10, 2017 11:56 AM Antonieta Iba C wrote: Hilda Blades with hospice called in to make provider aware that she is faxing over a hospice referral for for pt. They would need pt's last office notes and demographics.   Please assist further.

## 2017-05-10 NOTE — Telephone Encounter (Signed)
Copied from [place]. Topic: Referral - Medical Records >> May 10, 2017  4:16 PM Arletha Grippe wrote: Reason for CRM: stacey with hospice called, she need records for patient, last 3 visits.   Please cb [phone] if any questions

## 2017-05-11 NOTE — Telephone Encounter (Signed)
No apologies needed. Wherever the daughter wants her to receive hospice care is where I want her to be.  I assumed the she would receive Hospice care at [facility].

## 2017-05-11 NOTE — Telephone Encounter (Signed)
If [facility] will not keep her when hospice care is started that Sarr be the issue?

## 2017-05-11 NOTE — Telephone Encounter (Signed)
Refaxed by CMA numerous times. Will send once more.

## 2017-05-11 NOTE — Telephone Encounter (Signed)
Order has already been placed by Dr. Derrel Nip. Please advise.

## 2017-05-11 NOTE — Telephone Encounter (Signed)
Dr. Derrel Nip, I'm going to apologize in advance since this was done prior to me returning.  Did you want her to go to [facility]? I'm asking because her daughter came in yesterday with a [facility] referral form that she was going to take to them. If I need to switch please let me know and I will be happy to. Thanks, [employer]

## 2017-05-12 NOTE — Telephone Encounter (Signed)
Spoke to [employer]. She said that they use [facility] and is not sure where the daughter got the form for [facility]. They are scheduled to go out to speak to pt and family tomorrow.

## 2017-05-13 DIAGNOSIS — F411 Generalized anxiety disorder: Secondary | ICD-10-CM | POA: Diagnosis not present

## 2017-05-13 DIAGNOSIS — R627 Adult failure to thrive: Secondary | ICD-10-CM | POA: Diagnosis not present

## 2017-05-13 DIAGNOSIS — H3530 Unspecified macular degeneration: Secondary | ICD-10-CM | POA: Diagnosis not present

## 2017-05-13 DIAGNOSIS — F0150 Vascular dementia without behavioral disturbance: Secondary | ICD-10-CM | POA: Diagnosis not present

## 2017-05-13 DIAGNOSIS — I69318 Other symptoms and signs involving cognitive functions following cerebral infarction: Secondary | ICD-10-CM | POA: Diagnosis not present

## 2017-05-13 DIAGNOSIS — D631 Anemia in chronic kidney disease: Secondary | ICD-10-CM | POA: Diagnosis not present

## 2017-05-13 DIAGNOSIS — K921 Melena: Secondary | ICD-10-CM | POA: Diagnosis not present

## 2017-05-13 DIAGNOSIS — R634 Abnormal weight loss: Secondary | ICD-10-CM | POA: Diagnosis not present

## 2017-05-13 DIAGNOSIS — J309 Allergic rhinitis, unspecified: Secondary | ICD-10-CM | POA: Diagnosis not present

## 2017-05-13 DIAGNOSIS — N184 Chronic kidney disease, stage 4 (severe): Secondary | ICD-10-CM | POA: Diagnosis not present

## 2017-05-13 DIAGNOSIS — H9110 Presbycusis, unspecified ear: Secondary | ICD-10-CM | POA: Diagnosis not present

## 2017-05-13 DIAGNOSIS — E785 Hyperlipidemia, unspecified: Secondary | ICD-10-CM | POA: Diagnosis not present

## 2017-05-13 DIAGNOSIS — M1711 Unilateral primary osteoarthritis, right knee: Secondary | ICD-10-CM | POA: Diagnosis not present

## 2017-05-13 DIAGNOSIS — R011 Cardiac murmur, unspecified: Secondary | ICD-10-CM | POA: Diagnosis not present

## 2017-05-13 DIAGNOSIS — I129 Hypertensive chronic kidney disease with stage 1 through stage 4 chronic kidney disease, or unspecified chronic kidney disease: Secondary | ICD-10-CM | POA: Diagnosis not present

## 2017-05-13 DIAGNOSIS — I69354 Hemiplegia and hemiparesis following cerebral infarction affecting left non-dominant side: Secondary | ICD-10-CM | POA: Diagnosis not present

## 2017-05-13 DIAGNOSIS — F329 Major depressive disorder, single episode, unspecified: Secondary | ICD-10-CM | POA: Diagnosis not present

## 2017-05-16 DIAGNOSIS — I69318 Other symptoms and signs involving cognitive functions following cerebral infarction: Secondary | ICD-10-CM | POA: Diagnosis not present

## 2017-05-16 DIAGNOSIS — D631 Anemia in chronic kidney disease: Secondary | ICD-10-CM | POA: Diagnosis not present

## 2017-05-16 DIAGNOSIS — I129 Hypertensive chronic kidney disease with stage 1 through stage 4 chronic kidney disease, or unspecified chronic kidney disease: Secondary | ICD-10-CM | POA: Diagnosis not present

## 2017-05-16 DIAGNOSIS — F0150 Vascular dementia without behavioral disturbance: Secondary | ICD-10-CM | POA: Diagnosis not present

## 2017-05-16 DIAGNOSIS — N184 Chronic kidney disease, stage 4 (severe): Secondary | ICD-10-CM | POA: Diagnosis not present

## 2017-05-16 DIAGNOSIS — I69354 Hemiplegia and hemiparesis following cerebral infarction affecting left non-dominant side: Secondary | ICD-10-CM | POA: Diagnosis not present

## 2017-05-16 NOTE — Telephone Encounter (Signed)
FYI

## 2017-05-16 NOTE — Telephone Encounter (Signed)
Pt was admitted on 05/13/2017.

## 2017-05-17 DIAGNOSIS — N184 Chronic kidney disease, stage 4 (severe): Secondary | ICD-10-CM | POA: Diagnosis not present

## 2017-05-17 DIAGNOSIS — I129 Hypertensive chronic kidney disease with stage 1 through stage 4 chronic kidney disease, or unspecified chronic kidney disease: Secondary | ICD-10-CM | POA: Diagnosis not present

## 2017-05-17 DIAGNOSIS — I69318 Other symptoms and signs involving cognitive functions following cerebral infarction: Secondary | ICD-10-CM | POA: Diagnosis not present

## 2017-05-17 DIAGNOSIS — F0150 Vascular dementia without behavioral disturbance: Secondary | ICD-10-CM | POA: Diagnosis not present

## 2017-05-17 DIAGNOSIS — D631 Anemia in chronic kidney disease: Secondary | ICD-10-CM | POA: Diagnosis not present

## 2017-05-17 DIAGNOSIS — I69354 Hemiplegia and hemiparesis following cerebral infarction affecting left non-dominant side: Secondary | ICD-10-CM | POA: Diagnosis not present

## 2017-05-18 ENCOUNTER — Ambulatory Visit (INDEPENDENT_AMBULATORY_CARE_PROVIDER_SITE_OTHER): Admitting: Internal Medicine

## 2017-05-18 ENCOUNTER — Encounter: Payer: Self-pay | Admitting: Internal Medicine

## 2017-05-18 DIAGNOSIS — D631 Anemia in chronic kidney disease: Secondary | ICD-10-CM | POA: Diagnosis not present

## 2017-05-18 DIAGNOSIS — F0150 Vascular dementia without behavioral disturbance: Secondary | ICD-10-CM | POA: Diagnosis not present

## 2017-05-18 DIAGNOSIS — Z515 Encounter for palliative care: Secondary | ICD-10-CM

## 2017-05-18 DIAGNOSIS — N186 End stage renal disease: Secondary | ICD-10-CM | POA: Diagnosis not present

## 2017-05-18 DIAGNOSIS — I69318 Other symptoms and signs involving cognitive functions following cerebral infarction: Secondary | ICD-10-CM | POA: Diagnosis not present

## 2017-05-18 DIAGNOSIS — I69354 Hemiplegia and hemiparesis following cerebral infarction affecting left non-dominant side: Secondary | ICD-10-CM | POA: Diagnosis not present

## 2017-05-18 DIAGNOSIS — N184 Chronic kidney disease, stage 4 (severe): Secondary | ICD-10-CM | POA: Diagnosis not present

## 2017-05-18 DIAGNOSIS — I129 Hypertensive chronic kidney disease with stage 1 through stage 4 chronic kidney disease, or unspecified chronic kidney disease: Secondary | ICD-10-CM | POA: Diagnosis not present

## 2017-05-18 NOTE — Progress Notes (Signed)
Subjective:  Patient ID: Cindy Harvey, female    DOB: Dec 22, 1916  Age: 84 y.o. MRN: [id number]  CC: Diagnoses of End stage kidney disease ([place]) and Encounter for end of life care were pertinent to this visit.  HPI Cindy Harvey presents for follow upon ER visit .  nt was  referred and evaluated in the  ER for tsevere anemia in the setting of recent recurrent epistaxis of unclear etiology.  She was found to be in stage 4 renal failure.  No transfusion was done per mutual agreement by daughter Cindy Harvey and patinet was returned to [facility] with directions to start an iron supplement.  Since the, Hospice  Services has been requested by family  For end of life issues  Relating to  ESKD and severe anemia .  Daughter has requested Hospice Care to be done at [facility]  She is in good spirits today .  Tolerating her medications.  Appetite diminished and she has had a weight loss of 14 lbs sine her last visit in July . f  Wants me to assure her that she will " live to be 89."  She does not remember the ER visit  Skin tear on arm occurred today.   Outpatient Medications Prior to Visit  Medication Sig Dispense Refill  . acetaminophen (TYLENOL) 160 MG/5ML liquid Take 10.2 mLs (325 mg total) by mouth daily as needed for fever (knee pain or headache). 473 mL 5  . acetaminophen (TYLENOL) 325 MG tablet Take 650 mg by mouth every 4 (four) hours as needed for fever (pain).    Marland Kitchen amLODipine (NORVASC) 5 MG tablet TAKE 1 TABLET (5 MG TOTAL) BY MOUTH DAILY. 30 tablet 11  . ezetimibe (ZETIA) 10 MG tablet Take 1 tablet (10 mg total) by mouth daily. 30 tablet 11  . ferrous sulfate 325 (65 FE) MG tablet Daily with a glass of orange juice 90 tablet 3  . fexofenadine (ALLEGRA) 60 MG tablet Take 1 tablet (60 mg total) by mouth daily. 30 tablet 11  . guaiFENesin (MUCINEX CHEST CONGESTION CHILD) 100 MG/5ML liquid Take 10 mLs (200 mg total) by mouth 3 (three) times daily as needed for cough or congestion. 120 mL 3  .  loperamide (IMODIUM A-D) 2 MG tablet Take 2 mg by mouth 3 (three) times daily as needed for diarrhea or loose stools.    . meloxicam (MOBIC) 7.5 MG tablet TAKE 1 TABLET (7.5 MG TOTAL) BY MOUTH DAILY. 30 tablet 5  . Multiple Vitamins-Minerals (EQ MULTIVITAMINS ADULT GUMMY) CHEW Chew 1 tablet by mouth 2 (two) times daily before lunch and supper. 60 tablet 11  . multivitamin-lutein (OCUVITE-LUTEIN) CAPS capsule Take 1 capsule by mouth daily.    . niacin (NIASPAN) 500 MG CR tablet Take 2 tablets (1,000 mg total) by mouth at bedtime. 60 tablet 12  . Omega-3 Fatty Acids (OMEGA-3 FISH OIL PO) Take 5 mLs by mouth daily.    Marland Kitchen PARoxetine (PAXIL) 10 MG tablet Take 10 mg by mouth daily.    . promethazine (PHENERGAN) 12.5 MG tablet Take 1 tablet (12.5 mg total) by mouth every 8 (eight) hours as needed for nausea or vomiting. 20 tablet 3  . QUEtiapine (SEROQUEL) 25 MG tablet Take 1 tablet (25 mg total) by mouth at bedtime. (Patient taking differently: Take 12.5 mg by mouth at bedtime. ) 30 tablet 5   No facility-administered medications prior to visit.     Review of Systems;  Patient denies headache, fevers, malaise,  unintentional weight loss, skin rash, eye pain, sinus congestion and sinus pain, sore throat, dysphagia,  hemoptysis , cough, dyspnea, wheezing, chest pain, palpitations, orthopnea, edema, abdominal pain, nausea, melena, diarrhea, constipation, flank pain, dysuria, hematuria, urinary  Frequency, nocturia, numbness, tingling, seizures,  Focal weakness, Loss of consciousness,  Tremor, insomnia, depression, anxiety, and suicidal ideation.      Objective:  BP (!) 146/66 (BP Location: Left Arm, Patient Position: Sitting, Cuff Size: Normal)   Pulse 71   Temp 98 F (36.7 C) (Oral)   Resp 16   Ht 5\' 1"  (1.549 m)   Wt 106 lb 6.4 oz (48.3 kg)   SpO2 93%   BMI 20.10 kg/m   BP Readings from Last 3 Encounters:  05/18/17 (!) 146/66  04/30/17 (!) 161/61  04/25/17 125/90    Wt Readings from Last  3 Encounters:  05/18/17 106 lb 6.4 oz (48.3 kg)  04/29/17 101 lb (45.8 kg)  04/24/17 102 lb (46.3 kg)    General appearance: alert, cooperative and appears stated age Ears: normal TM's and external ear canals both ears Throat: lips, mucosa, and tongue normal; teeth and gums normal Neck: no adenopathy, no carotid bruit, supple, symmetrical, trachea midline and thyroid not enlarged, symmetric, no tenderness/mass/nodules Back: symmetric, no curvature. ROM normal. No CVA tenderness. Lungs: clear to auscultation bilaterally Heart: regular rate and rhythm, S1, S2 normal, no murmur, click, rub or gallop Abdomen: soft, non-tender; bowel sounds normal; no masses,  no organomegaly Pulses: 2+ and symmetric Skin: Skin color, texture, turgor normal. No rashes or lesions Lymph nodes: Cervical, supraclavicular, and axillary nodes normal.  Lab Results  Component Value Date   HGBA1C 5.5 02/26/2016    Lab Results  Component Value Date   CREATININE 3.65 (H) 05/05/2017   CREATININE 3.50 (H) 04/29/2017   CREATININE 3.28 (H) 04/29/2017    Lab Results  Component Value Date   WBC 8.4 04/29/2017   HGB 7.8 (L) 04/29/2017   HCT 23.0 (L) 04/29/2017   PLT 206 04/29/2017   GLUCOSE 99 05/05/2017   CHOL 169 02/26/2016   TRIG 132 02/26/2016   HDL 39 (L) 02/26/2016   LDLDIRECT 164.7 01/31/2013   LDLCALC 104 (H) 02/26/2016   ALT 12 (L) 10/30/2016   AST 15 10/30/2016   NA 136 05/05/2017   K 5.2 05/05/2017   CL 106 05/05/2017   CREATININE 3.65 (H) 05/05/2017   BUN 39 (H) 05/05/2017   CO2 19 (L) 05/05/2017   TSH 0.52 09/26/2013   HGBA1C 5.5 02/26/2016    No results found.  Assessment & Plan:   Problem List Items Addressed This Visit    End stage kidney disease ([place])    With severe anemia and likely platelet dysfunction due to uremia  .  Hospice care in place;  no changes today       Encounter for end of life care    Hospice orders were signed and managed by caregiver Dolores Hoose  (patient's daughter in law ).  Patient will remain at [facility] for as long as possible          I am having Cindy Harvey maintain her niacin, amLODipine, ezetimibe, meloxicam, PARoxetine, acetaminophen, fexofenadine, guaiFENesin, EQ MULTIVITAMINS ADULT GUMMY, promethazine, QUEtiapine, multivitamin-lutein, Omega-3 Fatty Acids (OMEGA-3 FISH OIL PO), acetaminophen, loperamide, and ferrous sulfate.  No orders of the defined types were placed in this encounter.   There are no discontinued medications.  Follow-up: No follow-ups on file.   Crecencio Mc, MD

## 2017-05-19 DIAGNOSIS — I69354 Hemiplegia and hemiparesis following cerebral infarction affecting left non-dominant side: Secondary | ICD-10-CM | POA: Diagnosis not present

## 2017-05-19 DIAGNOSIS — I69318 Other symptoms and signs involving cognitive functions following cerebral infarction: Secondary | ICD-10-CM | POA: Diagnosis not present

## 2017-05-19 DIAGNOSIS — N184 Chronic kidney disease, stage 4 (severe): Secondary | ICD-10-CM | POA: Diagnosis not present

## 2017-05-19 DIAGNOSIS — D631 Anemia in chronic kidney disease: Secondary | ICD-10-CM | POA: Diagnosis not present

## 2017-05-19 DIAGNOSIS — F0150 Vascular dementia without behavioral disturbance: Secondary | ICD-10-CM | POA: Diagnosis not present

## 2017-05-19 DIAGNOSIS — I129 Hypertensive chronic kidney disease with stage 1 through stage 4 chronic kidney disease, or unspecified chronic kidney disease: Secondary | ICD-10-CM | POA: Diagnosis not present

## 2017-05-20 DIAGNOSIS — I129 Hypertensive chronic kidney disease with stage 1 through stage 4 chronic kidney disease, or unspecified chronic kidney disease: Secondary | ICD-10-CM | POA: Diagnosis not present

## 2017-05-20 DIAGNOSIS — N184 Chronic kidney disease, stage 4 (severe): Secondary | ICD-10-CM | POA: Diagnosis not present

## 2017-05-20 DIAGNOSIS — D631 Anemia in chronic kidney disease: Secondary | ICD-10-CM | POA: Diagnosis not present

## 2017-05-20 DIAGNOSIS — I69318 Other symptoms and signs involving cognitive functions following cerebral infarction: Secondary | ICD-10-CM | POA: Diagnosis not present

## 2017-05-20 DIAGNOSIS — I69354 Hemiplegia and hemiparesis following cerebral infarction affecting left non-dominant side: Secondary | ICD-10-CM | POA: Diagnosis not present

## 2017-05-20 DIAGNOSIS — F0150 Vascular dementia without behavioral disturbance: Secondary | ICD-10-CM | POA: Diagnosis not present

## 2017-05-21 NOTE — Assessment & Plan Note (Addendum)
Hospice orders were signed and managed by caregiver Dolores Hoose (patient's daughter in law ).  Patient will remain at [facility] for as long as possible

## 2017-05-21 NOTE — Assessment & Plan Note (Signed)
With severe anemia and likely platelet dysfunction due to uremia  .  Hospice care in place;  no changes today

## 2017-05-23 DIAGNOSIS — I69354 Hemiplegia and hemiparesis following cerebral infarction affecting left non-dominant side: Secondary | ICD-10-CM | POA: Diagnosis not present

## 2017-05-23 DIAGNOSIS — F329 Major depressive disorder, single episode, unspecified: Secondary | ICD-10-CM | POA: Diagnosis not present

## 2017-05-23 DIAGNOSIS — R627 Adult failure to thrive: Secondary | ICD-10-CM | POA: Diagnosis not present

## 2017-05-23 DIAGNOSIS — E785 Hyperlipidemia, unspecified: Secondary | ICD-10-CM | POA: Diagnosis not present

## 2017-05-23 DIAGNOSIS — M1711 Unilateral primary osteoarthritis, right knee: Secondary | ICD-10-CM | POA: Diagnosis not present

## 2017-05-23 DIAGNOSIS — F411 Generalized anxiety disorder: Secondary | ICD-10-CM | POA: Diagnosis not present

## 2017-05-23 DIAGNOSIS — I129 Hypertensive chronic kidney disease with stage 1 through stage 4 chronic kidney disease, or unspecified chronic kidney disease: Secondary | ICD-10-CM | POA: Diagnosis not present

## 2017-05-23 DIAGNOSIS — D631 Anemia in chronic kidney disease: Secondary | ICD-10-CM | POA: Diagnosis not present

## 2017-05-23 DIAGNOSIS — N184 Chronic kidney disease, stage 4 (severe): Secondary | ICD-10-CM | POA: Diagnosis not present

## 2017-05-23 DIAGNOSIS — K921 Melena: Secondary | ICD-10-CM | POA: Diagnosis not present

## 2017-05-23 DIAGNOSIS — H9110 Presbycusis, unspecified ear: Secondary | ICD-10-CM | POA: Diagnosis not present

## 2017-05-23 DIAGNOSIS — J309 Allergic rhinitis, unspecified: Secondary | ICD-10-CM | POA: Diagnosis not present

## 2017-05-23 DIAGNOSIS — F0150 Vascular dementia without behavioral disturbance: Secondary | ICD-10-CM | POA: Diagnosis not present

## 2017-05-23 DIAGNOSIS — R634 Abnormal weight loss: Secondary | ICD-10-CM | POA: Diagnosis not present

## 2017-05-23 DIAGNOSIS — I69318 Other symptoms and signs involving cognitive functions following cerebral infarction: Secondary | ICD-10-CM | POA: Diagnosis not present

## 2017-05-23 DIAGNOSIS — R011 Cardiac murmur, unspecified: Secondary | ICD-10-CM | POA: Diagnosis not present

## 2017-05-23 DIAGNOSIS — H3530 Unspecified macular degeneration: Secondary | ICD-10-CM | POA: Diagnosis not present

## 2017-05-24 DIAGNOSIS — D631 Anemia in chronic kidney disease: Secondary | ICD-10-CM | POA: Diagnosis not present

## 2017-05-24 DIAGNOSIS — I69354 Hemiplegia and hemiparesis following cerebral infarction affecting left non-dominant side: Secondary | ICD-10-CM | POA: Diagnosis not present

## 2017-05-24 DIAGNOSIS — I129 Hypertensive chronic kidney disease with stage 1 through stage 4 chronic kidney disease, or unspecified chronic kidney disease: Secondary | ICD-10-CM | POA: Diagnosis not present

## 2017-05-24 DIAGNOSIS — I69318 Other symptoms and signs involving cognitive functions following cerebral infarction: Secondary | ICD-10-CM | POA: Diagnosis not present

## 2017-05-24 DIAGNOSIS — N184 Chronic kidney disease, stage 4 (severe): Secondary | ICD-10-CM | POA: Diagnosis not present

## 2017-05-24 DIAGNOSIS — F0150 Vascular dementia without behavioral disturbance: Secondary | ICD-10-CM | POA: Diagnosis not present

## 2017-05-25 DIAGNOSIS — F0150 Vascular dementia without behavioral disturbance: Secondary | ICD-10-CM | POA: Diagnosis not present

## 2017-05-25 DIAGNOSIS — I69354 Hemiplegia and hemiparesis following cerebral infarction affecting left non-dominant side: Secondary | ICD-10-CM | POA: Diagnosis not present

## 2017-05-25 DIAGNOSIS — I129 Hypertensive chronic kidney disease with stage 1 through stage 4 chronic kidney disease, or unspecified chronic kidney disease: Secondary | ICD-10-CM | POA: Diagnosis not present

## 2017-05-25 DIAGNOSIS — D631 Anemia in chronic kidney disease: Secondary | ICD-10-CM | POA: Diagnosis not present

## 2017-05-25 DIAGNOSIS — I69318 Other symptoms and signs involving cognitive functions following cerebral infarction: Secondary | ICD-10-CM | POA: Diagnosis not present

## 2017-05-25 DIAGNOSIS — N184 Chronic kidney disease, stage 4 (severe): Secondary | ICD-10-CM | POA: Diagnosis not present

## 2017-05-26 ENCOUNTER — Telehealth: Payer: Self-pay

## 2017-05-26 DIAGNOSIS — D631 Anemia in chronic kidney disease: Secondary | ICD-10-CM | POA: Diagnosis not present

## 2017-05-26 DIAGNOSIS — N184 Chronic kidney disease, stage 4 (severe): Secondary | ICD-10-CM | POA: Diagnosis not present

## 2017-05-26 DIAGNOSIS — I69318 Other symptoms and signs involving cognitive functions following cerebral infarction: Secondary | ICD-10-CM | POA: Diagnosis not present

## 2017-05-26 DIAGNOSIS — I69354 Hemiplegia and hemiparesis following cerebral infarction affecting left non-dominant side: Secondary | ICD-10-CM | POA: Diagnosis not present

## 2017-05-26 DIAGNOSIS — F0150 Vascular dementia without behavioral disturbance: Secondary | ICD-10-CM | POA: Diagnosis not present

## 2017-05-26 DIAGNOSIS — I129 Hypertensive chronic kidney disease with stage 1 through stage 4 chronic kidney disease, or unspecified chronic kidney disease: Secondary | ICD-10-CM | POA: Diagnosis not present

## 2017-05-26 NOTE — Telephone Encounter (Signed)
She does not need to keep the April 8 appointment

## 2017-05-26 NOTE — Telephone Encounter (Signed)
Correction April 9 appt should be cancelled

## 2017-05-26 NOTE — Telephone Encounter (Signed)
Does pt need to keep appt with you on 05/31/2017? Pt is under Hospice care and just saw you on 05/18/2017.

## 2017-05-26 NOTE — Telephone Encounter (Signed)
appt has been canceled. Left a detailed message letting the daughter know the appt has been canceled.

## 2017-05-26 NOTE — Telephone Encounter (Signed)
Copied from [place]. Topic: Appointment Scheduling - Scheduling Inquiry for Clinic >> May 26, 2017 11:22 AM Rutherford Nail, [place] wrote: Reason for CRM: Pt's daughter calling to see whether the appointment for 4/9 needs to e cancelled or not. States her mother just seen Dr Derrel Nip on march 27. Her mother is now on hospice care and just wanted to verify, should she keep the 4/9 appointment or not. Please call daughter. Patient's daughter will be tied up between 2pm and 3pm today. Stated it was okay to leave a detailed message on her voice mail. [phone]

## 2017-05-27 DIAGNOSIS — N184 Chronic kidney disease, stage 4 (severe): Secondary | ICD-10-CM | POA: Diagnosis not present

## 2017-05-27 DIAGNOSIS — F0150 Vascular dementia without behavioral disturbance: Secondary | ICD-10-CM | POA: Diagnosis not present

## 2017-05-27 DIAGNOSIS — D631 Anemia in chronic kidney disease: Secondary | ICD-10-CM | POA: Diagnosis not present

## 2017-05-27 DIAGNOSIS — I69354 Hemiplegia and hemiparesis following cerebral infarction affecting left non-dominant side: Secondary | ICD-10-CM | POA: Diagnosis not present

## 2017-05-27 DIAGNOSIS — I129 Hypertensive chronic kidney disease with stage 1 through stage 4 chronic kidney disease, or unspecified chronic kidney disease: Secondary | ICD-10-CM | POA: Diagnosis not present

## 2017-05-27 DIAGNOSIS — I69318 Other symptoms and signs involving cognitive functions following cerebral infarction: Secondary | ICD-10-CM | POA: Diagnosis not present

## 2017-05-30 DIAGNOSIS — I69354 Hemiplegia and hemiparesis following cerebral infarction affecting left non-dominant side: Secondary | ICD-10-CM | POA: Diagnosis not present

## 2017-05-30 DIAGNOSIS — F0150 Vascular dementia without behavioral disturbance: Secondary | ICD-10-CM | POA: Diagnosis not present

## 2017-05-30 DIAGNOSIS — I129 Hypertensive chronic kidney disease with stage 1 through stage 4 chronic kidney disease, or unspecified chronic kidney disease: Secondary | ICD-10-CM | POA: Diagnosis not present

## 2017-05-30 DIAGNOSIS — N184 Chronic kidney disease, stage 4 (severe): Secondary | ICD-10-CM | POA: Diagnosis not present

## 2017-05-30 DIAGNOSIS — D631 Anemia in chronic kidney disease: Secondary | ICD-10-CM | POA: Diagnosis not present

## 2017-05-30 DIAGNOSIS — I69318 Other symptoms and signs involving cognitive functions following cerebral infarction: Secondary | ICD-10-CM | POA: Diagnosis not present

## 2017-05-31 ENCOUNTER — Ambulatory Visit: Payer: Medicare Other | Admitting: Internal Medicine

## 2017-05-31 ENCOUNTER — Telehealth: Payer: Self-pay | Admitting: Internal Medicine

## 2017-05-31 DIAGNOSIS — N184 Chronic kidney disease, stage 4 (severe): Secondary | ICD-10-CM | POA: Diagnosis not present

## 2017-05-31 DIAGNOSIS — I129 Hypertensive chronic kidney disease with stage 1 through stage 4 chronic kidney disease, or unspecified chronic kidney disease: Secondary | ICD-10-CM | POA: Diagnosis not present

## 2017-05-31 DIAGNOSIS — I69318 Other symptoms and signs involving cognitive functions following cerebral infarction: Secondary | ICD-10-CM | POA: Diagnosis not present

## 2017-05-31 DIAGNOSIS — D631 Anemia in chronic kidney disease: Secondary | ICD-10-CM | POA: Diagnosis not present

## 2017-05-31 DIAGNOSIS — I69354 Hemiplegia and hemiparesis following cerebral infarction affecting left non-dominant side: Secondary | ICD-10-CM | POA: Diagnosis not present

## 2017-05-31 DIAGNOSIS — F0150 Vascular dementia without behavioral disturbance: Secondary | ICD-10-CM | POA: Diagnosis not present

## 2017-05-31 NOTE — Telephone Encounter (Signed)
Copied from [place] [phone]. Topic: General - Other >> May 31, 2017  2:42 PM Yvette Rack wrote: Reason for CRM: Nurse Erline Levine from [facility] [phone] calling for a RX Lasix she states that the patient isn't doing well and that she has a lot of SOB with cracking in the lungs she had already ordered oxygen for pt

## 2017-05-31 NOTE — Telephone Encounter (Signed)
Increase the lasix to 40 mg daily

## 2017-05-31 NOTE — Telephone Encounter (Signed)
Please advise 

## 2017-06-01 DIAGNOSIS — N184 Chronic kidney disease, stage 4 (severe): Secondary | ICD-10-CM | POA: Diagnosis not present

## 2017-06-01 DIAGNOSIS — F0150 Vascular dementia without behavioral disturbance: Secondary | ICD-10-CM | POA: Diagnosis not present

## 2017-06-01 DIAGNOSIS — I129 Hypertensive chronic kidney disease with stage 1 through stage 4 chronic kidney disease, or unspecified chronic kidney disease: Secondary | ICD-10-CM | POA: Diagnosis not present

## 2017-06-01 DIAGNOSIS — D631 Anemia in chronic kidney disease: Secondary | ICD-10-CM | POA: Diagnosis not present

## 2017-06-01 DIAGNOSIS — I69354 Hemiplegia and hemiparesis following cerebral infarction affecting left non-dominant side: Secondary | ICD-10-CM | POA: Diagnosis not present

## 2017-06-01 DIAGNOSIS — I69318 Other symptoms and signs involving cognitive functions following cerebral infarction: Secondary | ICD-10-CM | POA: Diagnosis not present

## 2017-06-01 NOTE — Telephone Encounter (Signed)
I spoke with nurse Marzetta Board & because she didn't get a call back urgently she spoke with her medical director & they started her on Lasix 10mg  daily. She will let us know if anything else is needed.

## 2017-06-02 ENCOUNTER — Telehealth: Payer: Self-pay | Admitting: Internal Medicine

## 2017-06-02 DIAGNOSIS — I69354 Hemiplegia and hemiparesis following cerebral infarction affecting left non-dominant side: Secondary | ICD-10-CM | POA: Diagnosis not present

## 2017-06-02 DIAGNOSIS — F0150 Vascular dementia without behavioral disturbance: Secondary | ICD-10-CM | POA: Diagnosis not present

## 2017-06-02 DIAGNOSIS — N184 Chronic kidney disease, stage 4 (severe): Secondary | ICD-10-CM | POA: Diagnosis not present

## 2017-06-02 DIAGNOSIS — D631 Anemia in chronic kidney disease: Secondary | ICD-10-CM | POA: Diagnosis not present

## 2017-06-02 DIAGNOSIS — I69318 Other symptoms and signs involving cognitive functions following cerebral infarction: Secondary | ICD-10-CM | POA: Diagnosis not present

## 2017-06-02 DIAGNOSIS — I129 Hypertensive chronic kidney disease with stage 1 through stage 4 chronic kidney disease, or unspecified chronic kidney disease: Secondary | ICD-10-CM | POA: Diagnosis not present

## 2017-06-02 NOTE — Telephone Encounter (Signed)
If she is doing better and blood pressure tolerating, I am ok to extend out for four more days, but please forward to Dr Derrel Nip for her Juluis Rainier and confirm no further recs.  Thanks

## 2017-06-02 NOTE — Telephone Encounter (Signed)
Ok to continue lasix 10 mg daily until crackles resolve or patient appears dehydrated.  She is 89 and on hospice

## 2017-06-02 NOTE — Telephone Encounter (Signed)
Stacey from Hospice notified per Dr. Bary Leriche request, Erline Levine stated BP is good and patient doing better just has some crackles left in the bottom portion of lungs.

## 2017-06-02 NOTE — Telephone Encounter (Signed)
Please advise 

## 2017-06-02 NOTE — Telephone Encounter (Unsigned)
Copied from [place] [id number]. Topic: Quick Communication - See Telephone Encounter >> Jun 02, 2017  2:50 PM Neva Seat wrote: Erline Levine w/ Hospice [phone]  Wanting to know if pt's Lasix could be extended for 4 more days.  Pt's lungs still has crackles in it.

## 2017-06-02 NOTE — Telephone Encounter (Signed)
SOB is better but hospice nurse wants to Extend lasix for 4 more days because she still has crackles ok to give order?

## 2017-06-03 DIAGNOSIS — F0150 Vascular dementia without behavioral disturbance: Secondary | ICD-10-CM | POA: Diagnosis not present

## 2017-06-03 DIAGNOSIS — I69354 Hemiplegia and hemiparesis following cerebral infarction affecting left non-dominant side: Secondary | ICD-10-CM | POA: Diagnosis not present

## 2017-06-03 DIAGNOSIS — N184 Chronic kidney disease, stage 4 (severe): Secondary | ICD-10-CM | POA: Diagnosis not present

## 2017-06-03 DIAGNOSIS — I69318 Other symptoms and signs involving cognitive functions following cerebral infarction: Secondary | ICD-10-CM | POA: Diagnosis not present

## 2017-06-03 DIAGNOSIS — I129 Hypertensive chronic kidney disease with stage 1 through stage 4 chronic kidney disease, or unspecified chronic kidney disease: Secondary | ICD-10-CM | POA: Diagnosis not present

## 2017-06-03 DIAGNOSIS — D631 Anemia in chronic kidney disease: Secondary | ICD-10-CM | POA: Diagnosis not present

## 2017-06-03 NOTE — Telephone Encounter (Signed)
Stacey from [facility] notified.

## 2017-06-06 DIAGNOSIS — I69354 Hemiplegia and hemiparesis following cerebral infarction affecting left non-dominant side: Secondary | ICD-10-CM | POA: Diagnosis not present

## 2017-06-06 DIAGNOSIS — F0150 Vascular dementia without behavioral disturbance: Secondary | ICD-10-CM | POA: Diagnosis not present

## 2017-06-06 DIAGNOSIS — I69318 Other symptoms and signs involving cognitive functions following cerebral infarction: Secondary | ICD-10-CM | POA: Diagnosis not present

## 2017-06-06 DIAGNOSIS — D631 Anemia in chronic kidney disease: Secondary | ICD-10-CM | POA: Diagnosis not present

## 2017-06-06 DIAGNOSIS — N184 Chronic kidney disease, stage 4 (severe): Secondary | ICD-10-CM | POA: Diagnosis not present

## 2017-06-06 DIAGNOSIS — I129 Hypertensive chronic kidney disease with stage 1 through stage 4 chronic kidney disease, or unspecified chronic kidney disease: Secondary | ICD-10-CM | POA: Diagnosis not present

## 2017-06-07 DIAGNOSIS — I69318 Other symptoms and signs involving cognitive functions following cerebral infarction: Secondary | ICD-10-CM | POA: Diagnosis not present

## 2017-06-07 DIAGNOSIS — D631 Anemia in chronic kidney disease: Secondary | ICD-10-CM | POA: Diagnosis not present

## 2017-06-07 DIAGNOSIS — F0150 Vascular dementia without behavioral disturbance: Secondary | ICD-10-CM | POA: Diagnosis not present

## 2017-06-07 DIAGNOSIS — I69354 Hemiplegia and hemiparesis following cerebral infarction affecting left non-dominant side: Secondary | ICD-10-CM | POA: Diagnosis not present

## 2017-06-07 DIAGNOSIS — I129 Hypertensive chronic kidney disease with stage 1 through stage 4 chronic kidney disease, or unspecified chronic kidney disease: Secondary | ICD-10-CM | POA: Diagnosis not present

## 2017-06-07 DIAGNOSIS — N184 Chronic kidney disease, stage 4 (severe): Secondary | ICD-10-CM | POA: Diagnosis not present

## 2017-06-08 DIAGNOSIS — I69318 Other symptoms and signs involving cognitive functions following cerebral infarction: Secondary | ICD-10-CM | POA: Diagnosis not present

## 2017-06-08 DIAGNOSIS — I69354 Hemiplegia and hemiparesis following cerebral infarction affecting left non-dominant side: Secondary | ICD-10-CM | POA: Diagnosis not present

## 2017-06-08 DIAGNOSIS — D631 Anemia in chronic kidney disease: Secondary | ICD-10-CM | POA: Diagnosis not present

## 2017-06-08 DIAGNOSIS — N184 Chronic kidney disease, stage 4 (severe): Secondary | ICD-10-CM | POA: Diagnosis not present

## 2017-06-08 DIAGNOSIS — F0150 Vascular dementia without behavioral disturbance: Secondary | ICD-10-CM | POA: Diagnosis not present

## 2017-06-08 DIAGNOSIS — I129 Hypertensive chronic kidney disease with stage 1 through stage 4 chronic kidney disease, or unspecified chronic kidney disease: Secondary | ICD-10-CM | POA: Diagnosis not present

## 2017-06-09 DIAGNOSIS — F0150 Vascular dementia without behavioral disturbance: Secondary | ICD-10-CM | POA: Diagnosis not present

## 2017-06-09 DIAGNOSIS — I69354 Hemiplegia and hemiparesis following cerebral infarction affecting left non-dominant side: Secondary | ICD-10-CM | POA: Diagnosis not present

## 2017-06-09 DIAGNOSIS — I129 Hypertensive chronic kidney disease with stage 1 through stage 4 chronic kidney disease, or unspecified chronic kidney disease: Secondary | ICD-10-CM | POA: Diagnosis not present

## 2017-06-09 DIAGNOSIS — N184 Chronic kidney disease, stage 4 (severe): Secondary | ICD-10-CM | POA: Diagnosis not present

## 2017-06-09 DIAGNOSIS — I69318 Other symptoms and signs involving cognitive functions following cerebral infarction: Secondary | ICD-10-CM | POA: Diagnosis not present

## 2017-06-09 DIAGNOSIS — D631 Anemia in chronic kidney disease: Secondary | ICD-10-CM | POA: Diagnosis not present

## 2017-06-10 DIAGNOSIS — D631 Anemia in chronic kidney disease: Secondary | ICD-10-CM | POA: Diagnosis not present

## 2017-06-10 DIAGNOSIS — I69318 Other symptoms and signs involving cognitive functions following cerebral infarction: Secondary | ICD-10-CM | POA: Diagnosis not present

## 2017-06-10 DIAGNOSIS — N184 Chronic kidney disease, stage 4 (severe): Secondary | ICD-10-CM | POA: Diagnosis not present

## 2017-06-10 DIAGNOSIS — F0150 Vascular dementia without behavioral disturbance: Secondary | ICD-10-CM | POA: Diagnosis not present

## 2017-06-10 DIAGNOSIS — I69354 Hemiplegia and hemiparesis following cerebral infarction affecting left non-dominant side: Secondary | ICD-10-CM | POA: Diagnosis not present

## 2017-06-10 DIAGNOSIS — I129 Hypertensive chronic kidney disease with stage 1 through stage 4 chronic kidney disease, or unspecified chronic kidney disease: Secondary | ICD-10-CM | POA: Diagnosis not present

## 2017-06-13 DIAGNOSIS — I69318 Other symptoms and signs involving cognitive functions following cerebral infarction: Secondary | ICD-10-CM | POA: Diagnosis not present

## 2017-06-13 DIAGNOSIS — D631 Anemia in chronic kidney disease: Secondary | ICD-10-CM | POA: Diagnosis not present

## 2017-06-13 DIAGNOSIS — N184 Chronic kidney disease, stage 4 (severe): Secondary | ICD-10-CM | POA: Diagnosis not present

## 2017-06-13 DIAGNOSIS — F0150 Vascular dementia without behavioral disturbance: Secondary | ICD-10-CM | POA: Diagnosis not present

## 2017-06-13 DIAGNOSIS — I69354 Hemiplegia and hemiparesis following cerebral infarction affecting left non-dominant side: Secondary | ICD-10-CM | POA: Diagnosis not present

## 2017-06-13 DIAGNOSIS — I129 Hypertensive chronic kidney disease with stage 1 through stage 4 chronic kidney disease, or unspecified chronic kidney disease: Secondary | ICD-10-CM | POA: Diagnosis not present

## 2017-06-14 DIAGNOSIS — F0150 Vascular dementia without behavioral disturbance: Secondary | ICD-10-CM | POA: Diagnosis not present

## 2017-06-14 DIAGNOSIS — I69354 Hemiplegia and hemiparesis following cerebral infarction affecting left non-dominant side: Secondary | ICD-10-CM | POA: Diagnosis not present

## 2017-06-14 DIAGNOSIS — I129 Hypertensive chronic kidney disease with stage 1 through stage 4 chronic kidney disease, or unspecified chronic kidney disease: Secondary | ICD-10-CM | POA: Diagnosis not present

## 2017-06-14 DIAGNOSIS — N184 Chronic kidney disease, stage 4 (severe): Secondary | ICD-10-CM | POA: Diagnosis not present

## 2017-06-14 DIAGNOSIS — D631 Anemia in chronic kidney disease: Secondary | ICD-10-CM | POA: Diagnosis not present

## 2017-06-14 DIAGNOSIS — I69318 Other symptoms and signs involving cognitive functions following cerebral infarction: Secondary | ICD-10-CM | POA: Diagnosis not present

## 2017-06-15 DIAGNOSIS — F0150 Vascular dementia without behavioral disturbance: Secondary | ICD-10-CM | POA: Diagnosis not present

## 2017-06-15 DIAGNOSIS — I129 Hypertensive chronic kidney disease with stage 1 through stage 4 chronic kidney disease, or unspecified chronic kidney disease: Secondary | ICD-10-CM | POA: Diagnosis not present

## 2017-06-15 DIAGNOSIS — N184 Chronic kidney disease, stage 4 (severe): Secondary | ICD-10-CM | POA: Diagnosis not present

## 2017-06-15 DIAGNOSIS — I69354 Hemiplegia and hemiparesis following cerebral infarction affecting left non-dominant side: Secondary | ICD-10-CM | POA: Diagnosis not present

## 2017-06-15 DIAGNOSIS — I69318 Other symptoms and signs involving cognitive functions following cerebral infarction: Secondary | ICD-10-CM | POA: Diagnosis not present

## 2017-06-15 DIAGNOSIS — D631 Anemia in chronic kidney disease: Secondary | ICD-10-CM | POA: Diagnosis not present

## 2017-06-16 DIAGNOSIS — N184 Chronic kidney disease, stage 4 (severe): Secondary | ICD-10-CM | POA: Diagnosis not present

## 2017-06-16 DIAGNOSIS — I69354 Hemiplegia and hemiparesis following cerebral infarction affecting left non-dominant side: Secondary | ICD-10-CM | POA: Diagnosis not present

## 2017-06-16 DIAGNOSIS — D631 Anemia in chronic kidney disease: Secondary | ICD-10-CM | POA: Diagnosis not present

## 2017-06-16 DIAGNOSIS — I69318 Other symptoms and signs involving cognitive functions following cerebral infarction: Secondary | ICD-10-CM | POA: Diagnosis not present

## 2017-06-16 DIAGNOSIS — F0150 Vascular dementia without behavioral disturbance: Secondary | ICD-10-CM | POA: Diagnosis not present

## 2017-06-16 DIAGNOSIS — I129 Hypertensive chronic kidney disease with stage 1 through stage 4 chronic kidney disease, or unspecified chronic kidney disease: Secondary | ICD-10-CM | POA: Diagnosis not present

## 2017-06-17 DIAGNOSIS — N184 Chronic kidney disease, stage 4 (severe): Secondary | ICD-10-CM | POA: Diagnosis not present

## 2017-06-17 DIAGNOSIS — I69318 Other symptoms and signs involving cognitive functions following cerebral infarction: Secondary | ICD-10-CM | POA: Diagnosis not present

## 2017-06-17 DIAGNOSIS — I69354 Hemiplegia and hemiparesis following cerebral infarction affecting left non-dominant side: Secondary | ICD-10-CM | POA: Diagnosis not present

## 2017-06-17 DIAGNOSIS — D631 Anemia in chronic kidney disease: Secondary | ICD-10-CM | POA: Diagnosis not present

## 2017-06-17 DIAGNOSIS — F0150 Vascular dementia without behavioral disturbance: Secondary | ICD-10-CM | POA: Diagnosis not present

## 2017-06-17 DIAGNOSIS — I129 Hypertensive chronic kidney disease with stage 1 through stage 4 chronic kidney disease, or unspecified chronic kidney disease: Secondary | ICD-10-CM | POA: Diagnosis not present

## 2017-06-20 DIAGNOSIS — I69354 Hemiplegia and hemiparesis following cerebral infarction affecting left non-dominant side: Secondary | ICD-10-CM | POA: Diagnosis not present

## 2017-06-20 DIAGNOSIS — I129 Hypertensive chronic kidney disease with stage 1 through stage 4 chronic kidney disease, or unspecified chronic kidney disease: Secondary | ICD-10-CM | POA: Diagnosis not present

## 2017-06-20 DIAGNOSIS — F0150 Vascular dementia without behavioral disturbance: Secondary | ICD-10-CM | POA: Diagnosis not present

## 2017-06-20 DIAGNOSIS — D631 Anemia in chronic kidney disease: Secondary | ICD-10-CM | POA: Diagnosis not present

## 2017-06-20 DIAGNOSIS — I69318 Other symptoms and signs involving cognitive functions following cerebral infarction: Secondary | ICD-10-CM | POA: Diagnosis not present

## 2017-06-20 DIAGNOSIS — N184 Chronic kidney disease, stage 4 (severe): Secondary | ICD-10-CM | POA: Diagnosis not present

## 2017-06-21 DIAGNOSIS — I69318 Other symptoms and signs involving cognitive functions following cerebral infarction: Secondary | ICD-10-CM | POA: Diagnosis not present

## 2017-06-21 DIAGNOSIS — F0150 Vascular dementia without behavioral disturbance: Secondary | ICD-10-CM | POA: Diagnosis not present

## 2017-06-21 DIAGNOSIS — I129 Hypertensive chronic kidney disease with stage 1 through stage 4 chronic kidney disease, or unspecified chronic kidney disease: Secondary | ICD-10-CM | POA: Diagnosis not present

## 2017-06-21 DIAGNOSIS — D631 Anemia in chronic kidney disease: Secondary | ICD-10-CM | POA: Diagnosis not present

## 2017-06-21 DIAGNOSIS — N184 Chronic kidney disease, stage 4 (severe): Secondary | ICD-10-CM | POA: Diagnosis not present

## 2017-06-21 DIAGNOSIS — I69354 Hemiplegia and hemiparesis following cerebral infarction affecting left non-dominant side: Secondary | ICD-10-CM | POA: Diagnosis not present

## 2017-06-22 DIAGNOSIS — R627 Adult failure to thrive: Secondary | ICD-10-CM | POA: Diagnosis not present

## 2017-06-22 DIAGNOSIS — K921 Melena: Secondary | ICD-10-CM | POA: Diagnosis not present

## 2017-06-22 DIAGNOSIS — E785 Hyperlipidemia, unspecified: Secondary | ICD-10-CM | POA: Diagnosis not present

## 2017-06-22 DIAGNOSIS — N184 Chronic kidney disease, stage 4 (severe): Secondary | ICD-10-CM | POA: Diagnosis not present

## 2017-06-22 DIAGNOSIS — I69318 Other symptoms and signs involving cognitive functions following cerebral infarction: Secondary | ICD-10-CM | POA: Diagnosis not present

## 2017-06-22 DIAGNOSIS — H9110 Presbycusis, unspecified ear: Secondary | ICD-10-CM | POA: Diagnosis not present

## 2017-06-22 DIAGNOSIS — F329 Major depressive disorder, single episode, unspecified: Secondary | ICD-10-CM | POA: Diagnosis not present

## 2017-06-22 DIAGNOSIS — I129 Hypertensive chronic kidney disease with stage 1 through stage 4 chronic kidney disease, or unspecified chronic kidney disease: Secondary | ICD-10-CM | POA: Diagnosis not present

## 2017-06-22 DIAGNOSIS — J309 Allergic rhinitis, unspecified: Secondary | ICD-10-CM | POA: Diagnosis not present

## 2017-06-22 DIAGNOSIS — H3530 Unspecified macular degeneration: Secondary | ICD-10-CM | POA: Diagnosis not present

## 2017-06-22 DIAGNOSIS — R011 Cardiac murmur, unspecified: Secondary | ICD-10-CM | POA: Diagnosis not present

## 2017-06-22 DIAGNOSIS — M1711 Unilateral primary osteoarthritis, right knee: Secondary | ICD-10-CM | POA: Diagnosis not present

## 2017-06-22 DIAGNOSIS — I69354 Hemiplegia and hemiparesis following cerebral infarction affecting left non-dominant side: Secondary | ICD-10-CM | POA: Diagnosis not present

## 2017-06-22 DIAGNOSIS — F0150 Vascular dementia without behavioral disturbance: Secondary | ICD-10-CM | POA: Diagnosis not present

## 2017-06-22 DIAGNOSIS — F411 Generalized anxiety disorder: Secondary | ICD-10-CM | POA: Diagnosis not present

## 2017-06-22 DIAGNOSIS — R634 Abnormal weight loss: Secondary | ICD-10-CM | POA: Diagnosis not present

## 2017-06-22 DIAGNOSIS — D631 Anemia in chronic kidney disease: Secondary | ICD-10-CM | POA: Diagnosis not present

## 2017-06-23 DIAGNOSIS — I69318 Other symptoms and signs involving cognitive functions following cerebral infarction: Secondary | ICD-10-CM | POA: Diagnosis not present

## 2017-06-23 DIAGNOSIS — N184 Chronic kidney disease, stage 4 (severe): Secondary | ICD-10-CM | POA: Diagnosis not present

## 2017-06-23 DIAGNOSIS — I129 Hypertensive chronic kidney disease with stage 1 through stage 4 chronic kidney disease, or unspecified chronic kidney disease: Secondary | ICD-10-CM | POA: Diagnosis not present

## 2017-06-23 DIAGNOSIS — F0150 Vascular dementia without behavioral disturbance: Secondary | ICD-10-CM | POA: Diagnosis not present

## 2017-06-23 DIAGNOSIS — D631 Anemia in chronic kidney disease: Secondary | ICD-10-CM | POA: Diagnosis not present

## 2017-06-23 DIAGNOSIS — I69354 Hemiplegia and hemiparesis following cerebral infarction affecting left non-dominant side: Secondary | ICD-10-CM | POA: Diagnosis not present

## 2017-06-24 DIAGNOSIS — I69354 Hemiplegia and hemiparesis following cerebral infarction affecting left non-dominant side: Secondary | ICD-10-CM | POA: Diagnosis not present

## 2017-06-24 DIAGNOSIS — N184 Chronic kidney disease, stage 4 (severe): Secondary | ICD-10-CM | POA: Diagnosis not present

## 2017-06-24 DIAGNOSIS — F0150 Vascular dementia without behavioral disturbance: Secondary | ICD-10-CM | POA: Diagnosis not present

## 2017-06-24 DIAGNOSIS — I69318 Other symptoms and signs involving cognitive functions following cerebral infarction: Secondary | ICD-10-CM | POA: Diagnosis not present

## 2017-06-24 DIAGNOSIS — D631 Anemia in chronic kidney disease: Secondary | ICD-10-CM | POA: Diagnosis not present

## 2017-06-24 DIAGNOSIS — I129 Hypertensive chronic kidney disease with stage 1 through stage 4 chronic kidney disease, or unspecified chronic kidney disease: Secondary | ICD-10-CM | POA: Diagnosis not present

## 2017-06-27 DIAGNOSIS — I69318 Other symptoms and signs involving cognitive functions following cerebral infarction: Secondary | ICD-10-CM | POA: Diagnosis not present

## 2017-06-27 DIAGNOSIS — I129 Hypertensive chronic kidney disease with stage 1 through stage 4 chronic kidney disease, or unspecified chronic kidney disease: Secondary | ICD-10-CM | POA: Diagnosis not present

## 2017-06-27 DIAGNOSIS — I69354 Hemiplegia and hemiparesis following cerebral infarction affecting left non-dominant side: Secondary | ICD-10-CM | POA: Diagnosis not present

## 2017-06-27 DIAGNOSIS — D631 Anemia in chronic kidney disease: Secondary | ICD-10-CM | POA: Diagnosis not present

## 2017-06-27 DIAGNOSIS — N184 Chronic kidney disease, stage 4 (severe): Secondary | ICD-10-CM | POA: Diagnosis not present

## 2017-06-27 DIAGNOSIS — F0150 Vascular dementia without behavioral disturbance: Secondary | ICD-10-CM | POA: Diagnosis not present

## 2017-06-28 DIAGNOSIS — I69318 Other symptoms and signs involving cognitive functions following cerebral infarction: Secondary | ICD-10-CM | POA: Diagnosis not present

## 2017-06-28 DIAGNOSIS — I69354 Hemiplegia and hemiparesis following cerebral infarction affecting left non-dominant side: Secondary | ICD-10-CM | POA: Diagnosis not present

## 2017-06-28 DIAGNOSIS — D631 Anemia in chronic kidney disease: Secondary | ICD-10-CM | POA: Diagnosis not present

## 2017-06-28 DIAGNOSIS — I129 Hypertensive chronic kidney disease with stage 1 through stage 4 chronic kidney disease, or unspecified chronic kidney disease: Secondary | ICD-10-CM | POA: Diagnosis not present

## 2017-06-28 DIAGNOSIS — N184 Chronic kidney disease, stage 4 (severe): Secondary | ICD-10-CM | POA: Diagnosis not present

## 2017-06-28 DIAGNOSIS — F0150 Vascular dementia without behavioral disturbance: Secondary | ICD-10-CM | POA: Diagnosis not present

## 2017-06-29 DIAGNOSIS — F0150 Vascular dementia without behavioral disturbance: Secondary | ICD-10-CM | POA: Diagnosis not present

## 2017-06-29 DIAGNOSIS — D631 Anemia in chronic kidney disease: Secondary | ICD-10-CM | POA: Diagnosis not present

## 2017-06-29 DIAGNOSIS — I129 Hypertensive chronic kidney disease with stage 1 through stage 4 chronic kidney disease, or unspecified chronic kidney disease: Secondary | ICD-10-CM | POA: Diagnosis not present

## 2017-06-29 DIAGNOSIS — I69354 Hemiplegia and hemiparesis following cerebral infarction affecting left non-dominant side: Secondary | ICD-10-CM | POA: Diagnosis not present

## 2017-06-29 DIAGNOSIS — I69318 Other symptoms and signs involving cognitive functions following cerebral infarction: Secondary | ICD-10-CM | POA: Diagnosis not present

## 2017-06-29 DIAGNOSIS — N184 Chronic kidney disease, stage 4 (severe): Secondary | ICD-10-CM | POA: Diagnosis not present

## 2017-06-30 DIAGNOSIS — I69318 Other symptoms and signs involving cognitive functions following cerebral infarction: Secondary | ICD-10-CM | POA: Diagnosis not present

## 2017-06-30 DIAGNOSIS — F0150 Vascular dementia without behavioral disturbance: Secondary | ICD-10-CM | POA: Diagnosis not present

## 2017-06-30 DIAGNOSIS — I129 Hypertensive chronic kidney disease with stage 1 through stage 4 chronic kidney disease, or unspecified chronic kidney disease: Secondary | ICD-10-CM | POA: Diagnosis not present

## 2017-06-30 DIAGNOSIS — D631 Anemia in chronic kidney disease: Secondary | ICD-10-CM | POA: Diagnosis not present

## 2017-06-30 DIAGNOSIS — I69354 Hemiplegia and hemiparesis following cerebral infarction affecting left non-dominant side: Secondary | ICD-10-CM | POA: Diagnosis not present

## 2017-06-30 DIAGNOSIS — N184 Chronic kidney disease, stage 4 (severe): Secondary | ICD-10-CM | POA: Diagnosis not present

## 2017-07-01 DIAGNOSIS — I69354 Hemiplegia and hemiparesis following cerebral infarction affecting left non-dominant side: Secondary | ICD-10-CM | POA: Diagnosis not present

## 2017-07-01 DIAGNOSIS — D631 Anemia in chronic kidney disease: Secondary | ICD-10-CM | POA: Diagnosis not present

## 2017-07-01 DIAGNOSIS — N184 Chronic kidney disease, stage 4 (severe): Secondary | ICD-10-CM | POA: Diagnosis not present

## 2017-07-01 DIAGNOSIS — F0150 Vascular dementia without behavioral disturbance: Secondary | ICD-10-CM | POA: Diagnosis not present

## 2017-07-01 DIAGNOSIS — I129 Hypertensive chronic kidney disease with stage 1 through stage 4 chronic kidney disease, or unspecified chronic kidney disease: Secondary | ICD-10-CM | POA: Diagnosis not present

## 2017-07-01 DIAGNOSIS — I69318 Other symptoms and signs involving cognitive functions following cerebral infarction: Secondary | ICD-10-CM | POA: Diagnosis not present

## 2017-07-04 DIAGNOSIS — N184 Chronic kidney disease, stage 4 (severe): Secondary | ICD-10-CM | POA: Diagnosis not present

## 2017-07-04 DIAGNOSIS — I69318 Other symptoms and signs involving cognitive functions following cerebral infarction: Secondary | ICD-10-CM | POA: Diagnosis not present

## 2017-07-04 DIAGNOSIS — I69354 Hemiplegia and hemiparesis following cerebral infarction affecting left non-dominant side: Secondary | ICD-10-CM | POA: Diagnosis not present

## 2017-07-04 DIAGNOSIS — F0150 Vascular dementia without behavioral disturbance: Secondary | ICD-10-CM | POA: Diagnosis not present

## 2017-07-04 DIAGNOSIS — D631 Anemia in chronic kidney disease: Secondary | ICD-10-CM | POA: Diagnosis not present

## 2017-07-04 DIAGNOSIS — I129 Hypertensive chronic kidney disease with stage 1 through stage 4 chronic kidney disease, or unspecified chronic kidney disease: Secondary | ICD-10-CM | POA: Diagnosis not present

## 2017-07-05 DIAGNOSIS — I69354 Hemiplegia and hemiparesis following cerebral infarction affecting left non-dominant side: Secondary | ICD-10-CM | POA: Diagnosis not present

## 2017-07-05 DIAGNOSIS — N184 Chronic kidney disease, stage 4 (severe): Secondary | ICD-10-CM | POA: Diagnosis not present

## 2017-07-05 DIAGNOSIS — I69318 Other symptoms and signs involving cognitive functions following cerebral infarction: Secondary | ICD-10-CM | POA: Diagnosis not present

## 2017-07-05 DIAGNOSIS — D631 Anemia in chronic kidney disease: Secondary | ICD-10-CM | POA: Diagnosis not present

## 2017-07-05 DIAGNOSIS — I129 Hypertensive chronic kidney disease with stage 1 through stage 4 chronic kidney disease, or unspecified chronic kidney disease: Secondary | ICD-10-CM | POA: Diagnosis not present

## 2017-07-05 DIAGNOSIS — F0150 Vascular dementia without behavioral disturbance: Secondary | ICD-10-CM | POA: Diagnosis not present

## 2017-07-06 DIAGNOSIS — I129 Hypertensive chronic kidney disease with stage 1 through stage 4 chronic kidney disease, or unspecified chronic kidney disease: Secondary | ICD-10-CM | POA: Diagnosis not present

## 2017-07-06 DIAGNOSIS — F0150 Vascular dementia without behavioral disturbance: Secondary | ICD-10-CM | POA: Diagnosis not present

## 2017-07-06 DIAGNOSIS — N184 Chronic kidney disease, stage 4 (severe): Secondary | ICD-10-CM | POA: Diagnosis not present

## 2017-07-06 DIAGNOSIS — I69354 Hemiplegia and hemiparesis following cerebral infarction affecting left non-dominant side: Secondary | ICD-10-CM | POA: Diagnosis not present

## 2017-07-06 DIAGNOSIS — I69318 Other symptoms and signs involving cognitive functions following cerebral infarction: Secondary | ICD-10-CM | POA: Diagnosis not present

## 2017-07-06 DIAGNOSIS — D631 Anemia in chronic kidney disease: Secondary | ICD-10-CM | POA: Diagnosis not present

## 2017-07-07 DIAGNOSIS — I69354 Hemiplegia and hemiparesis following cerebral infarction affecting left non-dominant side: Secondary | ICD-10-CM | POA: Diagnosis not present

## 2017-07-07 DIAGNOSIS — N184 Chronic kidney disease, stage 4 (severe): Secondary | ICD-10-CM | POA: Diagnosis not present

## 2017-07-07 DIAGNOSIS — I69318 Other symptoms and signs involving cognitive functions following cerebral infarction: Secondary | ICD-10-CM | POA: Diagnosis not present

## 2017-07-07 DIAGNOSIS — D631 Anemia in chronic kidney disease: Secondary | ICD-10-CM | POA: Diagnosis not present

## 2017-07-07 DIAGNOSIS — I129 Hypertensive chronic kidney disease with stage 1 through stage 4 chronic kidney disease, or unspecified chronic kidney disease: Secondary | ICD-10-CM | POA: Diagnosis not present

## 2017-07-07 DIAGNOSIS — F0150 Vascular dementia without behavioral disturbance: Secondary | ICD-10-CM | POA: Diagnosis not present

## 2017-07-08 DIAGNOSIS — D631 Anemia in chronic kidney disease: Secondary | ICD-10-CM | POA: Diagnosis not present

## 2017-07-08 DIAGNOSIS — I69354 Hemiplegia and hemiparesis following cerebral infarction affecting left non-dominant side: Secondary | ICD-10-CM | POA: Diagnosis not present

## 2017-07-08 DIAGNOSIS — F0150 Vascular dementia without behavioral disturbance: Secondary | ICD-10-CM | POA: Diagnosis not present

## 2017-07-08 DIAGNOSIS — I129 Hypertensive chronic kidney disease with stage 1 through stage 4 chronic kidney disease, or unspecified chronic kidney disease: Secondary | ICD-10-CM | POA: Diagnosis not present

## 2017-07-08 DIAGNOSIS — N184 Chronic kidney disease, stage 4 (severe): Secondary | ICD-10-CM | POA: Diagnosis not present

## 2017-07-08 DIAGNOSIS — I69318 Other symptoms and signs involving cognitive functions following cerebral infarction: Secondary | ICD-10-CM | POA: Diagnosis not present

## 2017-07-11 DIAGNOSIS — I69354 Hemiplegia and hemiparesis following cerebral infarction affecting left non-dominant side: Secondary | ICD-10-CM | POA: Diagnosis not present

## 2017-07-11 DIAGNOSIS — I129 Hypertensive chronic kidney disease with stage 1 through stage 4 chronic kidney disease, or unspecified chronic kidney disease: Secondary | ICD-10-CM | POA: Diagnosis not present

## 2017-07-11 DIAGNOSIS — D631 Anemia in chronic kidney disease: Secondary | ICD-10-CM | POA: Diagnosis not present

## 2017-07-11 DIAGNOSIS — I69318 Other symptoms and signs involving cognitive functions following cerebral infarction: Secondary | ICD-10-CM | POA: Diagnosis not present

## 2017-07-11 DIAGNOSIS — F0150 Vascular dementia without behavioral disturbance: Secondary | ICD-10-CM | POA: Diagnosis not present

## 2017-07-11 DIAGNOSIS — N184 Chronic kidney disease, stage 4 (severe): Secondary | ICD-10-CM | POA: Diagnosis not present

## 2017-07-12 DIAGNOSIS — D631 Anemia in chronic kidney disease: Secondary | ICD-10-CM | POA: Diagnosis not present

## 2017-07-12 DIAGNOSIS — I129 Hypertensive chronic kidney disease with stage 1 through stage 4 chronic kidney disease, or unspecified chronic kidney disease: Secondary | ICD-10-CM | POA: Diagnosis not present

## 2017-07-12 DIAGNOSIS — F0150 Vascular dementia without behavioral disturbance: Secondary | ICD-10-CM | POA: Diagnosis not present

## 2017-07-12 DIAGNOSIS — I69318 Other symptoms and signs involving cognitive functions following cerebral infarction: Secondary | ICD-10-CM | POA: Diagnosis not present

## 2017-07-12 DIAGNOSIS — I69354 Hemiplegia and hemiparesis following cerebral infarction affecting left non-dominant side: Secondary | ICD-10-CM | POA: Diagnosis not present

## 2017-07-12 DIAGNOSIS — N184 Chronic kidney disease, stage 4 (severe): Secondary | ICD-10-CM | POA: Diagnosis not present

## 2017-07-13 DIAGNOSIS — D631 Anemia in chronic kidney disease: Secondary | ICD-10-CM | POA: Diagnosis not present

## 2017-07-13 DIAGNOSIS — F0150 Vascular dementia without behavioral disturbance: Secondary | ICD-10-CM | POA: Diagnosis not present

## 2017-07-13 DIAGNOSIS — I129 Hypertensive chronic kidney disease with stage 1 through stage 4 chronic kidney disease, or unspecified chronic kidney disease: Secondary | ICD-10-CM | POA: Diagnosis not present

## 2017-07-13 DIAGNOSIS — N184 Chronic kidney disease, stage 4 (severe): Secondary | ICD-10-CM | POA: Diagnosis not present

## 2017-07-13 DIAGNOSIS — I69354 Hemiplegia and hemiparesis following cerebral infarction affecting left non-dominant side: Secondary | ICD-10-CM | POA: Diagnosis not present

## 2017-07-13 DIAGNOSIS — I69318 Other symptoms and signs involving cognitive functions following cerebral infarction: Secondary | ICD-10-CM | POA: Diagnosis not present

## 2017-07-14 DIAGNOSIS — I129 Hypertensive chronic kidney disease with stage 1 through stage 4 chronic kidney disease, or unspecified chronic kidney disease: Secondary | ICD-10-CM | POA: Diagnosis not present

## 2017-07-14 DIAGNOSIS — N184 Chronic kidney disease, stage 4 (severe): Secondary | ICD-10-CM | POA: Diagnosis not present

## 2017-07-14 DIAGNOSIS — I69354 Hemiplegia and hemiparesis following cerebral infarction affecting left non-dominant side: Secondary | ICD-10-CM | POA: Diagnosis not present

## 2017-07-14 DIAGNOSIS — D631 Anemia in chronic kidney disease: Secondary | ICD-10-CM | POA: Diagnosis not present

## 2017-07-14 DIAGNOSIS — I69318 Other symptoms and signs involving cognitive functions following cerebral infarction: Secondary | ICD-10-CM | POA: Diagnosis not present

## 2017-07-14 DIAGNOSIS — F0150 Vascular dementia without behavioral disturbance: Secondary | ICD-10-CM | POA: Diagnosis not present

## 2017-07-15 DIAGNOSIS — N184 Chronic kidney disease, stage 4 (severe): Secondary | ICD-10-CM | POA: Diagnosis not present

## 2017-07-15 DIAGNOSIS — I69354 Hemiplegia and hemiparesis following cerebral infarction affecting left non-dominant side: Secondary | ICD-10-CM | POA: Diagnosis not present

## 2017-07-15 DIAGNOSIS — F0150 Vascular dementia without behavioral disturbance: Secondary | ICD-10-CM | POA: Diagnosis not present

## 2017-07-15 DIAGNOSIS — I129 Hypertensive chronic kidney disease with stage 1 through stage 4 chronic kidney disease, or unspecified chronic kidney disease: Secondary | ICD-10-CM | POA: Diagnosis not present

## 2017-07-15 DIAGNOSIS — I69318 Other symptoms and signs involving cognitive functions following cerebral infarction: Secondary | ICD-10-CM | POA: Diagnosis not present

## 2017-07-15 DIAGNOSIS — D631 Anemia in chronic kidney disease: Secondary | ICD-10-CM | POA: Diagnosis not present

## 2017-07-18 DIAGNOSIS — D631 Anemia in chronic kidney disease: Secondary | ICD-10-CM | POA: Diagnosis not present

## 2017-07-18 DIAGNOSIS — N184 Chronic kidney disease, stage 4 (severe): Secondary | ICD-10-CM | POA: Diagnosis not present

## 2017-07-18 DIAGNOSIS — I69354 Hemiplegia and hemiparesis following cerebral infarction affecting left non-dominant side: Secondary | ICD-10-CM | POA: Diagnosis not present

## 2017-07-18 DIAGNOSIS — I129 Hypertensive chronic kidney disease with stage 1 through stage 4 chronic kidney disease, or unspecified chronic kidney disease: Secondary | ICD-10-CM | POA: Diagnosis not present

## 2017-07-18 DIAGNOSIS — I69318 Other symptoms and signs involving cognitive functions following cerebral infarction: Secondary | ICD-10-CM | POA: Diagnosis not present

## 2017-07-18 DIAGNOSIS — F0150 Vascular dementia without behavioral disturbance: Secondary | ICD-10-CM | POA: Diagnosis not present

## 2017-07-19 DIAGNOSIS — F0150 Vascular dementia without behavioral disturbance: Secondary | ICD-10-CM | POA: Diagnosis not present

## 2017-07-19 DIAGNOSIS — N184 Chronic kidney disease, stage 4 (severe): Secondary | ICD-10-CM | POA: Diagnosis not present

## 2017-07-19 DIAGNOSIS — I69354 Hemiplegia and hemiparesis following cerebral infarction affecting left non-dominant side: Secondary | ICD-10-CM | POA: Diagnosis not present

## 2017-07-19 DIAGNOSIS — I129 Hypertensive chronic kidney disease with stage 1 through stage 4 chronic kidney disease, or unspecified chronic kidney disease: Secondary | ICD-10-CM | POA: Diagnosis not present

## 2017-07-19 DIAGNOSIS — I69318 Other symptoms and signs involving cognitive functions following cerebral infarction: Secondary | ICD-10-CM | POA: Diagnosis not present

## 2017-07-19 DIAGNOSIS — D631 Anemia in chronic kidney disease: Secondary | ICD-10-CM | POA: Diagnosis not present

## 2017-07-20 DIAGNOSIS — I69354 Hemiplegia and hemiparesis following cerebral infarction affecting left non-dominant side: Secondary | ICD-10-CM | POA: Diagnosis not present

## 2017-07-20 DIAGNOSIS — N184 Chronic kidney disease, stage 4 (severe): Secondary | ICD-10-CM | POA: Diagnosis not present

## 2017-07-20 DIAGNOSIS — F0150 Vascular dementia without behavioral disturbance: Secondary | ICD-10-CM | POA: Diagnosis not present

## 2017-07-20 DIAGNOSIS — I129 Hypertensive chronic kidney disease with stage 1 through stage 4 chronic kidney disease, or unspecified chronic kidney disease: Secondary | ICD-10-CM | POA: Diagnosis not present

## 2017-07-20 DIAGNOSIS — D631 Anemia in chronic kidney disease: Secondary | ICD-10-CM | POA: Diagnosis not present

## 2017-07-20 DIAGNOSIS — I69318 Other symptoms and signs involving cognitive functions following cerebral infarction: Secondary | ICD-10-CM | POA: Diagnosis not present

## 2017-07-21 DIAGNOSIS — D631 Anemia in chronic kidney disease: Secondary | ICD-10-CM | POA: Diagnosis not present

## 2017-07-21 DIAGNOSIS — I69354 Hemiplegia and hemiparesis following cerebral infarction affecting left non-dominant side: Secondary | ICD-10-CM | POA: Diagnosis not present

## 2017-07-21 DIAGNOSIS — I129 Hypertensive chronic kidney disease with stage 1 through stage 4 chronic kidney disease, or unspecified chronic kidney disease: Secondary | ICD-10-CM | POA: Diagnosis not present

## 2017-07-21 DIAGNOSIS — N184 Chronic kidney disease, stage 4 (severe): Secondary | ICD-10-CM | POA: Diagnosis not present

## 2017-07-21 DIAGNOSIS — I69318 Other symptoms and signs involving cognitive functions following cerebral infarction: Secondary | ICD-10-CM | POA: Diagnosis not present

## 2017-07-21 DIAGNOSIS — F0150 Vascular dementia without behavioral disturbance: Secondary | ICD-10-CM | POA: Diagnosis not present

## 2017-07-22 DIAGNOSIS — I129 Hypertensive chronic kidney disease with stage 1 through stage 4 chronic kidney disease, or unspecified chronic kidney disease: Secondary | ICD-10-CM | POA: Diagnosis not present

## 2017-07-22 DIAGNOSIS — I69354 Hemiplegia and hemiparesis following cerebral infarction affecting left non-dominant side: Secondary | ICD-10-CM | POA: Diagnosis not present

## 2017-07-22 DIAGNOSIS — D631 Anemia in chronic kidney disease: Secondary | ICD-10-CM | POA: Diagnosis not present

## 2017-07-22 DIAGNOSIS — I69318 Other symptoms and signs involving cognitive functions following cerebral infarction: Secondary | ICD-10-CM | POA: Diagnosis not present

## 2017-07-22 DIAGNOSIS — F0150 Vascular dementia without behavioral disturbance: Secondary | ICD-10-CM | POA: Diagnosis not present

## 2017-07-22 DIAGNOSIS — N184 Chronic kidney disease, stage 4 (severe): Secondary | ICD-10-CM | POA: Diagnosis not present

## 2017-07-23 DIAGNOSIS — R011 Cardiac murmur, unspecified: Secondary | ICD-10-CM | POA: Diagnosis not present

## 2017-07-23 DIAGNOSIS — Z9981 Dependence on supplemental oxygen: Secondary | ICD-10-CM | POA: Diagnosis not present

## 2017-07-23 DIAGNOSIS — I69354 Hemiplegia and hemiparesis following cerebral infarction affecting left non-dominant side: Secondary | ICD-10-CM | POA: Diagnosis not present

## 2017-07-23 DIAGNOSIS — I129 Hypertensive chronic kidney disease with stage 1 through stage 4 chronic kidney disease, or unspecified chronic kidney disease: Secondary | ICD-10-CM | POA: Diagnosis not present

## 2017-07-23 DIAGNOSIS — H3530 Unspecified macular degeneration: Secondary | ICD-10-CM | POA: Diagnosis not present

## 2017-07-23 DIAGNOSIS — F329 Major depressive disorder, single episode, unspecified: Secondary | ICD-10-CM | POA: Diagnosis not present

## 2017-07-23 DIAGNOSIS — D631 Anemia in chronic kidney disease: Secondary | ICD-10-CM | POA: Diagnosis not present

## 2017-07-23 DIAGNOSIS — E785 Hyperlipidemia, unspecified: Secondary | ICD-10-CM | POA: Diagnosis not present

## 2017-07-23 DIAGNOSIS — R627 Adult failure to thrive: Secondary | ICD-10-CM | POA: Diagnosis not present

## 2017-07-23 DIAGNOSIS — R634 Abnormal weight loss: Secondary | ICD-10-CM | POA: Diagnosis not present

## 2017-07-23 DIAGNOSIS — F411 Generalized anxiety disorder: Secondary | ICD-10-CM | POA: Diagnosis not present

## 2017-07-23 DIAGNOSIS — M1711 Unilateral primary osteoarthritis, right knee: Secondary | ICD-10-CM | POA: Diagnosis not present

## 2017-07-23 DIAGNOSIS — F0150 Vascular dementia without behavioral disturbance: Secondary | ICD-10-CM | POA: Diagnosis not present

## 2017-07-23 DIAGNOSIS — I69318 Other symptoms and signs involving cognitive functions following cerebral infarction: Secondary | ICD-10-CM | POA: Diagnosis not present

## 2017-07-23 DIAGNOSIS — N184 Chronic kidney disease, stage 4 (severe): Secondary | ICD-10-CM | POA: Diagnosis not present

## 2017-07-23 DIAGNOSIS — H9110 Presbycusis, unspecified ear: Secondary | ICD-10-CM | POA: Diagnosis not present

## 2017-07-23 DIAGNOSIS — K921 Melena: Secondary | ICD-10-CM | POA: Diagnosis not present

## 2017-07-23 DIAGNOSIS — W19XXXD Unspecified fall, subsequent encounter: Secondary | ICD-10-CM | POA: Diagnosis not present

## 2017-07-23 DIAGNOSIS — J309 Allergic rhinitis, unspecified: Secondary | ICD-10-CM | POA: Diagnosis not present

## 2017-07-24 DIAGNOSIS — I69354 Hemiplegia and hemiparesis following cerebral infarction affecting left non-dominant side: Secondary | ICD-10-CM | POA: Diagnosis not present

## 2017-07-24 DIAGNOSIS — I129 Hypertensive chronic kidney disease with stage 1 through stage 4 chronic kidney disease, or unspecified chronic kidney disease: Secondary | ICD-10-CM | POA: Diagnosis not present

## 2017-07-24 DIAGNOSIS — D631 Anemia in chronic kidney disease: Secondary | ICD-10-CM | POA: Diagnosis not present

## 2017-07-24 DIAGNOSIS — F0150 Vascular dementia without behavioral disturbance: Secondary | ICD-10-CM | POA: Diagnosis not present

## 2017-07-24 DIAGNOSIS — N184 Chronic kidney disease, stage 4 (severe): Secondary | ICD-10-CM | POA: Diagnosis not present

## 2017-07-24 DIAGNOSIS — I69318 Other symptoms and signs involving cognitive functions following cerebral infarction: Secondary | ICD-10-CM | POA: Diagnosis not present

## 2017-07-25 DIAGNOSIS — F0150 Vascular dementia without behavioral disturbance: Secondary | ICD-10-CM | POA: Diagnosis not present

## 2017-07-25 DIAGNOSIS — I69318 Other symptoms and signs involving cognitive functions following cerebral infarction: Secondary | ICD-10-CM | POA: Diagnosis not present

## 2017-07-25 DIAGNOSIS — D631 Anemia in chronic kidney disease: Secondary | ICD-10-CM | POA: Diagnosis not present

## 2017-07-25 DIAGNOSIS — I129 Hypertensive chronic kidney disease with stage 1 through stage 4 chronic kidney disease, or unspecified chronic kidney disease: Secondary | ICD-10-CM | POA: Diagnosis not present

## 2017-07-25 DIAGNOSIS — I69354 Hemiplegia and hemiparesis following cerebral infarction affecting left non-dominant side: Secondary | ICD-10-CM | POA: Diagnosis not present

## 2017-07-25 DIAGNOSIS — N184 Chronic kidney disease, stage 4 (severe): Secondary | ICD-10-CM | POA: Diagnosis not present

## 2017-07-26 DIAGNOSIS — I69354 Hemiplegia and hemiparesis following cerebral infarction affecting left non-dominant side: Secondary | ICD-10-CM | POA: Diagnosis not present

## 2017-07-26 DIAGNOSIS — I69318 Other symptoms and signs involving cognitive functions following cerebral infarction: Secondary | ICD-10-CM | POA: Diagnosis not present

## 2017-07-26 DIAGNOSIS — D631 Anemia in chronic kidney disease: Secondary | ICD-10-CM | POA: Diagnosis not present

## 2017-07-26 DIAGNOSIS — I129 Hypertensive chronic kidney disease with stage 1 through stage 4 chronic kidney disease, or unspecified chronic kidney disease: Secondary | ICD-10-CM | POA: Diagnosis not present

## 2017-07-26 DIAGNOSIS — F0150 Vascular dementia without behavioral disturbance: Secondary | ICD-10-CM | POA: Diagnosis not present

## 2017-07-26 DIAGNOSIS — N184 Chronic kidney disease, stage 4 (severe): Secondary | ICD-10-CM | POA: Diagnosis not present

## 2017-07-27 DIAGNOSIS — I69354 Hemiplegia and hemiparesis following cerebral infarction affecting left non-dominant side: Secondary | ICD-10-CM | POA: Diagnosis not present

## 2017-07-27 DIAGNOSIS — N184 Chronic kidney disease, stage 4 (severe): Secondary | ICD-10-CM | POA: Diagnosis not present

## 2017-07-27 DIAGNOSIS — I129 Hypertensive chronic kidney disease with stage 1 through stage 4 chronic kidney disease, or unspecified chronic kidney disease: Secondary | ICD-10-CM | POA: Diagnosis not present

## 2017-07-27 DIAGNOSIS — F0150 Vascular dementia without behavioral disturbance: Secondary | ICD-10-CM | POA: Diagnosis not present

## 2017-07-27 DIAGNOSIS — I69318 Other symptoms and signs involving cognitive functions following cerebral infarction: Secondary | ICD-10-CM | POA: Diagnosis not present

## 2017-07-27 DIAGNOSIS — D631 Anemia in chronic kidney disease: Secondary | ICD-10-CM | POA: Diagnosis not present

## 2017-07-28 DIAGNOSIS — N184 Chronic kidney disease, stage 4 (severe): Secondary | ICD-10-CM | POA: Diagnosis not present

## 2017-07-28 DIAGNOSIS — F0150 Vascular dementia without behavioral disturbance: Secondary | ICD-10-CM | POA: Diagnosis not present

## 2017-07-28 DIAGNOSIS — I129 Hypertensive chronic kidney disease with stage 1 through stage 4 chronic kidney disease, or unspecified chronic kidney disease: Secondary | ICD-10-CM | POA: Diagnosis not present

## 2017-07-28 DIAGNOSIS — I69354 Hemiplegia and hemiparesis following cerebral infarction affecting left non-dominant side: Secondary | ICD-10-CM | POA: Diagnosis not present

## 2017-07-28 DIAGNOSIS — I69318 Other symptoms and signs involving cognitive functions following cerebral infarction: Secondary | ICD-10-CM | POA: Diagnosis not present

## 2017-07-28 DIAGNOSIS — D631 Anemia in chronic kidney disease: Secondary | ICD-10-CM | POA: Diagnosis not present

## 2017-07-29 DIAGNOSIS — I69354 Hemiplegia and hemiparesis following cerebral infarction affecting left non-dominant side: Secondary | ICD-10-CM | POA: Diagnosis not present

## 2017-07-29 DIAGNOSIS — N184 Chronic kidney disease, stage 4 (severe): Secondary | ICD-10-CM | POA: Diagnosis not present

## 2017-07-29 DIAGNOSIS — F0150 Vascular dementia without behavioral disturbance: Secondary | ICD-10-CM | POA: Diagnosis not present

## 2017-07-29 DIAGNOSIS — I129 Hypertensive chronic kidney disease with stage 1 through stage 4 chronic kidney disease, or unspecified chronic kidney disease: Secondary | ICD-10-CM | POA: Diagnosis not present

## 2017-07-29 DIAGNOSIS — D631 Anemia in chronic kidney disease: Secondary | ICD-10-CM | POA: Diagnosis not present

## 2017-07-29 DIAGNOSIS — I69318 Other symptoms and signs involving cognitive functions following cerebral infarction: Secondary | ICD-10-CM | POA: Diagnosis not present

## 2017-08-01 DIAGNOSIS — I69354 Hemiplegia and hemiparesis following cerebral infarction affecting left non-dominant side: Secondary | ICD-10-CM | POA: Diagnosis not present

## 2017-08-01 DIAGNOSIS — F0150 Vascular dementia without behavioral disturbance: Secondary | ICD-10-CM | POA: Diagnosis not present

## 2017-08-01 DIAGNOSIS — I69318 Other symptoms and signs involving cognitive functions following cerebral infarction: Secondary | ICD-10-CM | POA: Diagnosis not present

## 2017-08-01 DIAGNOSIS — N184 Chronic kidney disease, stage 4 (severe): Secondary | ICD-10-CM | POA: Diagnosis not present

## 2017-08-01 DIAGNOSIS — D631 Anemia in chronic kidney disease: Secondary | ICD-10-CM | POA: Diagnosis not present

## 2017-08-01 DIAGNOSIS — I129 Hypertensive chronic kidney disease with stage 1 through stage 4 chronic kidney disease, or unspecified chronic kidney disease: Secondary | ICD-10-CM | POA: Diagnosis not present

## 2017-08-02 DIAGNOSIS — I69318 Other symptoms and signs involving cognitive functions following cerebral infarction: Secondary | ICD-10-CM | POA: Diagnosis not present

## 2017-08-02 DIAGNOSIS — I69354 Hemiplegia and hemiparesis following cerebral infarction affecting left non-dominant side: Secondary | ICD-10-CM | POA: Diagnosis not present

## 2017-08-02 DIAGNOSIS — F0150 Vascular dementia without behavioral disturbance: Secondary | ICD-10-CM | POA: Diagnosis not present

## 2017-08-02 DIAGNOSIS — D631 Anemia in chronic kidney disease: Secondary | ICD-10-CM | POA: Diagnosis not present

## 2017-08-02 DIAGNOSIS — I129 Hypertensive chronic kidney disease with stage 1 through stage 4 chronic kidney disease, or unspecified chronic kidney disease: Secondary | ICD-10-CM | POA: Diagnosis not present

## 2017-08-02 DIAGNOSIS — N184 Chronic kidney disease, stage 4 (severe): Secondary | ICD-10-CM | POA: Diagnosis not present

## 2017-08-03 DIAGNOSIS — I69354 Hemiplegia and hemiparesis following cerebral infarction affecting left non-dominant side: Secondary | ICD-10-CM | POA: Diagnosis not present

## 2017-08-03 DIAGNOSIS — I69318 Other symptoms and signs involving cognitive functions following cerebral infarction: Secondary | ICD-10-CM | POA: Diagnosis not present

## 2017-08-03 DIAGNOSIS — I129 Hypertensive chronic kidney disease with stage 1 through stage 4 chronic kidney disease, or unspecified chronic kidney disease: Secondary | ICD-10-CM | POA: Diagnosis not present

## 2017-08-03 DIAGNOSIS — N184 Chronic kidney disease, stage 4 (severe): Secondary | ICD-10-CM | POA: Diagnosis not present

## 2017-08-03 DIAGNOSIS — F0150 Vascular dementia without behavioral disturbance: Secondary | ICD-10-CM | POA: Diagnosis not present

## 2017-08-03 DIAGNOSIS — D631 Anemia in chronic kidney disease: Secondary | ICD-10-CM | POA: Diagnosis not present

## 2017-08-04 DIAGNOSIS — D631 Anemia in chronic kidney disease: Secondary | ICD-10-CM | POA: Diagnosis not present

## 2017-08-04 DIAGNOSIS — N184 Chronic kidney disease, stage 4 (severe): Secondary | ICD-10-CM | POA: Diagnosis not present

## 2017-08-04 DIAGNOSIS — I69354 Hemiplegia and hemiparesis following cerebral infarction affecting left non-dominant side: Secondary | ICD-10-CM | POA: Diagnosis not present

## 2017-08-04 DIAGNOSIS — F0150 Vascular dementia without behavioral disturbance: Secondary | ICD-10-CM | POA: Diagnosis not present

## 2017-08-04 DIAGNOSIS — I129 Hypertensive chronic kidney disease with stage 1 through stage 4 chronic kidney disease, or unspecified chronic kidney disease: Secondary | ICD-10-CM | POA: Diagnosis not present

## 2017-08-04 DIAGNOSIS — I69318 Other symptoms and signs involving cognitive functions following cerebral infarction: Secondary | ICD-10-CM | POA: Diagnosis not present

## 2017-08-05 DIAGNOSIS — N184 Chronic kidney disease, stage 4 (severe): Secondary | ICD-10-CM | POA: Diagnosis not present

## 2017-08-05 DIAGNOSIS — I69354 Hemiplegia and hemiparesis following cerebral infarction affecting left non-dominant side: Secondary | ICD-10-CM | POA: Diagnosis not present

## 2017-08-05 DIAGNOSIS — D631 Anemia in chronic kidney disease: Secondary | ICD-10-CM | POA: Diagnosis not present

## 2017-08-05 DIAGNOSIS — F0150 Vascular dementia without behavioral disturbance: Secondary | ICD-10-CM | POA: Diagnosis not present

## 2017-08-05 DIAGNOSIS — I69318 Other symptoms and signs involving cognitive functions following cerebral infarction: Secondary | ICD-10-CM | POA: Diagnosis not present

## 2017-08-05 DIAGNOSIS — I129 Hypertensive chronic kidney disease with stage 1 through stage 4 chronic kidney disease, or unspecified chronic kidney disease: Secondary | ICD-10-CM | POA: Diagnosis not present

## 2017-08-08 DIAGNOSIS — I69354 Hemiplegia and hemiparesis following cerebral infarction affecting left non-dominant side: Secondary | ICD-10-CM | POA: Diagnosis not present

## 2017-08-08 DIAGNOSIS — N184 Chronic kidney disease, stage 4 (severe): Secondary | ICD-10-CM | POA: Diagnosis not present

## 2017-08-08 DIAGNOSIS — F0150 Vascular dementia without behavioral disturbance: Secondary | ICD-10-CM | POA: Diagnosis not present

## 2017-08-08 DIAGNOSIS — I129 Hypertensive chronic kidney disease with stage 1 through stage 4 chronic kidney disease, or unspecified chronic kidney disease: Secondary | ICD-10-CM | POA: Diagnosis not present

## 2017-08-08 DIAGNOSIS — D631 Anemia in chronic kidney disease: Secondary | ICD-10-CM | POA: Diagnosis not present

## 2017-08-08 DIAGNOSIS — I69318 Other symptoms and signs involving cognitive functions following cerebral infarction: Secondary | ICD-10-CM | POA: Diagnosis not present

## 2017-08-09 DIAGNOSIS — I129 Hypertensive chronic kidney disease with stage 1 through stage 4 chronic kidney disease, or unspecified chronic kidney disease: Secondary | ICD-10-CM | POA: Diagnosis not present

## 2017-08-09 DIAGNOSIS — I69354 Hemiplegia and hemiparesis following cerebral infarction affecting left non-dominant side: Secondary | ICD-10-CM | POA: Diagnosis not present

## 2017-08-09 DIAGNOSIS — D631 Anemia in chronic kidney disease: Secondary | ICD-10-CM | POA: Diagnosis not present

## 2017-08-09 DIAGNOSIS — F0150 Vascular dementia without behavioral disturbance: Secondary | ICD-10-CM | POA: Diagnosis not present

## 2017-08-09 DIAGNOSIS — I69318 Other symptoms and signs involving cognitive functions following cerebral infarction: Secondary | ICD-10-CM | POA: Diagnosis not present

## 2017-08-09 DIAGNOSIS — N184 Chronic kidney disease, stage 4 (severe): Secondary | ICD-10-CM | POA: Diagnosis not present

## 2017-08-10 DIAGNOSIS — I69354 Hemiplegia and hemiparesis following cerebral infarction affecting left non-dominant side: Secondary | ICD-10-CM | POA: Diagnosis not present

## 2017-08-10 DIAGNOSIS — I129 Hypertensive chronic kidney disease with stage 1 through stage 4 chronic kidney disease, or unspecified chronic kidney disease: Secondary | ICD-10-CM | POA: Diagnosis not present

## 2017-08-10 DIAGNOSIS — F0150 Vascular dementia without behavioral disturbance: Secondary | ICD-10-CM | POA: Diagnosis not present

## 2017-08-10 DIAGNOSIS — N184 Chronic kidney disease, stage 4 (severe): Secondary | ICD-10-CM | POA: Diagnosis not present

## 2017-08-10 DIAGNOSIS — I69318 Other symptoms and signs involving cognitive functions following cerebral infarction: Secondary | ICD-10-CM | POA: Diagnosis not present

## 2017-08-10 DIAGNOSIS — D631 Anemia in chronic kidney disease: Secondary | ICD-10-CM | POA: Diagnosis not present

## 2017-08-11 DIAGNOSIS — I69318 Other symptoms and signs involving cognitive functions following cerebral infarction: Secondary | ICD-10-CM | POA: Diagnosis not present

## 2017-08-11 DIAGNOSIS — D631 Anemia in chronic kidney disease: Secondary | ICD-10-CM | POA: Diagnosis not present

## 2017-08-11 DIAGNOSIS — I129 Hypertensive chronic kidney disease with stage 1 through stage 4 chronic kidney disease, or unspecified chronic kidney disease: Secondary | ICD-10-CM | POA: Diagnosis not present

## 2017-08-11 DIAGNOSIS — F0150 Vascular dementia without behavioral disturbance: Secondary | ICD-10-CM | POA: Diagnosis not present

## 2017-08-11 DIAGNOSIS — N184 Chronic kidney disease, stage 4 (severe): Secondary | ICD-10-CM | POA: Diagnosis not present

## 2017-08-11 DIAGNOSIS — I69354 Hemiplegia and hemiparesis following cerebral infarction affecting left non-dominant side: Secondary | ICD-10-CM | POA: Diagnosis not present

## 2017-08-12 DIAGNOSIS — F0150 Vascular dementia without behavioral disturbance: Secondary | ICD-10-CM | POA: Diagnosis not present

## 2017-08-12 DIAGNOSIS — D631 Anemia in chronic kidney disease: Secondary | ICD-10-CM | POA: Diagnosis not present

## 2017-08-12 DIAGNOSIS — I129 Hypertensive chronic kidney disease with stage 1 through stage 4 chronic kidney disease, or unspecified chronic kidney disease: Secondary | ICD-10-CM | POA: Diagnosis not present

## 2017-08-12 DIAGNOSIS — I69354 Hemiplegia and hemiparesis following cerebral infarction affecting left non-dominant side: Secondary | ICD-10-CM | POA: Diagnosis not present

## 2017-08-12 DIAGNOSIS — N184 Chronic kidney disease, stage 4 (severe): Secondary | ICD-10-CM | POA: Diagnosis not present

## 2017-08-12 DIAGNOSIS — I69318 Other symptoms and signs involving cognitive functions following cerebral infarction: Secondary | ICD-10-CM | POA: Diagnosis not present

## 2017-08-15 DIAGNOSIS — D631 Anemia in chronic kidney disease: Secondary | ICD-10-CM | POA: Diagnosis not present

## 2017-08-15 DIAGNOSIS — I69318 Other symptoms and signs involving cognitive functions following cerebral infarction: Secondary | ICD-10-CM | POA: Diagnosis not present

## 2017-08-15 DIAGNOSIS — F0150 Vascular dementia without behavioral disturbance: Secondary | ICD-10-CM | POA: Diagnosis not present

## 2017-08-15 DIAGNOSIS — I69354 Hemiplegia and hemiparesis following cerebral infarction affecting left non-dominant side: Secondary | ICD-10-CM | POA: Diagnosis not present

## 2017-08-15 DIAGNOSIS — N184 Chronic kidney disease, stage 4 (severe): Secondary | ICD-10-CM | POA: Diagnosis not present

## 2017-08-15 DIAGNOSIS — I129 Hypertensive chronic kidney disease with stage 1 through stage 4 chronic kidney disease, or unspecified chronic kidney disease: Secondary | ICD-10-CM | POA: Diagnosis not present

## 2017-08-16 DIAGNOSIS — I69354 Hemiplegia and hemiparesis following cerebral infarction affecting left non-dominant side: Secondary | ICD-10-CM | POA: Diagnosis not present

## 2017-08-16 DIAGNOSIS — D631 Anemia in chronic kidney disease: Secondary | ICD-10-CM | POA: Diagnosis not present

## 2017-08-16 DIAGNOSIS — I69318 Other symptoms and signs involving cognitive functions following cerebral infarction: Secondary | ICD-10-CM | POA: Diagnosis not present

## 2017-08-16 DIAGNOSIS — I129 Hypertensive chronic kidney disease with stage 1 through stage 4 chronic kidney disease, or unspecified chronic kidney disease: Secondary | ICD-10-CM | POA: Diagnosis not present

## 2017-08-16 DIAGNOSIS — F0150 Vascular dementia without behavioral disturbance: Secondary | ICD-10-CM | POA: Diagnosis not present

## 2017-08-16 DIAGNOSIS — N184 Chronic kidney disease, stage 4 (severe): Secondary | ICD-10-CM | POA: Diagnosis not present

## 2017-08-17 DIAGNOSIS — I69354 Hemiplegia and hemiparesis following cerebral infarction affecting left non-dominant side: Secondary | ICD-10-CM | POA: Diagnosis not present

## 2017-08-17 DIAGNOSIS — I69318 Other symptoms and signs involving cognitive functions following cerebral infarction: Secondary | ICD-10-CM | POA: Diagnosis not present

## 2017-08-17 DIAGNOSIS — D631 Anemia in chronic kidney disease: Secondary | ICD-10-CM | POA: Diagnosis not present

## 2017-08-17 DIAGNOSIS — F0150 Vascular dementia without behavioral disturbance: Secondary | ICD-10-CM | POA: Diagnosis not present

## 2017-08-17 DIAGNOSIS — N184 Chronic kidney disease, stage 4 (severe): Secondary | ICD-10-CM | POA: Diagnosis not present

## 2017-08-17 DIAGNOSIS — I129 Hypertensive chronic kidney disease with stage 1 through stage 4 chronic kidney disease, or unspecified chronic kidney disease: Secondary | ICD-10-CM | POA: Diagnosis not present

## 2017-08-18 DIAGNOSIS — N184 Chronic kidney disease, stage 4 (severe): Secondary | ICD-10-CM | POA: Diagnosis not present

## 2017-08-18 DIAGNOSIS — F0150 Vascular dementia without behavioral disturbance: Secondary | ICD-10-CM | POA: Diagnosis not present

## 2017-08-18 DIAGNOSIS — I129 Hypertensive chronic kidney disease with stage 1 through stage 4 chronic kidney disease, or unspecified chronic kidney disease: Secondary | ICD-10-CM | POA: Diagnosis not present

## 2017-08-18 DIAGNOSIS — D631 Anemia in chronic kidney disease: Secondary | ICD-10-CM | POA: Diagnosis not present

## 2017-08-18 DIAGNOSIS — I69318 Other symptoms and signs involving cognitive functions following cerebral infarction: Secondary | ICD-10-CM | POA: Diagnosis not present

## 2017-08-18 DIAGNOSIS — I69354 Hemiplegia and hemiparesis following cerebral infarction affecting left non-dominant side: Secondary | ICD-10-CM | POA: Diagnosis not present

## 2017-08-19 DIAGNOSIS — I129 Hypertensive chronic kidney disease with stage 1 through stage 4 chronic kidney disease, or unspecified chronic kidney disease: Secondary | ICD-10-CM | POA: Diagnosis not present

## 2017-08-19 DIAGNOSIS — D631 Anemia in chronic kidney disease: Secondary | ICD-10-CM | POA: Diagnosis not present

## 2017-08-19 DIAGNOSIS — I69318 Other symptoms and signs involving cognitive functions following cerebral infarction: Secondary | ICD-10-CM | POA: Diagnosis not present

## 2017-08-19 DIAGNOSIS — I69354 Hemiplegia and hemiparesis following cerebral infarction affecting left non-dominant side: Secondary | ICD-10-CM | POA: Diagnosis not present

## 2017-08-19 DIAGNOSIS — F0150 Vascular dementia without behavioral disturbance: Secondary | ICD-10-CM | POA: Diagnosis not present

## 2017-08-19 DIAGNOSIS — N184 Chronic kidney disease, stage 4 (severe): Secondary | ICD-10-CM | POA: Diagnosis not present

## 2017-08-22 ENCOUNTER — Telehealth: Payer: Self-pay | Admitting: Internal Medicine

## 2017-08-22 DIAGNOSIS — M1711 Unilateral primary osteoarthritis, right knee: Secondary | ICD-10-CM | POA: Diagnosis not present

## 2017-08-22 DIAGNOSIS — D631 Anemia in chronic kidney disease: Secondary | ICD-10-CM | POA: Diagnosis not present

## 2017-08-22 DIAGNOSIS — Z9981 Dependence on supplemental oxygen: Secondary | ICD-10-CM | POA: Diagnosis not present

## 2017-08-22 DIAGNOSIS — J309 Allergic rhinitis, unspecified: Secondary | ICD-10-CM | POA: Diagnosis not present

## 2017-08-22 DIAGNOSIS — I69318 Other symptoms and signs involving cognitive functions following cerebral infarction: Secondary | ICD-10-CM | POA: Diagnosis not present

## 2017-08-22 DIAGNOSIS — I129 Hypertensive chronic kidney disease with stage 1 through stage 4 chronic kidney disease, or unspecified chronic kidney disease: Secondary | ICD-10-CM | POA: Diagnosis not present

## 2017-08-22 DIAGNOSIS — R634 Abnormal weight loss: Secondary | ICD-10-CM | POA: Diagnosis not present

## 2017-08-22 DIAGNOSIS — E785 Hyperlipidemia, unspecified: Secondary | ICD-10-CM | POA: Diagnosis not present

## 2017-08-22 DIAGNOSIS — H9110 Presbycusis, unspecified ear: Secondary | ICD-10-CM | POA: Diagnosis not present

## 2017-08-22 DIAGNOSIS — N184 Chronic kidney disease, stage 4 (severe): Secondary | ICD-10-CM | POA: Diagnosis not present

## 2017-08-22 DIAGNOSIS — H3530 Unspecified macular degeneration: Secondary | ICD-10-CM | POA: Diagnosis not present

## 2017-08-22 DIAGNOSIS — F411 Generalized anxiety disorder: Secondary | ICD-10-CM | POA: Diagnosis not present

## 2017-08-22 DIAGNOSIS — F0150 Vascular dementia without behavioral disturbance: Secondary | ICD-10-CM | POA: Diagnosis not present

## 2017-08-22 DIAGNOSIS — I69354 Hemiplegia and hemiparesis following cerebral infarction affecting left non-dominant side: Secondary | ICD-10-CM | POA: Diagnosis not present

## 2017-08-22 DIAGNOSIS — F329 Major depressive disorder, single episode, unspecified: Secondary | ICD-10-CM | POA: Diagnosis not present

## 2017-08-22 DIAGNOSIS — R011 Cardiac murmur, unspecified: Secondary | ICD-10-CM | POA: Diagnosis not present

## 2017-08-22 DIAGNOSIS — K921 Melena: Secondary | ICD-10-CM | POA: Diagnosis not present

## 2017-08-22 DIAGNOSIS — R627 Adult failure to thrive: Secondary | ICD-10-CM | POA: Diagnosis not present

## 2017-08-22 DIAGNOSIS — W19XXXD Unspecified fall, subsequent encounter: Secondary | ICD-10-CM | POA: Diagnosis not present

## 2017-08-22 NOTE — Telephone Encounter (Signed)
Is it okay to give the verbal orders listed below?

## 2017-08-22 NOTE — Telephone Encounter (Signed)
Copied from [place] [id number]. Topic: General - Other >> Aug 22, 2017 11:15 AM Cecelia Byars, NT wrote: Reason for CRM: Stacy from hospice called and said she needs verbal order to  restart lasix 10 mg 1 x a day   , and  also an order for senna S 1 tablet 2 x a day for constipation , please call  [phone]

## 2017-08-22 NOTE — Telephone Encounter (Signed)
Ok to give verbal orders as below

## 2017-08-22 NOTE — Telephone Encounter (Signed)
Please advise 

## 2017-08-23 DIAGNOSIS — F0150 Vascular dementia without behavioral disturbance: Secondary | ICD-10-CM | POA: Diagnosis not present

## 2017-08-23 DIAGNOSIS — I69354 Hemiplegia and hemiparesis following cerebral infarction affecting left non-dominant side: Secondary | ICD-10-CM | POA: Diagnosis not present

## 2017-08-23 DIAGNOSIS — N184 Chronic kidney disease, stage 4 (severe): Secondary | ICD-10-CM | POA: Diagnosis not present

## 2017-08-23 DIAGNOSIS — D631 Anemia in chronic kidney disease: Secondary | ICD-10-CM | POA: Diagnosis not present

## 2017-08-23 DIAGNOSIS — I69318 Other symptoms and signs involving cognitive functions following cerebral infarction: Secondary | ICD-10-CM | POA: Diagnosis not present

## 2017-08-23 DIAGNOSIS — I129 Hypertensive chronic kidney disease with stage 1 through stage 4 chronic kidney disease, or unspecified chronic kidney disease: Secondary | ICD-10-CM | POA: Diagnosis not present

## 2017-08-23 NOTE — Telephone Encounter (Signed)
Verbal orders were given to [facility] with Hospice.

## 2017-08-24 DIAGNOSIS — I69318 Other symptoms and signs involving cognitive functions following cerebral infarction: Secondary | ICD-10-CM | POA: Diagnosis not present

## 2017-08-24 DIAGNOSIS — D631 Anemia in chronic kidney disease: Secondary | ICD-10-CM | POA: Diagnosis not present

## 2017-08-24 DIAGNOSIS — I129 Hypertensive chronic kidney disease with stage 1 through stage 4 chronic kidney disease, or unspecified chronic kidney disease: Secondary | ICD-10-CM | POA: Diagnosis not present

## 2017-08-24 DIAGNOSIS — N184 Chronic kidney disease, stage 4 (severe): Secondary | ICD-10-CM | POA: Diagnosis not present

## 2017-08-24 DIAGNOSIS — F0150 Vascular dementia without behavioral disturbance: Secondary | ICD-10-CM | POA: Diagnosis not present

## 2017-08-24 DIAGNOSIS — I69354 Hemiplegia and hemiparesis following cerebral infarction affecting left non-dominant side: Secondary | ICD-10-CM | POA: Diagnosis not present

## 2017-08-25 DIAGNOSIS — D631 Anemia in chronic kidney disease: Secondary | ICD-10-CM | POA: Diagnosis not present

## 2017-08-25 DIAGNOSIS — F0150 Vascular dementia without behavioral disturbance: Secondary | ICD-10-CM | POA: Diagnosis not present

## 2017-08-25 DIAGNOSIS — I69354 Hemiplegia and hemiparesis following cerebral infarction affecting left non-dominant side: Secondary | ICD-10-CM | POA: Diagnosis not present

## 2017-08-25 DIAGNOSIS — I129 Hypertensive chronic kidney disease with stage 1 through stage 4 chronic kidney disease, or unspecified chronic kidney disease: Secondary | ICD-10-CM | POA: Diagnosis not present

## 2017-08-25 DIAGNOSIS — N184 Chronic kidney disease, stage 4 (severe): Secondary | ICD-10-CM | POA: Diagnosis not present

## 2017-08-25 DIAGNOSIS — I69318 Other symptoms and signs involving cognitive functions following cerebral infarction: Secondary | ICD-10-CM | POA: Diagnosis not present

## 2017-08-26 ENCOUNTER — Telehealth: Payer: Self-pay | Admitting: Internal Medicine

## 2017-08-26 ENCOUNTER — Encounter: Payer: Self-pay | Admitting: Internal Medicine

## 2017-08-26 DIAGNOSIS — F0150 Vascular dementia without behavioral disturbance: Secondary | ICD-10-CM | POA: Diagnosis not present

## 2017-08-26 DIAGNOSIS — I69318 Other symptoms and signs involving cognitive functions following cerebral infarction: Secondary | ICD-10-CM | POA: Diagnosis not present

## 2017-08-26 DIAGNOSIS — D631 Anemia in chronic kidney disease: Secondary | ICD-10-CM | POA: Diagnosis not present

## 2017-08-26 DIAGNOSIS — I69354 Hemiplegia and hemiparesis following cerebral infarction affecting left non-dominant side: Secondary | ICD-10-CM | POA: Diagnosis not present

## 2017-08-26 DIAGNOSIS — I129 Hypertensive chronic kidney disease with stage 1 through stage 4 chronic kidney disease, or unspecified chronic kidney disease: Secondary | ICD-10-CM | POA: Diagnosis not present

## 2017-08-26 DIAGNOSIS — N184 Chronic kidney disease, stage 4 (severe): Secondary | ICD-10-CM | POA: Diagnosis not present

## 2017-08-26 NOTE — Telephone Encounter (Signed)
Cindy Harvey from Hospice called office back and wanted order for chest X-ray for temp 100.6 , advised dr. Nicki Harvey of request and with previous request felt like best for patient to be evaluated at [facility] or ED . Cindy Harvey at hospice she declined along with family stating they did want to evaluate patient at [facility] or ED.

## 2017-08-26 NOTE — Telephone Encounter (Signed)
Verbal given 

## 2017-08-26 NOTE — Telephone Encounter (Signed)
Per our discussion, she was not having any cough or congestion.  Ok to do urine and start IVFs.  Declined evaluation in our office, just wanted xray.  If fever and status change, and requesting to come in for cxr - feel also needs to be evaluated.

## 2017-08-26 NOTE — Telephone Encounter (Signed)
Ok

## 2017-08-26 NOTE — Telephone Encounter (Signed)
Copied from [place] [id number]. Topic: Quick Communication - See Telephone Encounter >> Aug 26, 2017 12:10 PM Mylinda Latina, NT wrote: CRM for notification. See Telephone encounter for: 08/26/17. Stacey calling from Hospice stats the patient has not been herself. She has been sleeping more than normal. She is requesting verbal orders. The orders are clean catch urine order , and to increase fluids to 240 ml 3x a day for 3 days. Please call to give order  CB# [phone]

## 2017-08-29 DIAGNOSIS — I69354 Hemiplegia and hemiparesis following cerebral infarction affecting left non-dominant side: Secondary | ICD-10-CM | POA: Diagnosis not present

## 2017-08-29 DIAGNOSIS — N184 Chronic kidney disease, stage 4 (severe): Secondary | ICD-10-CM | POA: Diagnosis not present

## 2017-08-29 DIAGNOSIS — I69318 Other symptoms and signs involving cognitive functions following cerebral infarction: Secondary | ICD-10-CM | POA: Diagnosis not present

## 2017-08-29 DIAGNOSIS — I129 Hypertensive chronic kidney disease with stage 1 through stage 4 chronic kidney disease, or unspecified chronic kidney disease: Secondary | ICD-10-CM | POA: Diagnosis not present

## 2017-08-29 DIAGNOSIS — F0150 Vascular dementia without behavioral disturbance: Secondary | ICD-10-CM | POA: Diagnosis not present

## 2017-08-29 DIAGNOSIS — D631 Anemia in chronic kidney disease: Secondary | ICD-10-CM | POA: Diagnosis not present

## 2017-08-30 DIAGNOSIS — N184 Chronic kidney disease, stage 4 (severe): Secondary | ICD-10-CM | POA: Diagnosis not present

## 2017-08-30 DIAGNOSIS — D631 Anemia in chronic kidney disease: Secondary | ICD-10-CM | POA: Diagnosis not present

## 2017-08-30 DIAGNOSIS — F0150 Vascular dementia without behavioral disturbance: Secondary | ICD-10-CM | POA: Diagnosis not present

## 2017-08-30 DIAGNOSIS — I69354 Hemiplegia and hemiparesis following cerebral infarction affecting left non-dominant side: Secondary | ICD-10-CM | POA: Diagnosis not present

## 2017-08-30 DIAGNOSIS — I69318 Other symptoms and signs involving cognitive functions following cerebral infarction: Secondary | ICD-10-CM | POA: Diagnosis not present

## 2017-08-30 DIAGNOSIS — I129 Hypertensive chronic kidney disease with stage 1 through stage 4 chronic kidney disease, or unspecified chronic kidney disease: Secondary | ICD-10-CM | POA: Diagnosis not present

## 2017-08-31 ENCOUNTER — Telehealth: Payer: Self-pay | Admitting: Internal Medicine

## 2017-08-31 DIAGNOSIS — F0150 Vascular dementia without behavioral disturbance: Secondary | ICD-10-CM | POA: Diagnosis not present

## 2017-08-31 DIAGNOSIS — N184 Chronic kidney disease, stage 4 (severe): Secondary | ICD-10-CM | POA: Diagnosis not present

## 2017-08-31 DIAGNOSIS — I69318 Other symptoms and signs involving cognitive functions following cerebral infarction: Secondary | ICD-10-CM | POA: Diagnosis not present

## 2017-08-31 DIAGNOSIS — D631 Anemia in chronic kidney disease: Secondary | ICD-10-CM | POA: Diagnosis not present

## 2017-08-31 DIAGNOSIS — I129 Hypertensive chronic kidney disease with stage 1 through stage 4 chronic kidney disease, or unspecified chronic kidney disease: Secondary | ICD-10-CM | POA: Diagnosis not present

## 2017-08-31 DIAGNOSIS — I69354 Hemiplegia and hemiparesis following cerebral infarction affecting left non-dominant side: Secondary | ICD-10-CM | POA: Diagnosis not present

## 2017-08-31 MED ORDER — CIPROFLOXACIN HCL 250 MG PO TABS: 250.0000 mg | ORAL_TABLET | Freq: Two times a day (BID) | ORAL | 0 refills | Status: DC

## 2017-08-31 NOTE — Telephone Encounter (Signed)
Called and notified Hopice that Dr. Derrel Nip has sent in Cipro for the patient due to having a UTI to [place].

## 2017-08-31 NOTE — Telephone Encounter (Signed)
She has  UTI ,  2 bacteria,  Both sensitive to cipro.  cipro 250 mg bid x 5 days sent to [facility] . plsae call hospice  [phone] and let them know

## 2017-09-01 ENCOUNTER — Telehealth: Payer: Self-pay | Admitting: Internal Medicine

## 2017-09-01 DIAGNOSIS — N184 Chronic kidney disease, stage 4 (severe): Secondary | ICD-10-CM | POA: Diagnosis not present

## 2017-09-01 DIAGNOSIS — M25531 Pain in right wrist: Secondary | ICD-10-CM | POA: Diagnosis not present

## 2017-09-01 DIAGNOSIS — D631 Anemia in chronic kidney disease: Secondary | ICD-10-CM | POA: Diagnosis not present

## 2017-09-01 DIAGNOSIS — I69354 Hemiplegia and hemiparesis following cerebral infarction affecting left non-dominant side: Secondary | ICD-10-CM | POA: Diagnosis not present

## 2017-09-01 DIAGNOSIS — M79641 Pain in right hand: Secondary | ICD-10-CM | POA: Diagnosis not present

## 2017-09-01 DIAGNOSIS — I129 Hypertensive chronic kidney disease with stage 1 through stage 4 chronic kidney disease, or unspecified chronic kidney disease: Secondary | ICD-10-CM | POA: Diagnosis not present

## 2017-09-01 DIAGNOSIS — F0150 Vascular dementia without behavioral disturbance: Secondary | ICD-10-CM | POA: Diagnosis not present

## 2017-09-01 DIAGNOSIS — I69318 Other symptoms and signs involving cognitive functions following cerebral infarction: Secondary | ICD-10-CM | POA: Diagnosis not present

## 2017-09-01 DIAGNOSIS — W19XXXA Unspecified fall, initial encounter: Secondary | ICD-10-CM | POA: Diagnosis not present

## 2017-09-01 NOTE — Telephone Encounter (Signed)
Please advise 

## 2017-09-01 NOTE — Telephone Encounter (Signed)
Yes ok to continue bactrim and dc cipro

## 2017-09-01 NOTE — Telephone Encounter (Signed)
Spoke with Cindy Harvey from [facility] and she stated that Hospice has already put her on Bactrim for the UTI. She also stated that Hospice would like for the pt to continue taking the Bactrim and not the Cipro. Cindy Harvey stated that the pt does seem to be getting better. Is it okay to d/c the cipro?

## 2017-09-01 NOTE — Telephone Encounter (Signed)
Copied from [place] [phone]. Topic: Quick Communication - See Telephone Encounter >> Sep 01, 2017  3:49 PM Conception Chancy, NT wrote: CRM for notification. See Telephone encounter for: 09/01/17.  Cindy Harvey is calling from [place] and states that the patient was sitting in the floor and hit her right hand and wrist. She states it is sore and it is a little swollen. She would like a order for a mobile xray. Please advise.  Cb# [phone]

## 2017-09-01 NOTE — Telephone Encounter (Signed)
Order for stat mobile x ray has been sent to [facility] for Mrs.Reinertsen to have an x ray of right hand and wrist.

## 2017-09-01 NOTE — Telephone Encounter (Signed)
Patient fell today hand is swollen, bruised and red, they are needing an order for an x ray sent to [facility] at [phone] or [phone] ( after 5) and it also needs to be signed. Thank you

## 2017-09-01 NOTE — Telephone Encounter (Signed)
Ok to order xray

## 2017-09-01 NOTE — Telephone Encounter (Signed)
° °  Yevtte with [facility] call to say pt is already on Bactrim and is asking for a call back    [phone]

## 2017-09-02 ENCOUNTER — Telehealth: Payer: Self-pay | Admitting: Internal Medicine

## 2017-09-02 DIAGNOSIS — F0150 Vascular dementia without behavioral disturbance: Secondary | ICD-10-CM | POA: Diagnosis not present

## 2017-09-02 DIAGNOSIS — I69318 Other symptoms and signs involving cognitive functions following cerebral infarction: Secondary | ICD-10-CM | POA: Diagnosis not present

## 2017-09-02 DIAGNOSIS — N184 Chronic kidney disease, stage 4 (severe): Secondary | ICD-10-CM | POA: Diagnosis not present

## 2017-09-02 DIAGNOSIS — I69354 Hemiplegia and hemiparesis following cerebral infarction affecting left non-dominant side: Secondary | ICD-10-CM | POA: Diagnosis not present

## 2017-09-02 DIAGNOSIS — D631 Anemia in chronic kidney disease: Secondary | ICD-10-CM | POA: Diagnosis not present

## 2017-09-02 DIAGNOSIS — I129 Hypertensive chronic kidney disease with stage 1 through stage 4 chronic kidney disease, or unspecified chronic kidney disease: Secondary | ICD-10-CM | POA: Diagnosis not present

## 2017-09-02 NOTE — Telephone Encounter (Signed)
Patient's fall resulted in a fracture of the wrist,  Both the radial metaphysis and the ulnar styloid were fractured.  Hospice needs to take her to [facility] this afternoon to be treated ,  Or to the ER .

## 2017-09-02 NOTE — Telephone Encounter (Signed)
Cindy Harvey from [facility] they do not want her on bactrim they want her on Cipro. Ok to dc bactrim and start cipro?

## 2017-09-02 NOTE — Telephone Encounter (Signed)
X ray report given to PCP

## 2017-09-02 NOTE — Telephone Encounter (Signed)
Orders faxed

## 2017-09-02 NOTE — Telephone Encounter (Signed)
Agree with Hospice plan.  Orders  Signed

## 2017-09-02 NOTE — Telephone Encounter (Signed)
Yes they can swtich her to cipro , that was my original advice !!! The cipro rxwent to [place] on July 10

## 2017-09-02 NOTE — Telephone Encounter (Signed)
Patient is a comfort care patient and hospice is wanting and order for brace that family does not want patient ER or to [facility] , patient is not eating and drinking very little, having hallucinations hospice has faxed over for one time dose of seroquel to try and calm patient and would like an order for  Brace.

## 2017-09-02 NOTE — Telephone Encounter (Signed)
Please also inform [facility] of this message

## 2017-09-02 NOTE — Telephone Encounter (Signed)
Orders faxed to hospice

## 2017-09-03 DIAGNOSIS — I129 Hypertensive chronic kidney disease with stage 1 through stage 4 chronic kidney disease, or unspecified chronic kidney disease: Secondary | ICD-10-CM | POA: Diagnosis not present

## 2017-09-03 DIAGNOSIS — I69318 Other symptoms and signs involving cognitive functions following cerebral infarction: Secondary | ICD-10-CM | POA: Diagnosis not present

## 2017-09-03 DIAGNOSIS — N184 Chronic kidney disease, stage 4 (severe): Secondary | ICD-10-CM | POA: Diagnosis not present

## 2017-09-03 DIAGNOSIS — D631 Anemia in chronic kidney disease: Secondary | ICD-10-CM | POA: Diagnosis not present

## 2017-09-03 DIAGNOSIS — I69354 Hemiplegia and hemiparesis following cerebral infarction affecting left non-dominant side: Secondary | ICD-10-CM | POA: Diagnosis not present

## 2017-09-03 DIAGNOSIS — F0150 Vascular dementia without behavioral disturbance: Secondary | ICD-10-CM | POA: Diagnosis not present

## 2017-09-05 ENCOUNTER — Ambulatory Visit: Payer: Self-pay

## 2017-09-05 ENCOUNTER — Telehealth: Payer: Self-pay | Admitting: Internal Medicine

## 2017-09-05 DIAGNOSIS — N184 Chronic kidney disease, stage 4 (severe): Secondary | ICD-10-CM | POA: Diagnosis not present

## 2017-09-05 DIAGNOSIS — D631 Anemia in chronic kidney disease: Secondary | ICD-10-CM | POA: Diagnosis not present

## 2017-09-05 DIAGNOSIS — F0150 Vascular dementia without behavioral disturbance: Secondary | ICD-10-CM | POA: Diagnosis not present

## 2017-09-05 DIAGNOSIS — I69354 Hemiplegia and hemiparesis following cerebral infarction affecting left non-dominant side: Secondary | ICD-10-CM | POA: Diagnosis not present

## 2017-09-05 DIAGNOSIS — I129 Hypertensive chronic kidney disease with stage 1 through stage 4 chronic kidney disease, or unspecified chronic kidney disease: Secondary | ICD-10-CM | POA: Diagnosis not present

## 2017-09-05 DIAGNOSIS — I69318 Other symptoms and signs involving cognitive functions following cerebral infarction: Secondary | ICD-10-CM | POA: Diagnosis not present

## 2017-09-05 NOTE — Telephone Encounter (Signed)
OK to increase to 25 mg QHS? Pt is currently taking 12.5mg .

## 2017-09-05 NOTE — Telephone Encounter (Signed)
Left message for Stacy to call back 

## 2017-09-05 NOTE — Telephone Encounter (Unsigned)
Copied from [place] [phone]. Topic: General - Other >> Sep 05, 2017 12:16 PM Carolyn Stare wrote:  Marzetta Board with hosp cal lto say pt is not sleeping and seem very restless at night and is asking if the can  increase seroquel to 25mg    [phone]

## 2017-09-05 NOTE — Telephone Encounter (Signed)
Yrs ok to increse dose of seroquel to 25 mg

## 2017-09-06 ENCOUNTER — Other Ambulatory Visit: Payer: Self-pay | Admitting: Internal Medicine

## 2017-09-06 ENCOUNTER — Telehealth: Payer: Self-pay

## 2017-09-06 DIAGNOSIS — F0150 Vascular dementia without behavioral disturbance: Secondary | ICD-10-CM | POA: Diagnosis not present

## 2017-09-06 DIAGNOSIS — D631 Anemia in chronic kidney disease: Secondary | ICD-10-CM | POA: Diagnosis not present

## 2017-09-06 DIAGNOSIS — I69354 Hemiplegia and hemiparesis following cerebral infarction affecting left non-dominant side: Secondary | ICD-10-CM | POA: Diagnosis not present

## 2017-09-06 DIAGNOSIS — I129 Hypertensive chronic kidney disease with stage 1 through stage 4 chronic kidney disease, or unspecified chronic kidney disease: Secondary | ICD-10-CM | POA: Diagnosis not present

## 2017-09-06 DIAGNOSIS — I69318 Other symptoms and signs involving cognitive functions following cerebral infarction: Secondary | ICD-10-CM | POA: Diagnosis not present

## 2017-09-06 DIAGNOSIS — N184 Chronic kidney disease, stage 4 (severe): Secondary | ICD-10-CM | POA: Diagnosis not present

## 2017-09-06 MED ORDER — LORAZEPAM 0.5 MG PO TABS: 0.5000 mg | ORAL_TABLET | ORAL | 5 refills | Status: DC | PRN

## 2017-09-06 NOTE — Telephone Encounter (Signed)
If the request did not come from Hospice/palliative care,  It will need to come from them.

## 2017-09-06 NOTE — Telephone Encounter (Signed)
Spoke with Hospice Triage Nurse and she stated that the this medication is listed on the initial paperwork that was signed giving the nurse permission to request medication if pt became anxious, agitated, or restless. Have printed off the list with the signature for you to look at.

## 2017-09-06 NOTE — Telephone Encounter (Signed)
Lorazepam 0.5 mg q 4 hrs prn rx printed and signed for faxinf

## 2017-09-06 NOTE — Telephone Encounter (Signed)
Received a refill request from [facility] for Lorazepam 0.5mg  tablet take 1 tablet by mouth every four hours as needed for anxiety/ agitation/ restlessness(control). Medication is not in current med list.   Last OV: 05/18/2017 Next OV: not scheduled

## 2017-09-06 NOTE — Telephone Encounter (Signed)
Printed, signed and faxed.  

## 2017-09-07 DIAGNOSIS — D631 Anemia in chronic kidney disease: Secondary | ICD-10-CM | POA: Diagnosis not present

## 2017-09-07 DIAGNOSIS — I129 Hypertensive chronic kidney disease with stage 1 through stage 4 chronic kidney disease, or unspecified chronic kidney disease: Secondary | ICD-10-CM | POA: Diagnosis not present

## 2017-09-07 DIAGNOSIS — N184 Chronic kidney disease, stage 4 (severe): Secondary | ICD-10-CM | POA: Diagnosis not present

## 2017-09-07 DIAGNOSIS — I69318 Other symptoms and signs involving cognitive functions following cerebral infarction: Secondary | ICD-10-CM | POA: Diagnosis not present

## 2017-09-07 DIAGNOSIS — F0150 Vascular dementia without behavioral disturbance: Secondary | ICD-10-CM | POA: Diagnosis not present

## 2017-09-07 DIAGNOSIS — I69354 Hemiplegia and hemiparesis following cerebral infarction affecting left non-dominant side: Secondary | ICD-10-CM | POA: Diagnosis not present

## 2017-09-08 DIAGNOSIS — I129 Hypertensive chronic kidney disease with stage 1 through stage 4 chronic kidney disease, or unspecified chronic kidney disease: Secondary | ICD-10-CM | POA: Diagnosis not present

## 2017-09-08 DIAGNOSIS — I69354 Hemiplegia and hemiparesis following cerebral infarction affecting left non-dominant side: Secondary | ICD-10-CM | POA: Diagnosis not present

## 2017-09-08 DIAGNOSIS — D631 Anemia in chronic kidney disease: Secondary | ICD-10-CM | POA: Diagnosis not present

## 2017-09-08 DIAGNOSIS — N184 Chronic kidney disease, stage 4 (severe): Secondary | ICD-10-CM | POA: Diagnosis not present

## 2017-09-08 DIAGNOSIS — F0150 Vascular dementia without behavioral disturbance: Secondary | ICD-10-CM | POA: Diagnosis not present

## 2017-09-08 DIAGNOSIS — I69318 Other symptoms and signs involving cognitive functions following cerebral infarction: Secondary | ICD-10-CM | POA: Diagnosis not present

## 2017-09-09 DIAGNOSIS — F0150 Vascular dementia without behavioral disturbance: Secondary | ICD-10-CM | POA: Diagnosis not present

## 2017-09-09 DIAGNOSIS — N184 Chronic kidney disease, stage 4 (severe): Secondary | ICD-10-CM | POA: Diagnosis not present

## 2017-09-09 DIAGNOSIS — I129 Hypertensive chronic kidney disease with stage 1 through stage 4 chronic kidney disease, or unspecified chronic kidney disease: Secondary | ICD-10-CM | POA: Diagnosis not present

## 2017-09-09 DIAGNOSIS — I69318 Other symptoms and signs involving cognitive functions following cerebral infarction: Secondary | ICD-10-CM | POA: Diagnosis not present

## 2017-09-09 DIAGNOSIS — I69354 Hemiplegia and hemiparesis following cerebral infarction affecting left non-dominant side: Secondary | ICD-10-CM | POA: Diagnosis not present

## 2017-09-09 DIAGNOSIS — D631 Anemia in chronic kidney disease: Secondary | ICD-10-CM | POA: Diagnosis not present

## 2017-09-09 NOTE — Telephone Encounter (Signed)
Cindy Harvey stated that they did try increasing seroquel for a couple days but patient did not tolerate, it was too strong. Patient has been decreased back to 12.5 and tolerating well. Better today.

## 2017-09-12 DIAGNOSIS — I69318 Other symptoms and signs involving cognitive functions following cerebral infarction: Secondary | ICD-10-CM | POA: Diagnosis not present

## 2017-09-12 DIAGNOSIS — I129 Hypertensive chronic kidney disease with stage 1 through stage 4 chronic kidney disease, or unspecified chronic kidney disease: Secondary | ICD-10-CM | POA: Diagnosis not present

## 2017-09-12 DIAGNOSIS — N184 Chronic kidney disease, stage 4 (severe): Secondary | ICD-10-CM | POA: Diagnosis not present

## 2017-09-12 DIAGNOSIS — D631 Anemia in chronic kidney disease: Secondary | ICD-10-CM | POA: Diagnosis not present

## 2017-09-12 DIAGNOSIS — I69354 Hemiplegia and hemiparesis following cerebral infarction affecting left non-dominant side: Secondary | ICD-10-CM | POA: Diagnosis not present

## 2017-09-12 DIAGNOSIS — F0150 Vascular dementia without behavioral disturbance: Secondary | ICD-10-CM | POA: Diagnosis not present

## 2017-09-13 DIAGNOSIS — I129 Hypertensive chronic kidney disease with stage 1 through stage 4 chronic kidney disease, or unspecified chronic kidney disease: Secondary | ICD-10-CM | POA: Diagnosis not present

## 2017-09-13 DIAGNOSIS — N184 Chronic kidney disease, stage 4 (severe): Secondary | ICD-10-CM | POA: Diagnosis not present

## 2017-09-13 DIAGNOSIS — D631 Anemia in chronic kidney disease: Secondary | ICD-10-CM | POA: Diagnosis not present

## 2017-09-13 DIAGNOSIS — F0150 Vascular dementia without behavioral disturbance: Secondary | ICD-10-CM | POA: Diagnosis not present

## 2017-09-13 DIAGNOSIS — I69318 Other symptoms and signs involving cognitive functions following cerebral infarction: Secondary | ICD-10-CM | POA: Diagnosis not present

## 2017-09-13 DIAGNOSIS — I69354 Hemiplegia and hemiparesis following cerebral infarction affecting left non-dominant side: Secondary | ICD-10-CM | POA: Diagnosis not present

## 2017-09-14 ENCOUNTER — Telehealth: Payer: Self-pay | Admitting: Internal Medicine

## 2017-09-14 DIAGNOSIS — I129 Hypertensive chronic kidney disease with stage 1 through stage 4 chronic kidney disease, or unspecified chronic kidney disease: Secondary | ICD-10-CM | POA: Diagnosis not present

## 2017-09-14 DIAGNOSIS — D631 Anemia in chronic kidney disease: Secondary | ICD-10-CM | POA: Diagnosis not present

## 2017-09-14 DIAGNOSIS — N184 Chronic kidney disease, stage 4 (severe): Secondary | ICD-10-CM | POA: Diagnosis not present

## 2017-09-14 DIAGNOSIS — I69318 Other symptoms and signs involving cognitive functions following cerebral infarction: Secondary | ICD-10-CM | POA: Diagnosis not present

## 2017-09-14 DIAGNOSIS — F0150 Vascular dementia without behavioral disturbance: Secondary | ICD-10-CM | POA: Diagnosis not present

## 2017-09-14 DIAGNOSIS — I69354 Hemiplegia and hemiparesis following cerebral infarction affecting left non-dominant side: Secondary | ICD-10-CM | POA: Diagnosis not present

## 2017-09-14 NOTE — Telephone Encounter (Signed)
Verbals given  

## 2017-09-14 NOTE — Telephone Encounter (Signed)
Yes please give verbal authorization to suspend Senna for 1 week and resume 1 tablet  every 3 days after the one week suspension

## 2017-09-14 NOTE — Telephone Encounter (Signed)
rx request 

## 2017-09-14 NOTE — Telephone Encounter (Signed)
Stacy, RN from Hospice called and stated that the pt has been having loose stools and would like a verbal okay to hold the senna for 1 week and then restart one tablet every 3 days. Is it okay to give the verbal order?

## 2017-09-14 NOTE — Telephone Encounter (Signed)
Copied from [place] [phone]. Topic: General - Other >> Sep 14, 2017  8:50 AM Lennox Solders wrote: Reason for NKN:LZJQB rn [place] hospice is calling the patient is having loose stools and she would like to hold senna s  for 1 wk and then restart one tablet every 3 day .

## 2017-09-15 DIAGNOSIS — I69318 Other symptoms and signs involving cognitive functions following cerebral infarction: Secondary | ICD-10-CM | POA: Diagnosis not present

## 2017-09-15 DIAGNOSIS — I129 Hypertensive chronic kidney disease with stage 1 through stage 4 chronic kidney disease, or unspecified chronic kidney disease: Secondary | ICD-10-CM | POA: Diagnosis not present

## 2017-09-15 DIAGNOSIS — F0150 Vascular dementia without behavioral disturbance: Secondary | ICD-10-CM | POA: Diagnosis not present

## 2017-09-15 DIAGNOSIS — N184 Chronic kidney disease, stage 4 (severe): Secondary | ICD-10-CM | POA: Diagnosis not present

## 2017-09-15 DIAGNOSIS — I69354 Hemiplegia and hemiparesis following cerebral infarction affecting left non-dominant side: Secondary | ICD-10-CM | POA: Diagnosis not present

## 2017-09-15 DIAGNOSIS — D631 Anemia in chronic kidney disease: Secondary | ICD-10-CM | POA: Diagnosis not present

## 2017-09-16 DIAGNOSIS — N184 Chronic kidney disease, stage 4 (severe): Secondary | ICD-10-CM | POA: Diagnosis not present

## 2017-09-16 DIAGNOSIS — I69318 Other symptoms and signs involving cognitive functions following cerebral infarction: Secondary | ICD-10-CM | POA: Diagnosis not present

## 2017-09-16 DIAGNOSIS — F0150 Vascular dementia without behavioral disturbance: Secondary | ICD-10-CM | POA: Diagnosis not present

## 2017-09-16 DIAGNOSIS — I69354 Hemiplegia and hemiparesis following cerebral infarction affecting left non-dominant side: Secondary | ICD-10-CM | POA: Diagnosis not present

## 2017-09-16 DIAGNOSIS — D631 Anemia in chronic kidney disease: Secondary | ICD-10-CM | POA: Diagnosis not present

## 2017-09-16 DIAGNOSIS — I129 Hypertensive chronic kidney disease with stage 1 through stage 4 chronic kidney disease, or unspecified chronic kidney disease: Secondary | ICD-10-CM | POA: Diagnosis not present

## 2017-09-19 DIAGNOSIS — I129 Hypertensive chronic kidney disease with stage 1 through stage 4 chronic kidney disease, or unspecified chronic kidney disease: Secondary | ICD-10-CM | POA: Diagnosis not present

## 2017-09-19 DIAGNOSIS — I69354 Hemiplegia and hemiparesis following cerebral infarction affecting left non-dominant side: Secondary | ICD-10-CM | POA: Diagnosis not present

## 2017-09-19 DIAGNOSIS — D631 Anemia in chronic kidney disease: Secondary | ICD-10-CM | POA: Diagnosis not present

## 2017-09-19 DIAGNOSIS — F0150 Vascular dementia without behavioral disturbance: Secondary | ICD-10-CM | POA: Diagnosis not present

## 2017-09-19 DIAGNOSIS — I69318 Other symptoms and signs involving cognitive functions following cerebral infarction: Secondary | ICD-10-CM | POA: Diagnosis not present

## 2017-09-19 DIAGNOSIS — N184 Chronic kidney disease, stage 4 (severe): Secondary | ICD-10-CM | POA: Diagnosis not present

## 2017-09-20 DIAGNOSIS — D631 Anemia in chronic kidney disease: Secondary | ICD-10-CM | POA: Diagnosis not present

## 2017-09-20 DIAGNOSIS — F0150 Vascular dementia without behavioral disturbance: Secondary | ICD-10-CM | POA: Diagnosis not present

## 2017-09-20 DIAGNOSIS — I129 Hypertensive chronic kidney disease with stage 1 through stage 4 chronic kidney disease, or unspecified chronic kidney disease: Secondary | ICD-10-CM | POA: Diagnosis not present

## 2017-09-20 DIAGNOSIS — I69318 Other symptoms and signs involving cognitive functions following cerebral infarction: Secondary | ICD-10-CM | POA: Diagnosis not present

## 2017-09-20 DIAGNOSIS — N184 Chronic kidney disease, stage 4 (severe): Secondary | ICD-10-CM | POA: Diagnosis not present

## 2017-09-20 DIAGNOSIS — I69354 Hemiplegia and hemiparesis following cerebral infarction affecting left non-dominant side: Secondary | ICD-10-CM | POA: Diagnosis not present

## 2017-09-21 DIAGNOSIS — I69318 Other symptoms and signs involving cognitive functions following cerebral infarction: Secondary | ICD-10-CM | POA: Diagnosis not present

## 2017-09-21 DIAGNOSIS — I69354 Hemiplegia and hemiparesis following cerebral infarction affecting left non-dominant side: Secondary | ICD-10-CM | POA: Diagnosis not present

## 2017-09-21 DIAGNOSIS — N184 Chronic kidney disease, stage 4 (severe): Secondary | ICD-10-CM | POA: Diagnosis not present

## 2017-09-21 DIAGNOSIS — F0150 Vascular dementia without behavioral disturbance: Secondary | ICD-10-CM | POA: Diagnosis not present

## 2017-09-21 DIAGNOSIS — D631 Anemia in chronic kidney disease: Secondary | ICD-10-CM | POA: Diagnosis not present

## 2017-09-21 DIAGNOSIS — I129 Hypertensive chronic kidney disease with stage 1 through stage 4 chronic kidney disease, or unspecified chronic kidney disease: Secondary | ICD-10-CM | POA: Diagnosis not present

## 2017-09-22 DIAGNOSIS — J309 Allergic rhinitis, unspecified: Secondary | ICD-10-CM | POA: Diagnosis not present

## 2017-09-22 DIAGNOSIS — D631 Anemia in chronic kidney disease: Secondary | ICD-10-CM | POA: Diagnosis not present

## 2017-09-22 DIAGNOSIS — R627 Adult failure to thrive: Secondary | ICD-10-CM | POA: Diagnosis not present

## 2017-09-22 DIAGNOSIS — F0150 Vascular dementia without behavioral disturbance: Secondary | ICD-10-CM | POA: Diagnosis not present

## 2017-09-22 DIAGNOSIS — R011 Cardiac murmur, unspecified: Secondary | ICD-10-CM | POA: Diagnosis not present

## 2017-09-22 DIAGNOSIS — N184 Chronic kidney disease, stage 4 (severe): Secondary | ICD-10-CM | POA: Diagnosis not present

## 2017-09-22 DIAGNOSIS — E785 Hyperlipidemia, unspecified: Secondary | ICD-10-CM | POA: Diagnosis not present

## 2017-09-22 DIAGNOSIS — H3530 Unspecified macular degeneration: Secondary | ICD-10-CM | POA: Diagnosis not present

## 2017-09-22 DIAGNOSIS — I69354 Hemiplegia and hemiparesis following cerebral infarction affecting left non-dominant side: Secondary | ICD-10-CM | POA: Diagnosis not present

## 2017-09-22 DIAGNOSIS — Z9981 Dependence on supplemental oxygen: Secondary | ICD-10-CM | POA: Diagnosis not present

## 2017-09-22 DIAGNOSIS — F411 Generalized anxiety disorder: Secondary | ICD-10-CM | POA: Diagnosis not present

## 2017-09-22 DIAGNOSIS — R634 Abnormal weight loss: Secondary | ICD-10-CM | POA: Diagnosis not present

## 2017-09-22 DIAGNOSIS — W19XXXD Unspecified fall, subsequent encounter: Secondary | ICD-10-CM | POA: Diagnosis not present

## 2017-09-22 DIAGNOSIS — I129 Hypertensive chronic kidney disease with stage 1 through stage 4 chronic kidney disease, or unspecified chronic kidney disease: Secondary | ICD-10-CM | POA: Diagnosis not present

## 2017-09-22 DIAGNOSIS — H9110 Presbycusis, unspecified ear: Secondary | ICD-10-CM | POA: Diagnosis not present

## 2017-09-22 DIAGNOSIS — I69318 Other symptoms and signs involving cognitive functions following cerebral infarction: Secondary | ICD-10-CM | POA: Diagnosis not present

## 2017-09-22 DIAGNOSIS — F329 Major depressive disorder, single episode, unspecified: Secondary | ICD-10-CM | POA: Diagnosis not present

## 2017-09-22 DIAGNOSIS — M1711 Unilateral primary osteoarthritis, right knee: Secondary | ICD-10-CM | POA: Diagnosis not present

## 2017-09-22 DIAGNOSIS — K921 Melena: Secondary | ICD-10-CM | POA: Diagnosis not present

## 2017-09-23 DIAGNOSIS — D631 Anemia in chronic kidney disease: Secondary | ICD-10-CM | POA: Diagnosis not present

## 2017-09-23 DIAGNOSIS — F0150 Vascular dementia without behavioral disturbance: Secondary | ICD-10-CM | POA: Diagnosis not present

## 2017-09-23 DIAGNOSIS — N184 Chronic kidney disease, stage 4 (severe): Secondary | ICD-10-CM | POA: Diagnosis not present

## 2017-09-23 DIAGNOSIS — I69354 Hemiplegia and hemiparesis following cerebral infarction affecting left non-dominant side: Secondary | ICD-10-CM | POA: Diagnosis not present

## 2017-09-23 DIAGNOSIS — I69318 Other symptoms and signs involving cognitive functions following cerebral infarction: Secondary | ICD-10-CM | POA: Diagnosis not present

## 2017-09-23 DIAGNOSIS — I129 Hypertensive chronic kidney disease with stage 1 through stage 4 chronic kidney disease, or unspecified chronic kidney disease: Secondary | ICD-10-CM | POA: Diagnosis not present

## 2017-09-26 DIAGNOSIS — F0150 Vascular dementia without behavioral disturbance: Secondary | ICD-10-CM | POA: Diagnosis not present

## 2017-09-26 DIAGNOSIS — I69354 Hemiplegia and hemiparesis following cerebral infarction affecting left non-dominant side: Secondary | ICD-10-CM | POA: Diagnosis not present

## 2017-09-26 DIAGNOSIS — I69318 Other symptoms and signs involving cognitive functions following cerebral infarction: Secondary | ICD-10-CM | POA: Diagnosis not present

## 2017-09-26 DIAGNOSIS — N184 Chronic kidney disease, stage 4 (severe): Secondary | ICD-10-CM | POA: Diagnosis not present

## 2017-09-26 DIAGNOSIS — D631 Anemia in chronic kidney disease: Secondary | ICD-10-CM | POA: Diagnosis not present

## 2017-09-26 DIAGNOSIS — I129 Hypertensive chronic kidney disease with stage 1 through stage 4 chronic kidney disease, or unspecified chronic kidney disease: Secondary | ICD-10-CM | POA: Diagnosis not present

## 2017-09-27 DIAGNOSIS — I69354 Hemiplegia and hemiparesis following cerebral infarction affecting left non-dominant side: Secondary | ICD-10-CM | POA: Diagnosis not present

## 2017-09-27 DIAGNOSIS — F0150 Vascular dementia without behavioral disturbance: Secondary | ICD-10-CM | POA: Diagnosis not present

## 2017-09-27 DIAGNOSIS — I69318 Other symptoms and signs involving cognitive functions following cerebral infarction: Secondary | ICD-10-CM | POA: Diagnosis not present

## 2017-09-27 DIAGNOSIS — D631 Anemia in chronic kidney disease: Secondary | ICD-10-CM | POA: Diagnosis not present

## 2017-09-27 DIAGNOSIS — N184 Chronic kidney disease, stage 4 (severe): Secondary | ICD-10-CM | POA: Diagnosis not present

## 2017-09-27 DIAGNOSIS — I129 Hypertensive chronic kidney disease with stage 1 through stage 4 chronic kidney disease, or unspecified chronic kidney disease: Secondary | ICD-10-CM | POA: Diagnosis not present

## 2017-09-28 DIAGNOSIS — N184 Chronic kidney disease, stage 4 (severe): Secondary | ICD-10-CM | POA: Diagnosis not present

## 2017-09-28 DIAGNOSIS — I69318 Other symptoms and signs involving cognitive functions following cerebral infarction: Secondary | ICD-10-CM | POA: Diagnosis not present

## 2017-09-28 DIAGNOSIS — F0150 Vascular dementia without behavioral disturbance: Secondary | ICD-10-CM | POA: Diagnosis not present

## 2017-09-28 DIAGNOSIS — I129 Hypertensive chronic kidney disease with stage 1 through stage 4 chronic kidney disease, or unspecified chronic kidney disease: Secondary | ICD-10-CM | POA: Diagnosis not present

## 2017-09-28 DIAGNOSIS — I69354 Hemiplegia and hemiparesis following cerebral infarction affecting left non-dominant side: Secondary | ICD-10-CM | POA: Diagnosis not present

## 2017-09-28 DIAGNOSIS — D631 Anemia in chronic kidney disease: Secondary | ICD-10-CM | POA: Diagnosis not present

## 2017-09-29 DIAGNOSIS — N184 Chronic kidney disease, stage 4 (severe): Secondary | ICD-10-CM | POA: Diagnosis not present

## 2017-09-29 DIAGNOSIS — I69318 Other symptoms and signs involving cognitive functions following cerebral infarction: Secondary | ICD-10-CM | POA: Diagnosis not present

## 2017-09-29 DIAGNOSIS — D631 Anemia in chronic kidney disease: Secondary | ICD-10-CM | POA: Diagnosis not present

## 2017-09-29 DIAGNOSIS — F0150 Vascular dementia without behavioral disturbance: Secondary | ICD-10-CM | POA: Diagnosis not present

## 2017-09-29 DIAGNOSIS — I129 Hypertensive chronic kidney disease with stage 1 through stage 4 chronic kidney disease, or unspecified chronic kidney disease: Secondary | ICD-10-CM | POA: Diagnosis not present

## 2017-09-29 DIAGNOSIS — I69354 Hemiplegia and hemiparesis following cerebral infarction affecting left non-dominant side: Secondary | ICD-10-CM | POA: Diagnosis not present

## 2017-09-30 DIAGNOSIS — I69318 Other symptoms and signs involving cognitive functions following cerebral infarction: Secondary | ICD-10-CM | POA: Diagnosis not present

## 2017-09-30 DIAGNOSIS — I69354 Hemiplegia and hemiparesis following cerebral infarction affecting left non-dominant side: Secondary | ICD-10-CM | POA: Diagnosis not present

## 2017-09-30 DIAGNOSIS — F0150 Vascular dementia without behavioral disturbance: Secondary | ICD-10-CM | POA: Diagnosis not present

## 2017-09-30 DIAGNOSIS — D631 Anemia in chronic kidney disease: Secondary | ICD-10-CM | POA: Diagnosis not present

## 2017-09-30 DIAGNOSIS — N184 Chronic kidney disease, stage 4 (severe): Secondary | ICD-10-CM | POA: Diagnosis not present

## 2017-09-30 DIAGNOSIS — I129 Hypertensive chronic kidney disease with stage 1 through stage 4 chronic kidney disease, or unspecified chronic kidney disease: Secondary | ICD-10-CM | POA: Diagnosis not present

## 2017-10-03 DIAGNOSIS — D631 Anemia in chronic kidney disease: Secondary | ICD-10-CM | POA: Diagnosis not present

## 2017-10-03 DIAGNOSIS — I129 Hypertensive chronic kidney disease with stage 1 through stage 4 chronic kidney disease, or unspecified chronic kidney disease: Secondary | ICD-10-CM | POA: Diagnosis not present

## 2017-10-03 DIAGNOSIS — N184 Chronic kidney disease, stage 4 (severe): Secondary | ICD-10-CM | POA: Diagnosis not present

## 2017-10-03 DIAGNOSIS — F0150 Vascular dementia without behavioral disturbance: Secondary | ICD-10-CM | POA: Diagnosis not present

## 2017-10-03 DIAGNOSIS — I69318 Other symptoms and signs involving cognitive functions following cerebral infarction: Secondary | ICD-10-CM | POA: Diagnosis not present

## 2017-10-03 DIAGNOSIS — I69354 Hemiplegia and hemiparesis following cerebral infarction affecting left non-dominant side: Secondary | ICD-10-CM | POA: Diagnosis not present

## 2017-10-04 DIAGNOSIS — I129 Hypertensive chronic kidney disease with stage 1 through stage 4 chronic kidney disease, or unspecified chronic kidney disease: Secondary | ICD-10-CM | POA: Diagnosis not present

## 2017-10-04 DIAGNOSIS — I69354 Hemiplegia and hemiparesis following cerebral infarction affecting left non-dominant side: Secondary | ICD-10-CM | POA: Diagnosis not present

## 2017-10-04 DIAGNOSIS — F0150 Vascular dementia without behavioral disturbance: Secondary | ICD-10-CM | POA: Diagnosis not present

## 2017-10-04 DIAGNOSIS — I69318 Other symptoms and signs involving cognitive functions following cerebral infarction: Secondary | ICD-10-CM | POA: Diagnosis not present

## 2017-10-04 DIAGNOSIS — N184 Chronic kidney disease, stage 4 (severe): Secondary | ICD-10-CM | POA: Diagnosis not present

## 2017-10-04 DIAGNOSIS — D631 Anemia in chronic kidney disease: Secondary | ICD-10-CM | POA: Diagnosis not present

## 2017-10-05 DIAGNOSIS — I129 Hypertensive chronic kidney disease with stage 1 through stage 4 chronic kidney disease, or unspecified chronic kidney disease: Secondary | ICD-10-CM | POA: Diagnosis not present

## 2017-10-05 DIAGNOSIS — I69318 Other symptoms and signs involving cognitive functions following cerebral infarction: Secondary | ICD-10-CM | POA: Diagnosis not present

## 2017-10-05 DIAGNOSIS — D631 Anemia in chronic kidney disease: Secondary | ICD-10-CM | POA: Diagnosis not present

## 2017-10-05 DIAGNOSIS — I69354 Hemiplegia and hemiparesis following cerebral infarction affecting left non-dominant side: Secondary | ICD-10-CM | POA: Diagnosis not present

## 2017-10-05 DIAGNOSIS — N184 Chronic kidney disease, stage 4 (severe): Secondary | ICD-10-CM | POA: Diagnosis not present

## 2017-10-05 DIAGNOSIS — F0150 Vascular dementia without behavioral disturbance: Secondary | ICD-10-CM | POA: Diagnosis not present

## 2017-10-06 DIAGNOSIS — D631 Anemia in chronic kidney disease: Secondary | ICD-10-CM | POA: Diagnosis not present

## 2017-10-06 DIAGNOSIS — I129 Hypertensive chronic kidney disease with stage 1 through stage 4 chronic kidney disease, or unspecified chronic kidney disease: Secondary | ICD-10-CM | POA: Diagnosis not present

## 2017-10-06 DIAGNOSIS — I69354 Hemiplegia and hemiparesis following cerebral infarction affecting left non-dominant side: Secondary | ICD-10-CM | POA: Diagnosis not present

## 2017-10-06 DIAGNOSIS — F0150 Vascular dementia without behavioral disturbance: Secondary | ICD-10-CM | POA: Diagnosis not present

## 2017-10-06 DIAGNOSIS — N184 Chronic kidney disease, stage 4 (severe): Secondary | ICD-10-CM | POA: Diagnosis not present

## 2017-10-06 DIAGNOSIS — I69318 Other symptoms and signs involving cognitive functions following cerebral infarction: Secondary | ICD-10-CM | POA: Diagnosis not present

## 2017-10-07 DIAGNOSIS — D631 Anemia in chronic kidney disease: Secondary | ICD-10-CM | POA: Diagnosis not present

## 2017-10-07 DIAGNOSIS — I69354 Hemiplegia and hemiparesis following cerebral infarction affecting left non-dominant side: Secondary | ICD-10-CM | POA: Diagnosis not present

## 2017-10-07 DIAGNOSIS — I129 Hypertensive chronic kidney disease with stage 1 through stage 4 chronic kidney disease, or unspecified chronic kidney disease: Secondary | ICD-10-CM | POA: Diagnosis not present

## 2017-10-07 DIAGNOSIS — N184 Chronic kidney disease, stage 4 (severe): Secondary | ICD-10-CM | POA: Diagnosis not present

## 2017-10-07 DIAGNOSIS — F0150 Vascular dementia without behavioral disturbance: Secondary | ICD-10-CM | POA: Diagnosis not present

## 2017-10-07 DIAGNOSIS — I69318 Other symptoms and signs involving cognitive functions following cerebral infarction: Secondary | ICD-10-CM | POA: Diagnosis not present

## 2017-10-10 DIAGNOSIS — I69318 Other symptoms and signs involving cognitive functions following cerebral infarction: Secondary | ICD-10-CM | POA: Diagnosis not present

## 2017-10-10 DIAGNOSIS — I69354 Hemiplegia and hemiparesis following cerebral infarction affecting left non-dominant side: Secondary | ICD-10-CM | POA: Diagnosis not present

## 2017-10-10 DIAGNOSIS — D631 Anemia in chronic kidney disease: Secondary | ICD-10-CM | POA: Diagnosis not present

## 2017-10-10 DIAGNOSIS — I129 Hypertensive chronic kidney disease with stage 1 through stage 4 chronic kidney disease, or unspecified chronic kidney disease: Secondary | ICD-10-CM | POA: Diagnosis not present

## 2017-10-10 DIAGNOSIS — N184 Chronic kidney disease, stage 4 (severe): Secondary | ICD-10-CM | POA: Diagnosis not present

## 2017-10-10 DIAGNOSIS — F0150 Vascular dementia without behavioral disturbance: Secondary | ICD-10-CM | POA: Diagnosis not present

## 2017-10-11 DIAGNOSIS — F0150 Vascular dementia without behavioral disturbance: Secondary | ICD-10-CM | POA: Diagnosis not present

## 2017-10-11 DIAGNOSIS — I69354 Hemiplegia and hemiparesis following cerebral infarction affecting left non-dominant side: Secondary | ICD-10-CM | POA: Diagnosis not present

## 2017-10-11 DIAGNOSIS — I69318 Other symptoms and signs involving cognitive functions following cerebral infarction: Secondary | ICD-10-CM | POA: Diagnosis not present

## 2017-10-11 DIAGNOSIS — D631 Anemia in chronic kidney disease: Secondary | ICD-10-CM | POA: Diagnosis not present

## 2017-10-11 DIAGNOSIS — N184 Chronic kidney disease, stage 4 (severe): Secondary | ICD-10-CM | POA: Diagnosis not present

## 2017-10-11 DIAGNOSIS — I129 Hypertensive chronic kidney disease with stage 1 through stage 4 chronic kidney disease, or unspecified chronic kidney disease: Secondary | ICD-10-CM | POA: Diagnosis not present

## 2017-10-12 DIAGNOSIS — N184 Chronic kidney disease, stage 4 (severe): Secondary | ICD-10-CM | POA: Diagnosis not present

## 2017-10-12 DIAGNOSIS — I69318 Other symptoms and signs involving cognitive functions following cerebral infarction: Secondary | ICD-10-CM | POA: Diagnosis not present

## 2017-10-12 DIAGNOSIS — D631 Anemia in chronic kidney disease: Secondary | ICD-10-CM | POA: Diagnosis not present

## 2017-10-12 DIAGNOSIS — F0150 Vascular dementia without behavioral disturbance: Secondary | ICD-10-CM | POA: Diagnosis not present

## 2017-10-12 DIAGNOSIS — I129 Hypertensive chronic kidney disease with stage 1 through stage 4 chronic kidney disease, or unspecified chronic kidney disease: Secondary | ICD-10-CM | POA: Diagnosis not present

## 2017-10-12 DIAGNOSIS — I69354 Hemiplegia and hemiparesis following cerebral infarction affecting left non-dominant side: Secondary | ICD-10-CM | POA: Diagnosis not present

## 2017-10-13 DIAGNOSIS — N184 Chronic kidney disease, stage 4 (severe): Secondary | ICD-10-CM | POA: Diagnosis not present

## 2017-10-13 DIAGNOSIS — F0150 Vascular dementia without behavioral disturbance: Secondary | ICD-10-CM | POA: Diagnosis not present

## 2017-10-13 DIAGNOSIS — I69318 Other symptoms and signs involving cognitive functions following cerebral infarction: Secondary | ICD-10-CM | POA: Diagnosis not present

## 2017-10-13 DIAGNOSIS — I129 Hypertensive chronic kidney disease with stage 1 through stage 4 chronic kidney disease, or unspecified chronic kidney disease: Secondary | ICD-10-CM | POA: Diagnosis not present

## 2017-10-13 DIAGNOSIS — I69354 Hemiplegia and hemiparesis following cerebral infarction affecting left non-dominant side: Secondary | ICD-10-CM | POA: Diagnosis not present

## 2017-10-13 DIAGNOSIS — D631 Anemia in chronic kidney disease: Secondary | ICD-10-CM | POA: Diagnosis not present

## 2017-10-14 DIAGNOSIS — I129 Hypertensive chronic kidney disease with stage 1 through stage 4 chronic kidney disease, or unspecified chronic kidney disease: Secondary | ICD-10-CM | POA: Diagnosis not present

## 2017-10-14 DIAGNOSIS — I69354 Hemiplegia and hemiparesis following cerebral infarction affecting left non-dominant side: Secondary | ICD-10-CM | POA: Diagnosis not present

## 2017-10-14 DIAGNOSIS — D631 Anemia in chronic kidney disease: Secondary | ICD-10-CM | POA: Diagnosis not present

## 2017-10-14 DIAGNOSIS — I69318 Other symptoms and signs involving cognitive functions following cerebral infarction: Secondary | ICD-10-CM | POA: Diagnosis not present

## 2017-10-14 DIAGNOSIS — N184 Chronic kidney disease, stage 4 (severe): Secondary | ICD-10-CM | POA: Diagnosis not present

## 2017-10-14 DIAGNOSIS — F0150 Vascular dementia without behavioral disturbance: Secondary | ICD-10-CM | POA: Diagnosis not present

## 2017-10-17 DIAGNOSIS — I129 Hypertensive chronic kidney disease with stage 1 through stage 4 chronic kidney disease, or unspecified chronic kidney disease: Secondary | ICD-10-CM | POA: Diagnosis not present

## 2017-10-17 DIAGNOSIS — I69354 Hemiplegia and hemiparesis following cerebral infarction affecting left non-dominant side: Secondary | ICD-10-CM | POA: Diagnosis not present

## 2017-10-17 DIAGNOSIS — D631 Anemia in chronic kidney disease: Secondary | ICD-10-CM | POA: Diagnosis not present

## 2017-10-17 DIAGNOSIS — F0150 Vascular dementia without behavioral disturbance: Secondary | ICD-10-CM | POA: Diagnosis not present

## 2017-10-17 DIAGNOSIS — N184 Chronic kidney disease, stage 4 (severe): Secondary | ICD-10-CM | POA: Diagnosis not present

## 2017-10-17 DIAGNOSIS — I69318 Other symptoms and signs involving cognitive functions following cerebral infarction: Secondary | ICD-10-CM | POA: Diagnosis not present

## 2017-10-18 DIAGNOSIS — I69354 Hemiplegia and hemiparesis following cerebral infarction affecting left non-dominant side: Secondary | ICD-10-CM | POA: Diagnosis not present

## 2017-10-18 DIAGNOSIS — F0150 Vascular dementia without behavioral disturbance: Secondary | ICD-10-CM | POA: Diagnosis not present

## 2017-10-18 DIAGNOSIS — I69318 Other symptoms and signs involving cognitive functions following cerebral infarction: Secondary | ICD-10-CM | POA: Diagnosis not present

## 2017-10-18 DIAGNOSIS — D631 Anemia in chronic kidney disease: Secondary | ICD-10-CM | POA: Diagnosis not present

## 2017-10-18 DIAGNOSIS — N184 Chronic kidney disease, stage 4 (severe): Secondary | ICD-10-CM | POA: Diagnosis not present

## 2017-10-18 DIAGNOSIS — I129 Hypertensive chronic kidney disease with stage 1 through stage 4 chronic kidney disease, or unspecified chronic kidney disease: Secondary | ICD-10-CM | POA: Diagnosis not present

## 2017-10-19 DIAGNOSIS — N184 Chronic kidney disease, stage 4 (severe): Secondary | ICD-10-CM | POA: Diagnosis not present

## 2017-10-19 DIAGNOSIS — F0150 Vascular dementia without behavioral disturbance: Secondary | ICD-10-CM | POA: Diagnosis not present

## 2017-10-19 DIAGNOSIS — I129 Hypertensive chronic kidney disease with stage 1 through stage 4 chronic kidney disease, or unspecified chronic kidney disease: Secondary | ICD-10-CM | POA: Diagnosis not present

## 2017-10-19 DIAGNOSIS — D631 Anemia in chronic kidney disease: Secondary | ICD-10-CM | POA: Diagnosis not present

## 2017-10-19 DIAGNOSIS — I69318 Other symptoms and signs involving cognitive functions following cerebral infarction: Secondary | ICD-10-CM | POA: Diagnosis not present

## 2017-10-19 DIAGNOSIS — I69354 Hemiplegia and hemiparesis following cerebral infarction affecting left non-dominant side: Secondary | ICD-10-CM | POA: Diagnosis not present

## 2017-10-20 DIAGNOSIS — N184 Chronic kidney disease, stage 4 (severe): Secondary | ICD-10-CM | POA: Diagnosis not present

## 2017-10-20 DIAGNOSIS — I69318 Other symptoms and signs involving cognitive functions following cerebral infarction: Secondary | ICD-10-CM | POA: Diagnosis not present

## 2017-10-20 DIAGNOSIS — D631 Anemia in chronic kidney disease: Secondary | ICD-10-CM | POA: Diagnosis not present

## 2017-10-20 DIAGNOSIS — F0150 Vascular dementia without behavioral disturbance: Secondary | ICD-10-CM | POA: Diagnosis not present

## 2017-10-20 DIAGNOSIS — I129 Hypertensive chronic kidney disease with stage 1 through stage 4 chronic kidney disease, or unspecified chronic kidney disease: Secondary | ICD-10-CM | POA: Diagnosis not present

## 2017-10-20 DIAGNOSIS — I69354 Hemiplegia and hemiparesis following cerebral infarction affecting left non-dominant side: Secondary | ICD-10-CM | POA: Diagnosis not present

## 2017-10-21 DIAGNOSIS — F0150 Vascular dementia without behavioral disturbance: Secondary | ICD-10-CM | POA: Diagnosis not present

## 2017-10-21 DIAGNOSIS — I129 Hypertensive chronic kidney disease with stage 1 through stage 4 chronic kidney disease, or unspecified chronic kidney disease: Secondary | ICD-10-CM | POA: Diagnosis not present

## 2017-10-21 DIAGNOSIS — I69318 Other symptoms and signs involving cognitive functions following cerebral infarction: Secondary | ICD-10-CM | POA: Diagnosis not present

## 2017-10-21 DIAGNOSIS — N184 Chronic kidney disease, stage 4 (severe): Secondary | ICD-10-CM | POA: Diagnosis not present

## 2017-10-21 DIAGNOSIS — I69354 Hemiplegia and hemiparesis following cerebral infarction affecting left non-dominant side: Secondary | ICD-10-CM | POA: Diagnosis not present

## 2017-10-21 DIAGNOSIS — D631 Anemia in chronic kidney disease: Secondary | ICD-10-CM | POA: Diagnosis not present

## 2017-10-23 DIAGNOSIS — R627 Adult failure to thrive: Secondary | ICD-10-CM | POA: Diagnosis not present

## 2017-10-23 DIAGNOSIS — I69354 Hemiplegia and hemiparesis following cerebral infarction affecting left non-dominant side: Secondary | ICD-10-CM | POA: Diagnosis not present

## 2017-10-23 DIAGNOSIS — S5291XD Unspecified fracture of right forearm, subsequent encounter for closed fracture with routine healing: Secondary | ICD-10-CM | POA: Diagnosis not present

## 2017-10-23 DIAGNOSIS — M21372 Foot drop, left foot: Secondary | ICD-10-CM | POA: Diagnosis not present

## 2017-10-23 DIAGNOSIS — F411 Generalized anxiety disorder: Secondary | ICD-10-CM | POA: Diagnosis not present

## 2017-10-23 DIAGNOSIS — F0150 Vascular dementia without behavioral disturbance: Secondary | ICD-10-CM | POA: Diagnosis not present

## 2017-10-23 DIAGNOSIS — Z741 Need for assistance with personal care: Secondary | ICD-10-CM | POA: Diagnosis not present

## 2017-10-23 DIAGNOSIS — H3530 Unspecified macular degeneration: Secondary | ICD-10-CM | POA: Diagnosis not present

## 2017-10-23 DIAGNOSIS — S52201D Unspecified fracture of shaft of right ulna, subsequent encounter for closed fracture with routine healing: Secondary | ICD-10-CM | POA: Diagnosis not present

## 2017-10-23 DIAGNOSIS — H9110 Presbycusis, unspecified ear: Secondary | ICD-10-CM | POA: Diagnosis not present

## 2017-10-23 DIAGNOSIS — K921 Melena: Secondary | ICD-10-CM | POA: Diagnosis not present

## 2017-10-23 DIAGNOSIS — F329 Major depressive disorder, single episode, unspecified: Secondary | ICD-10-CM | POA: Diagnosis not present

## 2017-10-23 DIAGNOSIS — R011 Cardiac murmur, unspecified: Secondary | ICD-10-CM | POA: Diagnosis not present

## 2017-10-23 DIAGNOSIS — R634 Abnormal weight loss: Secondary | ICD-10-CM | POA: Diagnosis not present

## 2017-10-23 DIAGNOSIS — D631 Anemia in chronic kidney disease: Secondary | ICD-10-CM | POA: Diagnosis not present

## 2017-10-23 DIAGNOSIS — M1711 Unilateral primary osteoarthritis, right knee: Secondary | ICD-10-CM | POA: Diagnosis not present

## 2017-10-23 DIAGNOSIS — E785 Hyperlipidemia, unspecified: Secondary | ICD-10-CM | POA: Diagnosis not present

## 2017-10-23 DIAGNOSIS — N184 Chronic kidney disease, stage 4 (severe): Secondary | ICD-10-CM | POA: Diagnosis not present

## 2017-10-23 DIAGNOSIS — I69318 Other symptoms and signs involving cognitive functions following cerebral infarction: Secondary | ICD-10-CM | POA: Diagnosis not present

## 2017-10-23 DIAGNOSIS — J309 Allergic rhinitis, unspecified: Secondary | ICD-10-CM | POA: Diagnosis not present

## 2017-10-23 DIAGNOSIS — N390 Urinary tract infection, site not specified: Secondary | ICD-10-CM | POA: Diagnosis not present

## 2017-10-23 DIAGNOSIS — Z9981 Dependence on supplemental oxygen: Secondary | ICD-10-CM | POA: Diagnosis not present

## 2017-10-23 DIAGNOSIS — R296 Repeated falls: Secondary | ICD-10-CM | POA: Diagnosis not present

## 2017-10-23 DIAGNOSIS — R0600 Dyspnea, unspecified: Secondary | ICD-10-CM | POA: Diagnosis not present

## 2017-10-23 DIAGNOSIS — I129 Hypertensive chronic kidney disease with stage 1 through stage 4 chronic kidney disease, or unspecified chronic kidney disease: Secondary | ICD-10-CM | POA: Diagnosis not present

## 2017-10-24 DIAGNOSIS — I69318 Other symptoms and signs involving cognitive functions following cerebral infarction: Secondary | ICD-10-CM | POA: Diagnosis not present

## 2017-10-24 DIAGNOSIS — N184 Chronic kidney disease, stage 4 (severe): Secondary | ICD-10-CM | POA: Diagnosis not present

## 2017-10-24 DIAGNOSIS — F0150 Vascular dementia without behavioral disturbance: Secondary | ICD-10-CM | POA: Diagnosis not present

## 2017-10-24 DIAGNOSIS — I129 Hypertensive chronic kidney disease with stage 1 through stage 4 chronic kidney disease, or unspecified chronic kidney disease: Secondary | ICD-10-CM | POA: Diagnosis not present

## 2017-10-24 DIAGNOSIS — D631 Anemia in chronic kidney disease: Secondary | ICD-10-CM | POA: Diagnosis not present

## 2017-10-24 DIAGNOSIS — I69354 Hemiplegia and hemiparesis following cerebral infarction affecting left non-dominant side: Secondary | ICD-10-CM | POA: Diagnosis not present

## 2017-10-25 DIAGNOSIS — I129 Hypertensive chronic kidney disease with stage 1 through stage 4 chronic kidney disease, or unspecified chronic kidney disease: Secondary | ICD-10-CM | POA: Diagnosis not present

## 2017-10-25 DIAGNOSIS — I69318 Other symptoms and signs involving cognitive functions following cerebral infarction: Secondary | ICD-10-CM | POA: Diagnosis not present

## 2017-10-25 DIAGNOSIS — N184 Chronic kidney disease, stage 4 (severe): Secondary | ICD-10-CM | POA: Diagnosis not present

## 2017-10-25 DIAGNOSIS — F0150 Vascular dementia without behavioral disturbance: Secondary | ICD-10-CM | POA: Diagnosis not present

## 2017-10-25 DIAGNOSIS — I69354 Hemiplegia and hemiparesis following cerebral infarction affecting left non-dominant side: Secondary | ICD-10-CM | POA: Diagnosis not present

## 2017-10-25 DIAGNOSIS — D631 Anemia in chronic kidney disease: Secondary | ICD-10-CM | POA: Diagnosis not present

## 2017-10-26 DIAGNOSIS — D631 Anemia in chronic kidney disease: Secondary | ICD-10-CM | POA: Diagnosis not present

## 2017-10-26 DIAGNOSIS — N184 Chronic kidney disease, stage 4 (severe): Secondary | ICD-10-CM | POA: Diagnosis not present

## 2017-10-26 DIAGNOSIS — F0150 Vascular dementia without behavioral disturbance: Secondary | ICD-10-CM | POA: Diagnosis not present

## 2017-10-26 DIAGNOSIS — I69318 Other symptoms and signs involving cognitive functions following cerebral infarction: Secondary | ICD-10-CM | POA: Diagnosis not present

## 2017-10-26 DIAGNOSIS — I129 Hypertensive chronic kidney disease with stage 1 through stage 4 chronic kidney disease, or unspecified chronic kidney disease: Secondary | ICD-10-CM | POA: Diagnosis not present

## 2017-10-26 DIAGNOSIS — I69354 Hemiplegia and hemiparesis following cerebral infarction affecting left non-dominant side: Secondary | ICD-10-CM | POA: Diagnosis not present

## 2017-10-27 DIAGNOSIS — N184 Chronic kidney disease, stage 4 (severe): Secondary | ICD-10-CM | POA: Diagnosis not present

## 2017-10-27 DIAGNOSIS — D631 Anemia in chronic kidney disease: Secondary | ICD-10-CM | POA: Diagnosis not present

## 2017-10-27 DIAGNOSIS — I129 Hypertensive chronic kidney disease with stage 1 through stage 4 chronic kidney disease, or unspecified chronic kidney disease: Secondary | ICD-10-CM | POA: Diagnosis not present

## 2017-10-27 DIAGNOSIS — I69318 Other symptoms and signs involving cognitive functions following cerebral infarction: Secondary | ICD-10-CM | POA: Diagnosis not present

## 2017-10-27 DIAGNOSIS — I69354 Hemiplegia and hemiparesis following cerebral infarction affecting left non-dominant side: Secondary | ICD-10-CM | POA: Diagnosis not present

## 2017-10-27 DIAGNOSIS — F0150 Vascular dementia without behavioral disturbance: Secondary | ICD-10-CM | POA: Diagnosis not present

## 2017-10-28 DIAGNOSIS — N184 Chronic kidney disease, stage 4 (severe): Secondary | ICD-10-CM | POA: Diagnosis not present

## 2017-10-28 DIAGNOSIS — I69354 Hemiplegia and hemiparesis following cerebral infarction affecting left non-dominant side: Secondary | ICD-10-CM | POA: Diagnosis not present

## 2017-10-28 DIAGNOSIS — I69318 Other symptoms and signs involving cognitive functions following cerebral infarction: Secondary | ICD-10-CM | POA: Diagnosis not present

## 2017-10-28 DIAGNOSIS — F0150 Vascular dementia without behavioral disturbance: Secondary | ICD-10-CM | POA: Diagnosis not present

## 2017-10-28 DIAGNOSIS — I129 Hypertensive chronic kidney disease with stage 1 through stage 4 chronic kidney disease, or unspecified chronic kidney disease: Secondary | ICD-10-CM | POA: Diagnosis not present

## 2017-10-28 DIAGNOSIS — D631 Anemia in chronic kidney disease: Secondary | ICD-10-CM | POA: Diagnosis not present

## 2017-10-31 DIAGNOSIS — I129 Hypertensive chronic kidney disease with stage 1 through stage 4 chronic kidney disease, or unspecified chronic kidney disease: Secondary | ICD-10-CM | POA: Diagnosis not present

## 2017-10-31 DIAGNOSIS — N184 Chronic kidney disease, stage 4 (severe): Secondary | ICD-10-CM | POA: Diagnosis not present

## 2017-10-31 DIAGNOSIS — I69354 Hemiplegia and hemiparesis following cerebral infarction affecting left non-dominant side: Secondary | ICD-10-CM | POA: Diagnosis not present

## 2017-10-31 DIAGNOSIS — D631 Anemia in chronic kidney disease: Secondary | ICD-10-CM | POA: Diagnosis not present

## 2017-10-31 DIAGNOSIS — F0150 Vascular dementia without behavioral disturbance: Secondary | ICD-10-CM | POA: Diagnosis not present

## 2017-10-31 DIAGNOSIS — I69318 Other symptoms and signs involving cognitive functions following cerebral infarction: Secondary | ICD-10-CM | POA: Diagnosis not present

## 2017-11-01 DIAGNOSIS — F0150 Vascular dementia without behavioral disturbance: Secondary | ICD-10-CM | POA: Diagnosis not present

## 2017-11-01 DIAGNOSIS — N184 Chronic kidney disease, stage 4 (severe): Secondary | ICD-10-CM | POA: Diagnosis not present

## 2017-11-01 DIAGNOSIS — I129 Hypertensive chronic kidney disease with stage 1 through stage 4 chronic kidney disease, or unspecified chronic kidney disease: Secondary | ICD-10-CM | POA: Diagnosis not present

## 2017-11-01 DIAGNOSIS — I69354 Hemiplegia and hemiparesis following cerebral infarction affecting left non-dominant side: Secondary | ICD-10-CM | POA: Diagnosis not present

## 2017-11-01 DIAGNOSIS — I69318 Other symptoms and signs involving cognitive functions following cerebral infarction: Secondary | ICD-10-CM | POA: Diagnosis not present

## 2017-11-01 DIAGNOSIS — D631 Anemia in chronic kidney disease: Secondary | ICD-10-CM | POA: Diagnosis not present

## 2017-11-02 DIAGNOSIS — D631 Anemia in chronic kidney disease: Secondary | ICD-10-CM | POA: Diagnosis not present

## 2017-11-02 DIAGNOSIS — F0150 Vascular dementia without behavioral disturbance: Secondary | ICD-10-CM | POA: Diagnosis not present

## 2017-11-02 DIAGNOSIS — I129 Hypertensive chronic kidney disease with stage 1 through stage 4 chronic kidney disease, or unspecified chronic kidney disease: Secondary | ICD-10-CM | POA: Diagnosis not present

## 2017-11-02 DIAGNOSIS — I69354 Hemiplegia and hemiparesis following cerebral infarction affecting left non-dominant side: Secondary | ICD-10-CM | POA: Diagnosis not present

## 2017-11-02 DIAGNOSIS — I69318 Other symptoms and signs involving cognitive functions following cerebral infarction: Secondary | ICD-10-CM | POA: Diagnosis not present

## 2017-11-02 DIAGNOSIS — N184 Chronic kidney disease, stage 4 (severe): Secondary | ICD-10-CM | POA: Diagnosis not present

## 2017-11-03 DIAGNOSIS — D631 Anemia in chronic kidney disease: Secondary | ICD-10-CM | POA: Diagnosis not present

## 2017-11-03 DIAGNOSIS — I129 Hypertensive chronic kidney disease with stage 1 through stage 4 chronic kidney disease, or unspecified chronic kidney disease: Secondary | ICD-10-CM | POA: Diagnosis not present

## 2017-11-03 DIAGNOSIS — N184 Chronic kidney disease, stage 4 (severe): Secondary | ICD-10-CM | POA: Diagnosis not present

## 2017-11-03 DIAGNOSIS — I69318 Other symptoms and signs involving cognitive functions following cerebral infarction: Secondary | ICD-10-CM | POA: Diagnosis not present

## 2017-11-03 DIAGNOSIS — I69354 Hemiplegia and hemiparesis following cerebral infarction affecting left non-dominant side: Secondary | ICD-10-CM | POA: Diagnosis not present

## 2017-11-03 DIAGNOSIS — F0150 Vascular dementia without behavioral disturbance: Secondary | ICD-10-CM | POA: Diagnosis not present

## 2017-11-04 DIAGNOSIS — I69318 Other symptoms and signs involving cognitive functions following cerebral infarction: Secondary | ICD-10-CM | POA: Diagnosis not present

## 2017-11-04 DIAGNOSIS — F0150 Vascular dementia without behavioral disturbance: Secondary | ICD-10-CM | POA: Diagnosis not present

## 2017-11-04 DIAGNOSIS — I69354 Hemiplegia and hemiparesis following cerebral infarction affecting left non-dominant side: Secondary | ICD-10-CM | POA: Diagnosis not present

## 2017-11-04 DIAGNOSIS — D631 Anemia in chronic kidney disease: Secondary | ICD-10-CM | POA: Diagnosis not present

## 2017-11-04 DIAGNOSIS — N184 Chronic kidney disease, stage 4 (severe): Secondary | ICD-10-CM | POA: Diagnosis not present

## 2017-11-04 DIAGNOSIS — I129 Hypertensive chronic kidney disease with stage 1 through stage 4 chronic kidney disease, or unspecified chronic kidney disease: Secondary | ICD-10-CM | POA: Diagnosis not present

## 2017-11-07 DIAGNOSIS — F0150 Vascular dementia without behavioral disturbance: Secondary | ICD-10-CM | POA: Diagnosis not present

## 2017-11-07 DIAGNOSIS — D631 Anemia in chronic kidney disease: Secondary | ICD-10-CM | POA: Diagnosis not present

## 2017-11-07 DIAGNOSIS — N184 Chronic kidney disease, stage 4 (severe): Secondary | ICD-10-CM | POA: Diagnosis not present

## 2017-11-07 DIAGNOSIS — I69318 Other symptoms and signs involving cognitive functions following cerebral infarction: Secondary | ICD-10-CM | POA: Diagnosis not present

## 2017-11-07 DIAGNOSIS — I69354 Hemiplegia and hemiparesis following cerebral infarction affecting left non-dominant side: Secondary | ICD-10-CM | POA: Diagnosis not present

## 2017-11-07 DIAGNOSIS — I129 Hypertensive chronic kidney disease with stage 1 through stage 4 chronic kidney disease, or unspecified chronic kidney disease: Secondary | ICD-10-CM | POA: Diagnosis not present

## 2017-11-08 DIAGNOSIS — I129 Hypertensive chronic kidney disease with stage 1 through stage 4 chronic kidney disease, or unspecified chronic kidney disease: Secondary | ICD-10-CM | POA: Diagnosis not present

## 2017-11-08 DIAGNOSIS — I69354 Hemiplegia and hemiparesis following cerebral infarction affecting left non-dominant side: Secondary | ICD-10-CM | POA: Diagnosis not present

## 2017-11-08 DIAGNOSIS — I69318 Other symptoms and signs involving cognitive functions following cerebral infarction: Secondary | ICD-10-CM | POA: Diagnosis not present

## 2017-11-08 DIAGNOSIS — N184 Chronic kidney disease, stage 4 (severe): Secondary | ICD-10-CM | POA: Diagnosis not present

## 2017-11-08 DIAGNOSIS — F0150 Vascular dementia without behavioral disturbance: Secondary | ICD-10-CM | POA: Diagnosis not present

## 2017-11-08 DIAGNOSIS — D631 Anemia in chronic kidney disease: Secondary | ICD-10-CM | POA: Diagnosis not present

## 2017-11-09 DIAGNOSIS — N184 Chronic kidney disease, stage 4 (severe): Secondary | ICD-10-CM | POA: Diagnosis not present

## 2017-11-09 DIAGNOSIS — I69354 Hemiplegia and hemiparesis following cerebral infarction affecting left non-dominant side: Secondary | ICD-10-CM | POA: Diagnosis not present

## 2017-11-09 DIAGNOSIS — D631 Anemia in chronic kidney disease: Secondary | ICD-10-CM | POA: Diagnosis not present

## 2017-11-09 DIAGNOSIS — I69318 Other symptoms and signs involving cognitive functions following cerebral infarction: Secondary | ICD-10-CM | POA: Diagnosis not present

## 2017-11-09 DIAGNOSIS — F0150 Vascular dementia without behavioral disturbance: Secondary | ICD-10-CM | POA: Diagnosis not present

## 2017-11-09 DIAGNOSIS — I129 Hypertensive chronic kidney disease with stage 1 through stage 4 chronic kidney disease, or unspecified chronic kidney disease: Secondary | ICD-10-CM | POA: Diagnosis not present

## 2017-11-10 DIAGNOSIS — I69354 Hemiplegia and hemiparesis following cerebral infarction affecting left non-dominant side: Secondary | ICD-10-CM | POA: Diagnosis not present

## 2017-11-10 DIAGNOSIS — D631 Anemia in chronic kidney disease: Secondary | ICD-10-CM | POA: Diagnosis not present

## 2017-11-10 DIAGNOSIS — I129 Hypertensive chronic kidney disease with stage 1 through stage 4 chronic kidney disease, or unspecified chronic kidney disease: Secondary | ICD-10-CM | POA: Diagnosis not present

## 2017-11-10 DIAGNOSIS — I69318 Other symptoms and signs involving cognitive functions following cerebral infarction: Secondary | ICD-10-CM | POA: Diagnosis not present

## 2017-11-10 DIAGNOSIS — F0150 Vascular dementia without behavioral disturbance: Secondary | ICD-10-CM | POA: Diagnosis not present

## 2017-11-10 DIAGNOSIS — N184 Chronic kidney disease, stage 4 (severe): Secondary | ICD-10-CM | POA: Diagnosis not present

## 2017-11-11 DIAGNOSIS — I129 Hypertensive chronic kidney disease with stage 1 through stage 4 chronic kidney disease, or unspecified chronic kidney disease: Secondary | ICD-10-CM | POA: Diagnosis not present

## 2017-11-11 DIAGNOSIS — I69354 Hemiplegia and hemiparesis following cerebral infarction affecting left non-dominant side: Secondary | ICD-10-CM | POA: Diagnosis not present

## 2017-11-11 DIAGNOSIS — F0150 Vascular dementia without behavioral disturbance: Secondary | ICD-10-CM | POA: Diagnosis not present

## 2017-11-11 DIAGNOSIS — N184 Chronic kidney disease, stage 4 (severe): Secondary | ICD-10-CM | POA: Diagnosis not present

## 2017-11-11 DIAGNOSIS — I69318 Other symptoms and signs involving cognitive functions following cerebral infarction: Secondary | ICD-10-CM | POA: Diagnosis not present

## 2017-11-11 DIAGNOSIS — D631 Anemia in chronic kidney disease: Secondary | ICD-10-CM | POA: Diagnosis not present

## 2017-11-12 ENCOUNTER — Telehealth: Payer: Self-pay | Admitting: Family Medicine

## 2017-11-12 DIAGNOSIS — I69354 Hemiplegia and hemiparesis following cerebral infarction affecting left non-dominant side: Secondary | ICD-10-CM | POA: Diagnosis not present

## 2017-11-12 DIAGNOSIS — F0150 Vascular dementia without behavioral disturbance: Secondary | ICD-10-CM | POA: Diagnosis not present

## 2017-11-12 DIAGNOSIS — I69318 Other symptoms and signs involving cognitive functions following cerebral infarction: Secondary | ICD-10-CM | POA: Diagnosis not present

## 2017-11-12 DIAGNOSIS — N184 Chronic kidney disease, stage 4 (severe): Secondary | ICD-10-CM | POA: Diagnosis not present

## 2017-11-12 DIAGNOSIS — D631 Anemia in chronic kidney disease: Secondary | ICD-10-CM | POA: Diagnosis not present

## 2017-11-12 DIAGNOSIS — I129 Hypertensive chronic kidney disease with stage 1 through stage 4 chronic kidney disease, or unspecified chronic kidney disease: Secondary | ICD-10-CM | POA: Diagnosis not present

## 2017-11-12 NOTE — Telephone Encounter (Signed)
Received after hours call from Hospice at [facility], requesting xray for [facility]. Reports Cindy Harvey sustained a fall 1am 11/11/2017. She was able to continue ambulating until this morning she endorsed pain with ambulation. Per report, she was evaluated by nursing staff and is experiencing right hip pain. Her family is with her today requesting "Mobile unit xray" of the right hip.  I called and spoke to her nurse Opal Sidles personally- which reports her vitals are stable and there is no bruising or hematoma formation. She was given tylenol for the discomfort. There is no information available concerning the mechanics of the fall. Pt is reported to be in her usual condition, with the exception of discomfort with ambulation/weight bearing.  - agree to xray of pelvis/right hip and femur by the mobile unit requested by family. - Verbal orders given to Jane--> . If fracture present, pain worsens or vitals unstable --> she will need triaged in the ED for management immediately.  - If normal xray, care team to follow up with her on Monday morning.    Electronically Signed by: Howard Pouch, DO [place] [facility]

## 2017-11-12 NOTE — Telephone Encounter (Signed)
Thank you Maryfrances Bunnell

## 2017-11-14 ENCOUNTER — Telehealth: Payer: Self-pay | Admitting: Internal Medicine

## 2017-11-14 DIAGNOSIS — D631 Anemia in chronic kidney disease: Secondary | ICD-10-CM | POA: Diagnosis not present

## 2017-11-14 DIAGNOSIS — F0150 Vascular dementia without behavioral disturbance: Secondary | ICD-10-CM | POA: Diagnosis not present

## 2017-11-14 DIAGNOSIS — I129 Hypertensive chronic kidney disease with stage 1 through stage 4 chronic kidney disease, or unspecified chronic kidney disease: Secondary | ICD-10-CM | POA: Diagnosis not present

## 2017-11-14 DIAGNOSIS — I69318 Other symptoms and signs involving cognitive functions following cerebral infarction: Secondary | ICD-10-CM | POA: Diagnosis not present

## 2017-11-14 DIAGNOSIS — N184 Chronic kidney disease, stage 4 (severe): Secondary | ICD-10-CM | POA: Diagnosis not present

## 2017-11-14 DIAGNOSIS — I69354 Hemiplegia and hemiparesis following cerebral infarction affecting left non-dominant side: Secondary | ICD-10-CM | POA: Diagnosis not present

## 2017-11-14 MED ORDER — ACETAMINOPHEN 160 MG/5ML PO SUSP: 640.0000 mg | Freq: Four times a day (QID) | ORAL | 0 refills | Status: DC | PRN

## 2017-11-14 MED ORDER — PAROXETINE HCL 10 MG/5ML PO SUSP: 10.0000 mg | ORAL | 0 refills | Status: DC

## 2017-11-14 NOTE — Telephone Encounter (Signed)
Request for medication adjustment

## 2017-11-14 NOTE — Telephone Encounter (Signed)
Copied from [place] [id number]. Topic: Quick Communication - See Telephone Encounter >> Nov 14, 2017 11:07 AM Mylinda Latina, NT wrote: CRM for notification. See Telephone encounter for: 11/14/17. Erline Levine calling from hospice states that the patient had a fall on Friday . They came out to access her pain level Friday. Erline Levine stated that patient seemed to be ok on Friday. They came out on Sat. Patient was still in some pain. An xray was performed. The xray showed a pelvic fx on the right side . The patient has been having her rest more. Erline Levine is requesting verbal order for her acetaminophen (TYLENOL) 160 MG/5ML liquid to be increase 3x a day. Erline Levine prefer the 22ml of the Tylenol.  Erline Levine states that the PARoxetine (PAXIL) 10 MG tablet is being crushed and that medication can't be crushed. The patient only takes medication crushed or liquid form . She is wondering if the Paxil can be in liquid form just for 2 weeks then re access.If possible then change over to Zoloft. Please call Erline Levine with these verbals.  CB# [phone]

## 2017-11-14 NOTE — Telephone Encounter (Signed)
Changed paxil  To liquid suspension:   2 week supply,,  And liquid tylenol order for 640 mg every 6 hours  bvoth sent to [facility]

## 2017-11-15 DIAGNOSIS — N184 Chronic kidney disease, stage 4 (severe): Secondary | ICD-10-CM | POA: Diagnosis not present

## 2017-11-15 DIAGNOSIS — I69354 Hemiplegia and hemiparesis following cerebral infarction affecting left non-dominant side: Secondary | ICD-10-CM | POA: Diagnosis not present

## 2017-11-15 DIAGNOSIS — D631 Anemia in chronic kidney disease: Secondary | ICD-10-CM | POA: Diagnosis not present

## 2017-11-15 DIAGNOSIS — I129 Hypertensive chronic kidney disease with stage 1 through stage 4 chronic kidney disease, or unspecified chronic kidney disease: Secondary | ICD-10-CM | POA: Diagnosis not present

## 2017-11-15 DIAGNOSIS — F0150 Vascular dementia without behavioral disturbance: Secondary | ICD-10-CM | POA: Diagnosis not present

## 2017-11-15 DIAGNOSIS — I69318 Other symptoms and signs involving cognitive functions following cerebral infarction: Secondary | ICD-10-CM | POA: Diagnosis not present

## 2017-11-15 NOTE — Telephone Encounter (Signed)
Verbal orders were given to [facility] with Hospice.

## 2017-11-16 ENCOUNTER — Other Ambulatory Visit: Payer: Self-pay | Admitting: Internal Medicine

## 2017-11-16 ENCOUNTER — Telehealth: Payer: Self-pay | Admitting: *Deleted

## 2017-11-16 DIAGNOSIS — F0150 Vascular dementia without behavioral disturbance: Secondary | ICD-10-CM | POA: Diagnosis not present

## 2017-11-16 DIAGNOSIS — N184 Chronic kidney disease, stage 4 (severe): Secondary | ICD-10-CM | POA: Diagnosis not present

## 2017-11-16 DIAGNOSIS — D631 Anemia in chronic kidney disease: Secondary | ICD-10-CM | POA: Diagnosis not present

## 2017-11-16 DIAGNOSIS — I69318 Other symptoms and signs involving cognitive functions following cerebral infarction: Secondary | ICD-10-CM | POA: Diagnosis not present

## 2017-11-16 DIAGNOSIS — I69354 Hemiplegia and hemiparesis following cerebral infarction affecting left non-dominant side: Secondary | ICD-10-CM | POA: Diagnosis not present

## 2017-11-16 DIAGNOSIS — I129 Hypertensive chronic kidney disease with stage 1 through stage 4 chronic kidney disease, or unspecified chronic kidney disease: Secondary | ICD-10-CM | POA: Diagnosis not present

## 2017-11-16 MED ORDER — OXYCODONE-ACETAMINOPHEN 5-325 MG PO TABS: ORAL_TABLET | ORAL | 0 refills | Status: DC

## 2017-11-16 NOTE — Progress Notes (Unsigned)
Percocet rx orderd (2) one for scheduled dosing,  One for prn use.  Both have been   Printed and signed,  In orange folder  Fax to pharmacy on order  sheet

## 2017-11-16 NOTE — Telephone Encounter (Signed)
Copied from [place] [phone]. Topic: Quick Communication - See Telephone Encounter >> Nov 16, 2017  1:02 PM Antonieta Iba C wrote: CRM for notification. See Telephone encounter for: 11/16/17.  Stacy w/ Hospice Nurse called In to make provider aware that she will be faxing over an order for pain medication for pt. Also she said that they have to receive Rx for full dosage they are unable to accept half dosage. CB: [phone]

## 2017-11-16 NOTE — Telephone Encounter (Signed)
Forms signed and returned to [place] in  orange folder

## 2017-11-16 NOTE — Telephone Encounter (Signed)
Hospice called back and needs a script and the orders that were sent were not signed ,

## 2017-11-16 NOTE — Telephone Encounter (Signed)
Faxed to Hospice

## 2017-11-17 DIAGNOSIS — D631 Anemia in chronic kidney disease: Secondary | ICD-10-CM | POA: Diagnosis not present

## 2017-11-17 DIAGNOSIS — I69354 Hemiplegia and hemiparesis following cerebral infarction affecting left non-dominant side: Secondary | ICD-10-CM | POA: Diagnosis not present

## 2017-11-17 DIAGNOSIS — I69318 Other symptoms and signs involving cognitive functions following cerebral infarction: Secondary | ICD-10-CM | POA: Diagnosis not present

## 2017-11-17 DIAGNOSIS — I129 Hypertensive chronic kidney disease with stage 1 through stage 4 chronic kidney disease, or unspecified chronic kidney disease: Secondary | ICD-10-CM | POA: Diagnosis not present

## 2017-11-17 DIAGNOSIS — F0150 Vascular dementia without behavioral disturbance: Secondary | ICD-10-CM | POA: Diagnosis not present

## 2017-11-17 DIAGNOSIS — N184 Chronic kidney disease, stage 4 (severe): Secondary | ICD-10-CM | POA: Diagnosis not present

## 2017-11-17 NOTE — Progress Notes (Signed)
faxed

## 2017-11-18 ENCOUNTER — Telehealth: Payer: Self-pay | Admitting: Internal Medicine

## 2017-11-18 DIAGNOSIS — D631 Anemia in chronic kidney disease: Secondary | ICD-10-CM | POA: Diagnosis not present

## 2017-11-18 DIAGNOSIS — I69354 Hemiplegia and hemiparesis following cerebral infarction affecting left non-dominant side: Secondary | ICD-10-CM | POA: Diagnosis not present

## 2017-11-18 DIAGNOSIS — N184 Chronic kidney disease, stage 4 (severe): Secondary | ICD-10-CM | POA: Diagnosis not present

## 2017-11-18 DIAGNOSIS — F0150 Vascular dementia without behavioral disturbance: Secondary | ICD-10-CM | POA: Diagnosis not present

## 2017-11-18 DIAGNOSIS — I129 Hypertensive chronic kidney disease with stage 1 through stage 4 chronic kidney disease, or unspecified chronic kidney disease: Secondary | ICD-10-CM | POA: Diagnosis not present

## 2017-11-18 DIAGNOSIS — I69318 Other symptoms and signs involving cognitive functions following cerebral infarction: Secondary | ICD-10-CM | POA: Diagnosis not present

## 2017-11-18 MED ORDER — IPRATROPIUM-ALBUTEROL 0.5-2.5 (3) MG/3ML IN SOLN: 3.0000 mL | Freq: Two times a day (BID) | RESPIRATORY_TRACT | 0 refills | Status: DC

## 2017-11-18 NOTE — Telephone Encounter (Signed)
Copied from [place] [id number]. Topic: General - Other >> Nov 18, 2017  2:53 PM Margot Ables wrote: Reason for CRM: Percocet is helping with the pain but pt is developing a cough. Ronchi in her lungs. Requesting Duoneb 2x day for 3 days. Please call to advise.  [place], [place] [address] [phone] (Phone) [phone] (Fax)

## 2017-11-18 NOTE — Telephone Encounter (Signed)
rx request 

## 2017-11-18 NOTE — Telephone Encounter (Signed)
Per Dr. Derrel Nip medication was sent to pharmacy requested.

## 2017-11-21 ENCOUNTER — Telehealth: Payer: Self-pay | Admitting: Internal Medicine

## 2017-11-21 DIAGNOSIS — I69318 Other symptoms and signs involving cognitive functions following cerebral infarction: Secondary | ICD-10-CM | POA: Diagnosis not present

## 2017-11-21 DIAGNOSIS — D631 Anemia in chronic kidney disease: Secondary | ICD-10-CM | POA: Diagnosis not present

## 2017-11-21 DIAGNOSIS — I129 Hypertensive chronic kidney disease with stage 1 through stage 4 chronic kidney disease, or unspecified chronic kidney disease: Secondary | ICD-10-CM | POA: Diagnosis not present

## 2017-11-21 DIAGNOSIS — F0150 Vascular dementia without behavioral disturbance: Secondary | ICD-10-CM | POA: Diagnosis not present

## 2017-11-21 DIAGNOSIS — I69354 Hemiplegia and hemiparesis following cerebral infarction affecting left non-dominant side: Secondary | ICD-10-CM | POA: Diagnosis not present

## 2017-11-21 DIAGNOSIS — N184 Chronic kidney disease, stage 4 (severe): Secondary | ICD-10-CM | POA: Diagnosis not present

## 2017-11-21 NOTE — Telephone Encounter (Signed)
rx request 

## 2017-11-21 NOTE — Telephone Encounter (Signed)
Copied from [place] [id number]. Topic: Quick Communication - Rx Refill/Question >> Nov 21, 2017  2:35 PM Margot Ables wrote: Medication: the pts daughter thinks 3x a day for the percocet is too much for her - she has had some nausea and vomiting - can this be reduced to 2x day?  Secure VM if not able to answer. Call back # [phone].

## 2017-11-22 NOTE — Telephone Encounter (Signed)
Placed in quick sign for signature 

## 2017-11-22 NOTE — Telephone Encounter (Signed)
Letter with order change written and printed

## 2017-11-22 NOTE — Telephone Encounter (Signed)
Pt's daughter feels that taking the percocet 3x a day is too much. She stated that the pt is having some nausea and vomiting. Daughter is wanting to know if it can be reduced to twice a day?

## 2017-11-22 NOTE — Telephone Encounter (Signed)
Signed and returned in orange folder

## 2017-11-22 NOTE — Telephone Encounter (Signed)
Stacy from hospice called and would like to change the patients percocet  From 3 x a day to 2 x please call her at please call her at [phone] ok to leave a message

## 2017-11-23 NOTE — Telephone Encounter (Signed)
Spoke with Stacy from Hospice to let her know that Dr. Derrel Nip was okay with changing the percocet to twice a day rather than three times. Marzetta Board they went ahead and changed it yesterday because the pt was way to sedated. Marzetta Board also stated that she did not need a fax.

## 2017-11-25 ENCOUNTER — Telehealth: Payer: Self-pay

## 2017-11-25 NOTE — Telephone Encounter (Signed)
Letter has been faxed.

## 2017-11-25 NOTE — Telephone Encounter (Signed)
Copied from [place] [phone]. Topic: Quick Communication - See Telephone Encounter >> Nov 25, 2017 10:58 AM Antonieta Iba C wrote: CRM for notification. See Telephone encounter for: 11/25/17.  [facility] RN, called in to request to have Rx for oxyCODONE-acetaminophen (PERCOCET) 5-325 MG tablet  to be D/C because it causes nausea and vomiting. She said that pt's pain is better managed and would like to have Rx as needed. She would like to have vo to start pt back on previous Rx for Tylenol liquid. She would like to have this as soon as possible.   CB: [phone]

## 2017-11-25 NOTE — Telephone Encounter (Signed)
Letter printed to dc scheduled oxycodone  Please call to give VO to resume tylenol liquid

## 2017-11-28 NOTE — Telephone Encounter (Signed)
Verbal order to resume liquid tylenol was given.

## 2017-12-08 ENCOUNTER — Telehealth: Payer: Self-pay | Admitting: Internal Medicine

## 2017-12-08 NOTE — Telephone Encounter (Signed)
Copied from [place] [phone]. Topic: Quick Communication - See Telephone Encounter >> Dec 08, 2017 10:55 AM Ahmed Prima L wrote: CRM for notification. See Telephone encounter for: 12/08/17.  Stacie, with [facility] called to say that the paroxetine (PAXIL) 10 MG/5ML suspension is to expensive and they would like to switch her to Zoloft 25 mg once a day. The pharmacy advised her that this is suitable interchange for this. Call back [phone]  Call back @ [phone]

## 2017-12-09 MED ORDER — SERTRALINE HCL 25 MG PO TABS: 25.0000 mg | ORAL_TABLET | Freq: Every day | ORAL | 1 refills | Status: DC

## 2017-12-09 NOTE — Telephone Encounter (Signed)
Letter with orders printed and sgned,  please Fax with order for zoloft to [facility]

## 2017-12-12 NOTE — Telephone Encounter (Signed)
These orders were faxed on Friday.

## 2017-12-13 ENCOUNTER — Telehealth: Payer: Self-pay

## 2017-12-13 NOTE — Telephone Encounter (Signed)
Yes give verbal,  They will send a written order to sign

## 2017-12-13 NOTE — Telephone Encounter (Signed)
Is it okay to give verbal orders?  

## 2017-12-13 NOTE — Telephone Encounter (Signed)
Copied from [place] [phone]. Topic: Quick Communication - Home Health Verbal Orders >> Dec 13, 2017 12:57 PM Judyann Munson wrote: Caller/Agency: Hospice Nurse  Callback Number: [phone] Requesting OT/PT/Skilled Nursing/Social Work:   Calling to decrease the sertraline (ZOLOFT) 25 MG tablet  to 12.5 mg and give in the evening instead of morning. The patient is sleeping a lot and hard to wake her up.

## 2017-12-14 NOTE — Telephone Encounter (Signed)
Verbal orders given  

## 2017-12-30 ENCOUNTER — Telehealth: Payer: Self-pay | Admitting: Internal Medicine

## 2017-12-30 NOTE — Telephone Encounter (Signed)
Verbal orders given  

## 2017-12-30 NOTE — Telephone Encounter (Signed)
Copied from [place] [phone]. Topic: Quick Communication - See Telephone Encounter >> Dec 30, 2017  9:09 AM Ahmed Prima L wrote: CRM for notification. See Telephone encounter for: 12/30/17.  Marzetta Board, RN with [facility] called and said that she keeps complaining of stomach pain. She has been giving her mylanta. The mylanta seems to be working and she is wondering if Dr Derrel Nip would fax a script to hospice for " Mylanta 15 ml " scheduled once daily and 2 prn's as needed daily. She also would like to know could amLODipine (NORVASC) 5 MG tablet be discontinued for 7 days so she can monitor her blood pressure without being on the medication. Her call back number is [phone] and the facility's fax number is [phone]

## 2017-12-30 NOTE — Telephone Encounter (Signed)
Please advise 

## 2018-01-07 ENCOUNTER — Emergency Department

## 2018-01-07 ENCOUNTER — Inpatient Hospital Stay
Admission: EM | Admit: 2018-01-07 | Discharge: 2018-01-09 | DRG: 640 | Disposition: A | Discharge status: Hospice | Attending: Internal Medicine | Admitting: Internal Medicine

## 2018-01-07 ENCOUNTER — Encounter: Payer: Self-pay | Admitting: *Deleted

## 2018-01-07 ENCOUNTER — Other Ambulatory Visit: Payer: Self-pay

## 2018-01-07 DIAGNOSIS — Z886 Allergy status to analgesic agent status: Secondary | ICD-10-CM

## 2018-01-07 DIAGNOSIS — E860 Dehydration: Secondary | ICD-10-CM | POA: Diagnosis not present

## 2018-01-07 DIAGNOSIS — Z79899 Other long term (current) drug therapy: Secondary | ICD-10-CM

## 2018-01-07 DIAGNOSIS — Z888 Allergy status to other drugs, medicaments and biological substances status: Secondary | ICD-10-CM

## 2018-01-07 DIAGNOSIS — M1711 Unilateral primary osteoarthritis, right knee: Secondary | ICD-10-CM | POA: Diagnosis present

## 2018-01-07 DIAGNOSIS — Z8673 Personal history of transient ischemic attack (TIA), and cerebral infarction without residual deficits: Secondary | ICD-10-CM

## 2018-01-07 DIAGNOSIS — H3530 Unspecified macular degeneration: Secondary | ICD-10-CM | POA: Diagnosis present

## 2018-01-07 DIAGNOSIS — R112 Nausea with vomiting, unspecified: Secondary | ICD-10-CM

## 2018-01-07 DIAGNOSIS — I10 Essential (primary) hypertension: Secondary | ICD-10-CM | POA: Diagnosis present

## 2018-01-07 DIAGNOSIS — N179 Acute kidney failure, unspecified: Secondary | ICD-10-CM

## 2018-01-07 DIAGNOSIS — F0390 Unspecified dementia without behavioral disturbance: Secondary | ICD-10-CM | POA: Diagnosis present

## 2018-01-07 DIAGNOSIS — R52 Pain, unspecified: Secondary | ICD-10-CM

## 2018-01-07 DIAGNOSIS — E785 Hyperlipidemia, unspecified: Secondary | ICD-10-CM | POA: Diagnosis present

## 2018-01-07 DIAGNOSIS — Z66 Do not resuscitate: Secondary | ICD-10-CM | POA: Diagnosis present

## 2018-01-07 DIAGNOSIS — R1110 Vomiting, unspecified: Secondary | ICD-10-CM | POA: Diagnosis not present

## 2018-01-07 DIAGNOSIS — K828 Other specified diseases of gallbladder: Secondary | ICD-10-CM | POA: Diagnosis present

## 2018-01-07 DIAGNOSIS — I120 Hypertensive chronic kidney disease with stage 5 chronic kidney disease or end stage renal disease: Secondary | ICD-10-CM | POA: Diagnosis present

## 2018-01-07 DIAGNOSIS — H9190 Unspecified hearing loss, unspecified ear: Secondary | ICD-10-CM | POA: Diagnosis present

## 2018-01-07 DIAGNOSIS — M6281 Muscle weakness (generalized): Secondary | ICD-10-CM | POA: Diagnosis not present

## 2018-01-07 DIAGNOSIS — K529 Noninfective gastroenteritis and colitis, unspecified: Secondary | ICD-10-CM | POA: Diagnosis present

## 2018-01-07 DIAGNOSIS — N186 End stage renal disease: Secondary | ICD-10-CM | POA: Diagnosis present

## 2018-01-07 LAB — COMPREHENSIVE METABOLIC PANEL
ALT: 10 U/L (ref 0–44)
ANION GAP: 11 (ref 5–15)
AST: 16 U/L (ref 15–41)
Albumin: 3.9 g/dL (ref 3.5–5.0)
Alkaline Phosphatase: 173 U/L — ABNORMAL HIGH (ref 38–126)
BILIRUBIN TOTAL: 0.5 mg/dL (ref 0.3–1.2)
BUN: 42 mg/dL — ABNORMAL HIGH (ref 8–23)
CO2: 27 mmol/L (ref 22–32)
Calcium: 8.6 mg/dL — ABNORMAL LOW (ref 8.9–10.3)
Chloride: 92 mmol/L — ABNORMAL LOW (ref 98–111)
Creatinine, Ser: 4.05 mg/dL — ABNORMAL HIGH (ref 0.44–1.00)
GFR calc Af Amer: 10 mL/min — ABNORMAL LOW (ref >60)
GFR calc non Af Amer: 8 mL/min — ABNORMAL LOW (ref >60)
GLUCOSE: 123 mg/dL — AB (ref 70–99)
POTASSIUM: 5.1 mmol/L (ref 3.5–5.1)
SODIUM: 130 mmol/L — AB (ref 135–145)
TOTAL PROTEIN: 7.3 g/dL (ref 6.5–8.1)

## 2018-01-07 LAB — URINALYSIS, COMPLETE (UACMP) WITH MICROSCOPIC
BACTERIA UA: NONE SEEN
BILIRUBIN URINE: NEGATIVE
GLUCOSE, UA: NEGATIVE mg/dL
Hgb urine dipstick: NEGATIVE
KETONES UR: NEGATIVE mg/dL
LEUKOCYTES UA: NEGATIVE
NITRITE: NEGATIVE
Protein, ur: 100 mg/dL — AB
Specific Gravity, Urine: 1.013 (ref 1.005–1.030)
pH: 8 (ref 5.0–8.0)

## 2018-01-07 LAB — CBC WITH DIFFERENTIAL/PLATELET
Abs Immature Granulocytes: 0.04 10*3/uL (ref 0.00–0.07)
BASOS ABS: 0 10*3/uL (ref 0.0–0.1)
BASOS PCT: 0 %
EOS ABS: 0.1 10*3/uL (ref 0.0–0.5)
Eosinophils Relative: 1 %
HEMATOCRIT: 26.3 % — AB (ref 36.0–46.0)
Hemoglobin: 8.1 g/dL — ABNORMAL LOW (ref 12.0–15.0)
Immature Granulocytes: 0 %
Lymphocytes Relative: 16 %
Lymphs Abs: 1.8 10*3/uL (ref 0.7–4.0)
MCH: 28.6 pg (ref 26.0–34.0)
MCHC: 30.8 g/dL (ref 30.0–36.0)
MCV: 92.9 fL (ref 80.0–100.0)
Monocytes Absolute: 0.7 10*3/uL (ref 0.1–1.0)
Monocytes Relative: 6 %
NRBC: 0 % (ref 0.0–0.2)
Neutro Abs: 9 10*3/uL — ABNORMAL HIGH (ref 1.7–7.7)
Neutrophils Relative %: 77 %
PLATELETS: 513 10*3/uL — AB (ref 150–400)
RBC: 2.83 MIL/uL — AB (ref 3.87–5.11)
RDW: 13.3 % (ref 11.5–15.5)
WBC: 11.6 10*3/uL — AB (ref 4.0–10.5)

## 2018-01-07 LAB — TROPONIN I: Troponin I: <0.03 ng/mL (ref <0.03)

## 2018-01-07 LAB — LIPASE, BLOOD: Lipase: 34 U/L (ref 11–51)

## 2018-01-07 MED ORDER — ONDANSETRON HCL 4 MG/2ML IJ SOLN
INTRAMUSCULAR | Status: AC
  Administered 2018-01-08: 4 mg via INTRAVENOUS
  Filled 2018-01-07: qty 2

## 2018-01-07 MED ORDER — SODIUM CHLORIDE 0.9 % IV SOLN
Freq: Once | INTRAVENOUS | Status: AC
  Administered 2018-01-07: 22:00:00 via INTRAVENOUS

## 2018-01-07 MED ORDER — ONDANSETRON HCL 4 MG/2ML IJ SOLN
4.0000 mg | Freq: Once | INTRAMUSCULAR | Status: AC
  Administered 2018-01-07: 4 mg via INTRAVENOUS
  Filled 2018-01-07: qty 2

## 2018-01-07 NOTE — ED Provider Notes (Signed)
[facility] Emergency Department Provider Note       Time seen: ----------------------------------------- 9:42 PM on 01/07/2018 -----------------------------------------   I have reviewed the triage vital signs and the nursing notes.  HISTORY   Chief Complaint Emesis Level V caveat: History/ROS limited by altered mental status   HPI Cindy Harvey is a 84 y.o. female with a history of hyperlipidemia, hypertension, pneumonia, urinary incontinence who presents to the ED for poor p.o. intake for the past several weeks with associated vomiting after eating.  Family also reports she has had some flank pain.  He also reports she has a history of renal failure.  Patient is very hard of hearing with a history of dementia, cannot give further review of systems or report.  Past Medical History:  Diagnosis Date  . Allergy    allergic rhinitis  . Hearing loss   . History of shingles   . Hypercholesterolemia   . Hypertension   . Macular degeneration   . Osteoarthritis    right knee  . Pneumonia 2009   with dehydration and electrolyte imbalance  . Urinary incontinence     Patient Active Problem List   Diagnosis Date Noted  . Epistaxis 04/18/2017  . Hospital discharge follow-up 03/06/2016  . CVA (cerebral vascular accident) ([place]) 02/25/2016  . Encounter for end of life care 08/23/2015  . Senile dementia with psychosis ([place]) 01/01/2014  . Generalized anxiety disorder 12/04/2013  . History of fall 09/28/2013  . DNR (do not resuscitate) discussion 09/26/2013  . Hyponatremia 09/26/2013  . End stage kidney disease ([place]) 01/31/2013  . Heart murmur 07/21/2011  . Post-menopausal 07/14/2010  . Osteoporosis screening 07/14/2010  . Hordeolum 07/07/2010  . Depression (emotion) 05/21/2010  . HLD (hyperlipidemia) 10/24/2008  . Essential hypertension 10/24/2008  . ALLERGIC RHINITIS 10/24/2008  . Osteoarthrosis involving lower leg 10/24/2008    Past Surgical  History:  Procedure Laterality Date  . TONSILLECTOMY  1928    Allergies Asa buff (mag [buffered aspirin]; Lipitor [atorvastatin calcium]; and Zocor [simvastatin - high dose]  Social History Social History   Tobacco Use  . Smoking status: Never Smoker  . Smokeless tobacco: Never Used  Substance Use Topics  . Alcohol use: No  . Drug use: No   Review of Systems Constitutional: Negative for fever. Gastrointestinal: Positive for abdominal pain, vomiting Musculoskeletal: Positive for flank pain Skin: Positive for poor skin turgor according to daughter Neurological: Positive for weakness  All systems negative/normal/unremarkable except as stated in the HPI  ____________________________________________   PHYSICAL EXAM:  VITAL SIGNS: ED Triage Vitals  Enc Vitals Group     BP 01/07/18 2137 (!) 163/64     Pulse Rate 01/07/18 2137 76     Resp 01/07/18 2137 18     Temp 01/07/18 2137 (!) 96.5 F (35.8 C)     Temp Source 01/07/18 2137 Axillary     SpO2 01/07/18 2137 97 %     Weight 01/07/18 2134 103 lb (46.7 kg)     Height 01/07/18 2134 5\' 1"  (1.549 m)     Head Circumference --      Peak Flow --      Pain Score --      Pain Loc --      Pain Edu? --      Excl. in [place]? --    Constitutional: Alert but disoriented.  Mild distress Eyes: Conjunctivae are normal. Normal extraocular movements. ENT   Head: Normocephalic and atraumatic.   Nose: No  congestion/rhinnorhea.   Mouth/Throat: Mucous membranes are moist.   Neck: No stridor. Cardiovascular: Normal rate, regular rhythm. No murmurs, rubs, or gallops. Respiratory: Normal respiratory effort without tachypnea nor retractions. Breath sounds are clear and equal bilaterally. No wheezes/rales/rhonchi. Gastrointestinal: Soft and nontender. Normal bowel sounds Musculoskeletal: Nontender with normal range of motion in extremities. No lower extremity tenderness nor edema. Neurologic:  No gross focal neurologic deficits are  appreciated.  Skin:  Skin is warm, dry and intact. No rash noted. Psychiatric: Mood and affect are normal. Speech and behavior are normal.  ____________________________________________  ED COURSE:  As part of my medical decision making, I reviewed the following data within the [place] History obtained from family if available, nursing notes, old chart and ekg, as well as notes from prior ED visits. Patient presented for vomiting, flank pain and weakness, we will assess with labs and imaging as indicated at this time.   Procedures ____________________________________________   LABS (pertinent positives/negatives)  Labs Reviewed  CBC WITH DIFFERENTIAL/PLATELET - Abnormal; Notable for the following components:      Result Value   WBC 11.6 (*)    RBC 2.83 (*)    Hemoglobin 8.1 (*)    HCT 26.3 (*)    Platelets 513 (*)    Neutro Abs 9.0 (*)    All other components within normal limits  COMPREHENSIVE METABOLIC PANEL - Abnormal; Notable for the following components:   Sodium 130 (*)    Chloride 92 (*)    Glucose, Bld 123 (*)    BUN 42 (*)    Creatinine, Ser 4.05 (*)    Calcium 8.6 (*)    Alkaline Phosphatase 173 (*)    GFR calc non Af Amer 8 (*)    GFR calc Af Amer 10 (*)    All other components within normal limits  URINALYSIS, COMPLETE (UACMP) WITH MICROSCOPIC - Abnormal; Notable for the following components:   Color, Urine STRAW (*)    APPearance CLEAR (*)    Protein, ur 100 (*)    All other components within normal limits  LIPASE, BLOOD  TROPONIN I    RADIOLOGY Images were viewed by me  CT renal protocol IMPRESSION: 1. No obstructive uropathy or other acute abnormality of the abdomen or pelvis. 2. Mildly distended gallbladder. If there is clinical concern for acute cholecystitis, right upper quadrant ultrasound is recommended. 3. Age indeterminate fracture of the right radius with mild dorsal displacement at the wrist, probably  chronic.  Aortic Atherosclerosis (ICD10-I70.0). ____________________________________________  DIFFERENTIAL DIAGNOSIS   Renal colic, UTI, pyelonephritis, renal failure, dehydration, electrolyte abnormality  FINAL ASSESSMENT AND PLAN  Vomiting, acute on chronic renal failure   Plan: The patient had presented for poor p.o. intake with vomiting recently. Patient's labs did reveal worsening in her chronic kidney disease likely reflecting dehydration.  She was given IV fluid for same.  Patient's imaging did not reveal any acute process.  I will discuss with the hospitalist for admission.   Laurence Aly, MD   Note: This note was generated in part or whole with voice recognition software. Voice recognition is usually quite accurate but there are transcription errors that can and very often do occur. I apologize for any typographical errors that were not detected and corrected.     Earleen Newport, MD 01/07/18 [phone]

## 2018-01-07 NOTE — ED Triage Notes (Signed)
Per EMS report, patient has had poor PO intake over several weeks per staff report and vomited today after eating.

## 2018-01-08 ENCOUNTER — Observation Stay

## 2018-01-08 ENCOUNTER — Other Ambulatory Visit: Payer: Self-pay

## 2018-01-08 DIAGNOSIS — E860 Dehydration: Secondary | ICD-10-CM | POA: Diagnosis not present

## 2018-01-08 DIAGNOSIS — R112 Nausea with vomiting, unspecified: Secondary | ICD-10-CM | POA: Diagnosis not present

## 2018-01-08 DIAGNOSIS — I10 Essential (primary) hypertension: Secondary | ICD-10-CM | POA: Diagnosis not present

## 2018-01-08 DIAGNOSIS — N186 End stage renal disease: Secondary | ICD-10-CM | POA: Diagnosis not present

## 2018-01-08 LAB — BASIC METABOLIC PANEL
Anion gap: 11 (ref 5–15)
BUN: 37 mg/dL — AB (ref 8–23)
CHLORIDE: 99 mmol/L (ref 98–111)
CO2: 22 mmol/L (ref 22–32)
CREATININE: 3.7 mg/dL — AB (ref 0.44–1.00)
Calcium: 8.2 mg/dL — ABNORMAL LOW (ref 8.9–10.3)
GFR calc Af Amer: 11 mL/min — ABNORMAL LOW (ref >60)
GFR, EST NON AFRICAN AMERICAN: 9 mL/min — AB (ref >60)
GLUCOSE: 94 mg/dL (ref 70–99)
POTASSIUM: 4.8 mmol/L (ref 3.5–5.1)
Sodium: 132 mmol/L — ABNORMAL LOW (ref 135–145)

## 2018-01-08 LAB — MRSA PCR SCREENING: MRSA by PCR: POSITIVE — AB

## 2018-01-08 LAB — CBC
HEMATOCRIT: 24.9 % — AB (ref 36.0–46.0)
Hemoglobin: 7.8 g/dL — ABNORMAL LOW (ref 12.0–15.0)
MCH: 29 pg (ref 26.0–34.0)
MCHC: 31.3 g/dL (ref 30.0–36.0)
MCV: 92.6 fL (ref 80.0–100.0)
Platelets: 457 10*3/uL — ABNORMAL HIGH (ref 150–400)
RBC: 2.69 MIL/uL — ABNORMAL LOW (ref 3.87–5.11)
RDW: 13.3 % (ref 11.5–15.5)
WBC: 11.5 10*3/uL — AB (ref 4.0–10.5)
nRBC: 0 % (ref 0.0–0.2)

## 2018-01-08 MED ORDER — ONDANSETRON HCL 4 MG PO TABS: 4.0000 mg | ORAL_TABLET | Freq: Four times a day (QID) | ORAL | Status: DC | PRN

## 2018-01-08 MED ORDER — MORPHINE SULFATE (PF) 2 MG/ML IV SOLN
1.0000 mg | INTRAVENOUS | Status: DC | PRN
  Administered 2018-01-08: 05:00:00 1 mg via INTRAVENOUS
  Filled 2018-01-08 (×2): qty 1

## 2018-01-08 MED ORDER — SODIUM CHLORIDE 0.9 % IV BOLUS
500.0000 mL | Freq: Once | INTRAVENOUS | Status: AC
  Administered 2018-01-08: 500 mL via INTRAVENOUS

## 2018-01-08 MED ORDER — MORPHINE SULFATE (PF) 2 MG/ML IV SOLN
1.0000 mg | Freq: Once | INTRAVENOUS | Status: AC
  Administered 2018-01-08: 1 mg via INTRAVENOUS
  Filled 2018-01-08: qty 1

## 2018-01-08 MED ORDER — PROCHLORPERAZINE EDISYLATE 10 MG/2ML IJ SOLN
5.0000 mg | INTRAMUSCULAR | Status: DC | PRN
  Filled 2018-01-08: qty 1

## 2018-01-08 MED ORDER — ONDANSETRON HCL 4 MG/2ML IJ SOLN
4.0000 mg | Freq: Four times a day (QID) | INTRAMUSCULAR | Status: DC | PRN
  Administered 2018-01-08: 4 mg via INTRAVENOUS
  Filled 2018-01-08: qty 2

## 2018-01-08 MED ORDER — LORAZEPAM 0.5 MG PO TABS: 0.5000 mg | ORAL_TABLET | ORAL | Status: DC | PRN

## 2018-01-08 MED ORDER — AMLODIPINE BESYLATE 5 MG PO TABS
5.0000 mg | ORAL_TABLET | Freq: Every day | ORAL | Status: DC
  Administered 2018-01-09: 11:00:00 5 mg via ORAL
  Filled 2018-01-08: qty 1

## 2018-01-08 MED ORDER — ACETAMINOPHEN 325 MG PO TABS: 650.0000 mg | ORAL_TABLET | Freq: Four times a day (QID) | ORAL | Status: DC | PRN

## 2018-01-08 MED ORDER — QUETIAPINE FUMARATE 25 MG PO TABS
12.5000 mg | ORAL_TABLET | Freq: Every day | ORAL | Status: DC
  Administered 2018-01-08: 21:00:00 12.5 mg via ORAL
  Filled 2018-01-08: qty 1

## 2018-01-08 MED ORDER — HEPARIN SODIUM (PORCINE) 5000 UNIT/ML IJ SOLN
5000.0000 [IU] | Freq: Three times a day (TID) | INTRAMUSCULAR | Status: DC
  Administered 2018-01-08 – 2018-01-09 (×4): 5000 [IU] via SUBCUTANEOUS
  Filled 2018-01-08 (×3): qty 1

## 2018-01-08 MED ORDER — ONDANSETRON HCL 4 MG/2ML IJ SOLN
4.0000 mg | Freq: Once | INTRAMUSCULAR | Status: AC
  Administered 2018-01-08: 4 mg via INTRAVENOUS

## 2018-01-08 MED ORDER — ACETAMINOPHEN 650 MG RE SUPP: 650.0000 mg | Freq: Four times a day (QID) | RECTAL | Status: DC | PRN

## 2018-01-08 MED ORDER — SODIUM CHLORIDE 0.9 % IV SOLN
INTRAVENOUS | Status: DC
  Administered 2018-01-08: 02:00:00 via INTRAVENOUS

## 2018-01-08 NOTE — H&P (Signed)
[facility] [place] NAME: Cindy Harvey    MR#:  [id number]  DATE OF BIRTH:  1916-09-30  DATE OF ADMISSION:  01/07/2018  PRIMARY CARE PHYSICIAN: Crecencio Mc, MD   REQUESTING/REFERRING PHYSICIAN: Jimmye Norman, MD  CHIEF COMPLAINT:   Chief Complaint  Patient presents with  . Emesis    HISTORY OF PRESENT ILLNESS:  Omari Koslosky Yielding  is a 84 y.o. female who presents with chief complaint as above.  Patient brought to the ED by family due to significant vomiting today.  This started early in the morning and progressed throughout the whole day.  Here in the ED tonight she is noted to be dehydrated.  She is unable to contribute much information to her HPI due to dementia.  She was given some IV fluids in the ED, but it was felt that she needs more throughout the night.  She also still has some persistent nausea with mild vomiting.  Hospitalist were called for admission  PAST MEDICAL HISTORY:   Past Medical History:  Diagnosis Date  . Allergy    allergic rhinitis  . Hearing loss   . History of shingles   . Hypercholesterolemia   . Hypertension   . Macular degeneration   . Osteoarthritis    right knee  . Pneumonia 2009   with dehydration and electrolyte imbalance  . Urinary incontinence      PAST SURGICAL HISTORY:   Past Surgical History:  Procedure Laterality Date  . TONSILLECTOMY  1928     SOCIAL HISTORY:   Social History   Tobacco Use  . Smoking status: Never Smoker  . Smokeless tobacco: Never Used  Substance Use Topics  . Alcohol use: No     FAMILY HISTORY:   Family History  Problem Relation Age of Onset  . Stroke Sister   . Heart disease Mother   . Stroke Mother   . Stroke Sister   . Cancer Maternal Aunt        breast CA  . Cancer Cousin        breast cancer  . Stroke Sister      DRUG ALLERGIES:   Allergies  Allergen Reactions  . Asa Buff (Mag [Buffered Aspirin] Other (See Comments)    Nose  bleeds  . Lipitor [Atorvastatin Calcium] Other (See Comments)    Headaches & dizzy  . Seroquel [Quetiapine Fumarate]   . Zocor [Simvastatin - High Dose] Other (See Comments)    myalgia    MEDICATIONS AT HOME:   Prior to Admission medications   Medication Sig Start Date End Date Taking? Authorizing Provider  acetaminophen (TYLENOL CHILDRENS) 160 MG/5ML suspension Take 20 mLs (640 mg total) by mouth every 6 (six) hours as needed. 11/14/17  Yes Crecencio Mc, MD  alum & mag hydroxide-simeth (MAALOX/MYLANTA) 200-200-20 MG/5ML suspension Take 15 mLs by mouth daily.   Yes [provider]  amLODipine (NORVASC) 5 MG tablet TAKE 1 TABLET (5 MG TOTAL) BY MOUTH DAILY. 01/27/15  Yes Darlin Coco, MD  AZO-CRANBERRY PO Take 2 tablets by mouth daily.   Yes [provider]  furosemide (LASIX) 20 MG tablet Take 20 mg by mouth daily.   Yes [provider]  LORazepam (ATIVAN) 0.5 MG tablet Take 1 tablet (0.5 mg total) by mouth every 4 (four) hours as needed for anxiety (/agitation/restlessness). 09/06/17  Yes Crecencio Mc, MD  meloxicam (MOBIC) 7.5 MG tablet TAKE 1 TABLET (7.5 MG TOTAL) BY  MOUTH DAILY. 11/14/15  Yes Crecencio Mc, MD  oxyCODONE-acetaminophen (PERCOCET) 5-325 MG tablet 1/2 tablet by mouth every 4 hours as needed for pain 11/16/17  Yes Crecencio Mc, MD  promethazine (PHENERGAN) 12.5 MG tablet Take 1 tablet (12.5 mg total) by mouth every 8 (eight) hours as needed for nausea or vomiting. 03/03/16  Yes Crecencio Mc, MD  QUEtiapine (SEROQUEL) 25 MG tablet Take 1 tablet (25 mg total) by mouth at bedtime. Patient taking differently: Take 12.5 mg by mouth at bedtime.  09/10/16  Yes Crecencio Mc, MD  acetaminophen (TYLENOL) 160 MG/5ML liquid Take 10.2 mLs (325 mg total) by mouth daily as needed for fever (knee pain or headache). 03/03/16   Crecencio Mc, MD  furosemide (LASIX) 20 MG tablet Take 10 mg by mouth 2 (two) times daily as needed.    [provider]  guaiFENesin (MUCINEX CHEST CONGESTION CHILD) 100 MG/5ML liquid Take 10 mLs (200 mg total) by mouth 3 (three) times daily as needed for cough or congestion. 03/03/16   Crecencio Mc, MD  ipratropium-albuterol (DUONEB) 0.5-2.5 (3) MG/3ML SOLN Take 3 mLs by nebulization every 6 (six) hours as needed.    [provider]  loperamide (IMODIUM A-D) 2 MG tablet Take 2 mg by mouth 3 (three) times daily as needed for diarrhea or loose stools.    [provider]  Multiple Vitamins-Minerals (EQ MULTIVITAMINS ADULT GUMMY) CHEW Chew 1 tablet by mouth 2 (two) times daily before lunch and supper. 03/03/16   Crecencio Mc, MD    REVIEW OF SYSTEMS:  Review of Systems  Unable to perform ROS: Dementia     VITAL SIGNS:   Vitals:   01/07/18 2134 01/07/18 2137 01/07/18 2313  BP:  (!) 163/64 136/76  Pulse:  76 89  Resp:  18 14  Temp:  (!) 96.5 F (35.8 C)   TempSrc:  Axillary   SpO2:  97% 95%  Weight: 46.7 kg    Height: 5\' 1"  (1.549 m)     Wt Readings from Last 3 Encounters:  01/07/18 46.7 kg  05/18/17 48.3 kg  04/29/17 45.8 kg    PHYSICAL EXAMINATION:  Physical Exam  Vitals reviewed. Constitutional: She is oriented to person, place, and time. She appears well-developed and well-nourished. No distress.  HENT:  Head: Normocephalic and atraumatic.  Dry mucous membranes  Eyes: Pupils are equal, round, and reactive to light. Conjunctivae and EOM are normal. No scleral icterus.  Neck: Normal range of motion. Neck supple. No JVD present. No thyromegaly present.  Cardiovascular: Normal rate, regular rhythm and intact distal pulses. Exam reveals no gallop and no friction rub.  No murmur heard. Respiratory: Effort normal and breath sounds normal. No respiratory distress. She has no wheezes. She has no rales.  GI: Soft. Bowel sounds are normal. She exhibits no distension. There is no tenderness.  Musculoskeletal: Normal range of motion. She exhibits no edema.  No  arthritis, no gout  Lymphadenopathy:    She has no cervical adenopathy.  Neurological: She is alert and oriented to person, place, and time. No cranial nerve deficit.  No dysarthria, no aphasia  Skin: Skin is warm and dry. No rash noted. No erythema.  Psychiatric: She has a normal mood and affect. Her behavior is normal. Judgment and thought content normal.    LABORATORY PANEL:   CBC Recent Labs  Lab 01/07/18 2131  WBC 11.6*  HGB 8.1*  HCT 26.3*  PLT 513*   ------------------------------------------------------------------------------------------------------------------  Chemistries  Recent Labs  Lab 01/07/18 2131  NA 130*  K 5.1  CL 92*  CO2 27  GLUCOSE 123*  BUN 42*  CREATININE 4.05*  CALCIUM 8.6*  AST 16  ALT 10  ALKPHOS 173*  BILITOT 0.5   ------------------------------------------------------------------------------------------------------------------  Cardiac Enzymes Recent Labs  Lab 01/07/18 2131  TROPONINI <0.03   ------------------------------------------------------------------------------------------------------------------  RADIOLOGY:  Ct Renal Stone Study  Result Date: 01/07/2018 CLINICAL DATA:  Flank pain and vomiting EXAM: CT ABDOMEN AND PELVIS WITHOUT CONTRAST TECHNIQUE: Multidetector CT imaging of the abdomen and pelvis was performed following the standard protocol without IV contrast. COMPARISON:  None. FINDINGS: LOWER CHEST: There is no basilar pleural or apical pericardial effusion. HEPATOBILIARY: The hepatic contours and density are normal. There is no intra- or extrahepatic biliary dilatation. Mildly distended gallbladder. PANCREAS: The pancreatic parenchymal contours are normal and there is no ductal dilatation. There is no peripancreatic fluid collection. SPLEEN: Normal. ADRENALS/URINARY TRACT: --Adrenal glands: Normal. --Right kidney/ureter: Atrophy without hydronephrosis or nephrolithiasis. --Left kidney/ureter: Atrophy without  hydronephrosis or nephrolithiasis. Exophytic renal cysts measure up to 2.3 cm. --Urinary bladder: Normal for degree of distention STOMACH/BOWEL: --Stomach/Duodenum: Small sliding hiatal hernia. --Small bowel: No dilatation or inflammation. --Colon: No focal abnormality. --Appendix: Normal. VASCULAR/LYMPHATIC: Atherosclerotic calcification is present within the non-aneurysmal abdominal aorta, without hemodynamically significant stenosis. No abdominal or pelvic lymphadenopathy. REPRODUCTIVE: Calcifications within the otherwise normal uterus. No adnexal mass. MUSCULOSKELETAL. Multilevel degenerative disc disease and facet arthrosis. No bony spinal canal stenosis. There is an incidentally noted fracture of the right radius with mild dorsal displacement at the wrist. This is age indeterminate, probably chronic. OTHER: None. IMPRESSION: 1. No obstructive uropathy or other acute abnormality of the abdomen or pelvis. 2. Mildly distended gallbladder. If there is clinical concern for acute cholecystitis, right upper quadrant ultrasound is recommended. 3. Age indeterminate fracture of the right radius with mild dorsal displacement at the wrist, probably chronic. Aortic Atherosclerosis (ICD10-I70.0). Electronically Signed   By: Ulyses Jarred M.D.   On: 01/07/2018 22:58    EKG:   Orders placed or performed during the hospital encounter of 10/30/16  . ED EKG  . ED EKG  . EKG 12-Lead  . EKG 12-Lead    IMPRESSION AND PLAN:  Principal Problem:   Dehydration -IV fluids throughout the night tonight for hydration Active Problems:   End stage kidney disease ([place]) -she is creatinine has bumped somewhat from her baseline which is about 3.5, though her GFR is about the same.  Avoid nephrotoxins, cautious administration of IV fluids as above   Nausea & vomiting -PRN antiemetics, IV hydration as above   Essential hypertension -home dose antihypertensives   HLD (hyperlipidemia) -Home dose antilipid  Chart review performed  and case discussed with ED provider. Labs, imaging and/or ECG reviewed by provider and discussed with patient/family. Management plans discussed with the patient and/or family.  DVT PROPHYLAXIS: SubQ heparin  GI PROPHYLAXIS:  PPI   ADMISSION STATUS: Observation  CODE STATUS: DNR Code Status History    Date Active Date Inactive Code Status Order ID Comments User Context   02/24/2016 0200 02/26/2016 1952 DNR [id number]  Lance Coon, MD Inpatient    Questions for Most Recent Historical Code Status (Order [id number])    Question Answer Comment   In the event of cardiac or respiratory ARREST Do not call a "code blue"    In the event of cardiac or respiratory ARREST Do not perform Intubation, CPR, defibrillation or ACLS    In the event  of cardiac or respiratory ARREST Use medication by any route, position, wound care, and other measures to relive pain and suffering. [place] use oxygen, suction and manual treatment of airway obstruction as needed for comfort.         Advance Directive Documentation     Most Recent Value  Type of Advance Directive  Out of facility DNR (pink MOST or yellow form)  Pre-existing out of facility DNR order (yellow form or pink MOST form)  Yellow form placed in chart (order not valid for inpatient use)  "MOST" Form in Place?  -      TOTAL TIME TAKING CARE OF THIS PATIENT: 40 minutes.   Tyden Kann [place] 01/08/2018, 12:15 AM  [facility]  Office  [phone]  CC: Primary care physician; Crecencio Mc, MD  Note:  This document was prepared using Dragon voice recognition software and [place] include unintentional dictation errors.

## 2018-01-08 NOTE — Progress Notes (Addendum)
This nurse was in pts room this assessing pt with daughter in the room. As I was unhooking IV tubing from pt arm, daughter was trying to talk to the pt. The pt was looking at me while I was working with her arm. Pt daughter grabbed pt arm and started shaking it and yelling "Look at me! I'm talking to you!" I informed daughter that she was likely not hearing her because she was focused on what I was doing to her. Pt is extremely HOH, and has macular degeneration and does not see very well. Pt daughter was also speaking for pt and not allowing her to answer for herself. Pt c/o generalized pain, but daughter told pt she did not have any pain because she couldn't pinpoint a location.

## 2018-01-09 DIAGNOSIS — E860 Dehydration: Secondary | ICD-10-CM | POA: Diagnosis not present

## 2018-01-09 DIAGNOSIS — E785 Hyperlipidemia, unspecified: Secondary | ICD-10-CM | POA: Diagnosis present

## 2018-01-09 DIAGNOSIS — M1711 Unilateral primary osteoarthritis, right knee: Secondary | ICD-10-CM | POA: Diagnosis present

## 2018-01-09 DIAGNOSIS — F0390 Unspecified dementia without behavioral disturbance: Secondary | ICD-10-CM | POA: Diagnosis present

## 2018-01-09 DIAGNOSIS — R1110 Vomiting, unspecified: Secondary | ICD-10-CM | POA: Diagnosis present

## 2018-01-09 DIAGNOSIS — R112 Nausea with vomiting, unspecified: Secondary | ICD-10-CM | POA: Diagnosis not present

## 2018-01-09 DIAGNOSIS — I120 Hypertensive chronic kidney disease with stage 5 chronic kidney disease or end stage renal disease: Secondary | ICD-10-CM | POA: Diagnosis present

## 2018-01-09 DIAGNOSIS — H3530 Unspecified macular degeneration: Secondary | ICD-10-CM | POA: Diagnosis present

## 2018-01-09 DIAGNOSIS — Z66 Do not resuscitate: Secondary | ICD-10-CM | POA: Diagnosis present

## 2018-01-09 DIAGNOSIS — Z886 Allergy status to analgesic agent status: Secondary | ICD-10-CM | POA: Diagnosis not present

## 2018-01-09 DIAGNOSIS — K828 Other specified diseases of gallbladder: Secondary | ICD-10-CM | POA: Diagnosis present

## 2018-01-09 DIAGNOSIS — K529 Noninfective gastroenteritis and colitis, unspecified: Secondary | ICD-10-CM | POA: Diagnosis present

## 2018-01-09 DIAGNOSIS — Z8673 Personal history of transient ischemic attack (TIA), and cerebral infarction without residual deficits: Secondary | ICD-10-CM | POA: Diagnosis not present

## 2018-01-09 DIAGNOSIS — I10 Essential (primary) hypertension: Secondary | ICD-10-CM | POA: Diagnosis not present

## 2018-01-09 DIAGNOSIS — H9190 Unspecified hearing loss, unspecified ear: Secondary | ICD-10-CM | POA: Diagnosis present

## 2018-01-09 DIAGNOSIS — Z888 Allergy status to other drugs, medicaments and biological substances status: Secondary | ICD-10-CM | POA: Diagnosis not present

## 2018-01-09 DIAGNOSIS — N186 End stage renal disease: Secondary | ICD-10-CM | POA: Diagnosis not present

## 2018-01-09 DIAGNOSIS — Z79899 Other long term (current) drug therapy: Secondary | ICD-10-CM | POA: Diagnosis not present

## 2018-01-09 MED ORDER — CHLORHEXIDINE GLUCONATE CLOTH 2 % EX PADS: 6.0000 | MEDICATED_PAD | Freq: Every day | CUTANEOUS | Status: DC

## 2018-01-09 MED ORDER — MUPIROCIN 2 % EX OINT
1.0000 "application " | TOPICAL_OINTMENT | Freq: Two times a day (BID) | CUTANEOUS | Status: DC
  Filled 2018-01-09: qty 22
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
## 2018-01-09 NOTE — Progress Notes (Addendum)
Pt and Daughter present to review discharge instructions. Pt transitioning care home to [facility]. No questions at time time of d/c. Pt daughter acknowledges and states packet is to be provided to [employer] on arrival. EMTALA, DNR and MOST forms included with d/c instructions

## 2018-01-09 NOTE — Progress Notes (Signed)
Visit made. Patient is currently followed by [facility] with a hospice diagnosis of Hypertensive chronic kidney disease. She is a DNR code, with out of facility DNR in place in at the tacitly. Facility did not send the DNR with this patient. Patient was sent to the [facility] 11/16  for evaluation of intractable vomiting. She received IV zofran x 2 doses in the ED and vomiting has not reoccurred.  No acute process noted in the abdominal CT. Patient has been up sitting on the side of the bed and eating meals. Patient seen lying in bed, alert, daughter Thornton Dales at bedside. Patient very hard of hearing, but answers questions appropriately.  Plan is for discharge back to [facility] today, Rosalynn plans to transport her mother by car. She reports patient still able to ambulate with a walker and she does not expect to have any difficulty getting her into the car. Staff RN Velna Hatchet and [place] aware. PT consult pending at daughters request. Updated notes faxed to triage, Hospice team notified of discharge. Flo Shanks RN, BSN, [place], hospital Liaison [phone]

## 2018-01-09 NOTE — Discharge Summary (Signed)
Cindy at [facility] Harvey, 84 y.o., DOB 11-17-1916, MRN [id number]. Admission date: 01/07/2018 Discharge Date 01/09/2018 Primary MD Crecencio Mc, MD Admitting Physician Lance Coon, MD  Admission Diagnosis  Acute kidney injury [facility]) [N17.9] Non-intractable vomiting with nausea, unspecified vomiting type [R11.2]  Discharge Diagnosis   Principal Problem: Nausea vomiting due to gastroenteritis  dehydration  HLD (hyperlipidemia)  essential hypertension  End stage kidney disease [facility])       Hospital Course  Patient is 84 year old female presented with nausea vomiting.  Patient has been's having the symptoms for few weeks now.  According to daughter this started after patient was started on Zoloft.  Patient's Zoloft was discontinued and her nausea vomiting stopped and then 1 week later she had a recurrent episode and continued to have emesis therefore she was brought to the emergency room.  In the emergency room CT scan per renal protocol was abnormal with a distended gallbladder.  She underwent ultrasound of her upper abdomen which showed no evidence of gallbladder disease.  Patient's nausea vomiting is now resolved.            Consults  None  Significant Tests:  See full reports for all details     Ct Renal Stone Study  Result Date: 01/07/2018 CLINICAL DATA:  Flank pain and vomiting EXAM: CT ABDOMEN AND PELVIS WITHOUT CONTRAST TECHNIQUE: Multidetector CT imaging of the abdomen and pelvis was performed following the standard protocol without IV contrast. COMPARISON:  None. FINDINGS: LOWER CHEST: There is no basilar pleural or apical pericardial effusion. HEPATOBILIARY: The hepatic contours and density are normal. There is no intra- or extrahepatic biliary dilatation. Mildly distended gallbladder. PANCREAS: The pancreatic parenchymal contours are normal and there is no ductal dilatation. There is no peripancreatic fluid collection.  SPLEEN: Normal. ADRENALS/URINARY TRACT: --Adrenal glands: Normal. --Right kidney/ureter: Atrophy without hydronephrosis or nephrolithiasis. --Left kidney/ureter: Atrophy without hydronephrosis or nephrolithiasis. Exophytic renal cysts measure up to 2.3 cm. --Urinary bladder: Normal for degree of distention STOMACH/BOWEL: --Stomach/Duodenum: Small sliding hiatal hernia. --Small bowel: No dilatation or inflammation. --Colon: No focal abnormality. --Appendix: Normal. VASCULAR/LYMPHATIC: Atherosclerotic calcification is present within the non-aneurysmal abdominal aorta, without hemodynamically significant stenosis. No abdominal or pelvic lymphadenopathy. REPRODUCTIVE: Calcifications within the otherwise normal uterus. No adnexal mass. MUSCULOSKELETAL. Multilevel degenerative disc disease and facet arthrosis. No bony spinal canal stenosis. There is an incidentally noted fracture of the right radius with mild dorsal displacement at the wrist. This is age indeterminate, probably chronic. OTHER: None. IMPRESSION: 1. No obstructive uropathy or other acute abnormality of the abdomen or pelvis. 2. Mildly distended gallbladder. If there is clinical concern for acute cholecystitis, right upper quadrant ultrasound is recommended. 3. Age indeterminate fracture of the right radius with mild dorsal displacement at the wrist, probably chronic. Aortic Atherosclerosis (ICD10-I70.0). Electronically Signed   By: Ulyses Jarred M.D.   On: 01/07/2018 22:58   US Abdomen Limited Ruq  Result Date: 01/08/2018 CLINICAL DATA:  Abdominal pain, cholecystitis EXAM: ULTRASOUND ABDOMEN LIMITED RIGHT UPPER QUADRANT COMPARISON:  CT abdomen/pelvis dated 01/07/2018 FINDINGS: Gallbladder: No gallstones, gallbladder wall thickening, or pericholecystic fluid. Common bile duct: Diameter: 6 mm Liver: No focal lesion identified. Within normal limits in parenchymal echogenicity. Portal vein is patent on color Doppler imaging with normal direction of blood  flow towards the liver. IMPRESSION: Negative right upper quadrant ultrasound. Electronically Signed   By: Julian Hy M.D.   On: 01/08/2018 08:19       Today  Subjective:   Cindy Harvey patient doing better  Objective:   Blood pressure (!) 158/77, pulse 82, temperature 98 F (36.7 C), temperature source Oral, resp. rate 20, height 5\' 1"  (1.549 m), weight 48.7 kg, SpO2 97 %.  .  Intake/Output Summary (Last 24 hours) at 01/09/2018 1022 Last data filed at 01/08/2018 1248 Gross per 24 hour  Intake 120 ml  Output -  Net 120 ml    Exam VITAL SIGNS: Blood pressure (!) 158/77, pulse 82, temperature 98 F (36.7 C), temperature source Oral, resp. rate 20, height 5\' 1"  (1.549 m), weight 48.7 kg, SpO2 97 %.  GENERAL:  84 y.o.-year-old patient lying in the bed with no acute distress.  EYES: Pupils equal, round, reactive to light and accommodation. No scleral icterus. Extraocular muscles intact.  HEENT: Head atraumatic, normocephalic. Oropharynx and nasopharynx clear.  NECK:  Supple, no jugular venous distention. No thyroid enlargement, no tenderness.  LUNGS: Normal breath sounds bilaterally, no wheezing, rales,rhonchi or crepitation. No use of accessory muscles of respiration.  CARDIOVASCULAR: S1, S2 normal. No murmurs, rubs, or gallops.  ABDOMEN: Soft, nontender, nondistended. Bowel sounds present. No organomegaly or mass.  EXTREMITIES: No pedal edema, cyanosis, or clubbing.  NEUROLOGIC: Cranial nerves II through XII are intact. Muscle strength 5/5 in all extremities. Sensation intact. Gait not checked.  PSYCHIATRIC: The patient is alert and oriented x 3.  SKIN: No obvious rash, lesion, or ulcer.   Data Review     CBC w Diff:  Lab Results  Component Value Date   WBC 11.5 (H) 01/08/2018   HGB 7.8 (L) 01/08/2018   HCT 24.9 (L) 01/08/2018   PLT 457 (H) 01/08/2018   LYMPHOPCT 16 01/07/2018   MONOPCT 6 01/07/2018   EOSPCT 1 01/07/2018   BASOPCT 0 01/07/2018   CMP:  Lab  Results  Component Value Date   NA 132 (L) 01/08/2018   K 4.8 01/08/2018   CL 99 01/08/2018   CO2 22 01/08/2018   BUN 37 (H) 01/08/2018   CREATININE 3.70 (H) 01/08/2018   CREATININE 3.65 (H) 05/05/2017   PROT 7.3 01/07/2018   ALBUMIN 3.9 01/07/2018   BILITOT 0.5 01/07/2018   ALKPHOS 173 (H) 01/07/2018   AST 16 01/07/2018   ALT 10 01/07/2018  .  Micro Results Recent Results (from the past 240 hour(s))  MRSA PCR Screening     Status: Abnormal   Collection Time: 01/08/18  2:06 AM  Result Value Ref Range Status   MRSA by PCR POSITIVE (A) NEGATIVE Final    Comment:        The GeneXpert MRSA Assay (FDA approved for NASAL specimens only), is one component of a comprehensive MRSA colonization surveillance program. It is not intended to diagnose MRSA infection nor to guide or monitor treatment for MRSA infections. RESULT CALLED TO, READ BACK BY AND VERIFIED WITH: YAKANA CROSS AT 0422 ON 01/08/18 BY SNJ Performed at [facility], [address] [place], [place] [address]         Code Status Orders  (From admission, onward)         Start     Ordered   01/08/18 0109  Do not attempt resuscitation (DNR)  Continuous    Question Answer Comment  In the event of cardiac or respiratory ARREST Do not call a "code blue"   In the event of cardiac or respiratory ARREST Do not perform Intubation, CPR, defibrillation or ACLS   In the event of cardiac or respiratory ARREST  Use medication by any route, position, wound care, and other measures to relive pain and suffering. Schwegler use oxygen, suction and manual treatment of airway obstruction as needed for comfort.      01/08/18 0109        Code Status History    Date Active Date Inactive Code Status Order ID Comments User Context   02/24/2016 0200 02/26/2016 1952 DNR [id number]  Lance Coon, MD Inpatient    Advance Directive Documentation     Most Recent Value  Type of Advance Directive  Out of facility DNR (pink MOST or  yellow form)  Pre-existing out of facility DNR order (yellow form or pink MOST form)  Pink MOST form placed in chart (order not valid for inpatient use)  "MOST" Form in Place?  -          Follow-up Information    Crecencio Mc, MD Follow up in 6 day(s).   Specialty:  Internal Medicine Contact information: [address] [place] [address] [phone]        Efrain Sella, MD Follow up in 1 week(s).   Specialty:  Gastroenterology Why:  for recurrent nausea and vomting Contact information: [place] [address] [phone]           Discharge Medications   Allergies as of 01/09/2018      Reactions   Asa Buff (mag [buffered Aspirin] Other (See Comments)   Nose bleeds   Lipitor [atorvastatin Calcium] Other (See Comments)   Headaches & dizzy   Seroquel [quetiapine Fumarate]    Zocor [simvastatin - High Dose] Other (See Comments)   myalgia      Medication List    STOP taking these medications   oxyCODONE-acetaminophen 5-325 MG tablet Commonly known as:  PERCOCET/ROXICET   sertraline 25 MG tablet Commonly known as:  ZOLOFT     TAKE these medications   acetaminophen 160 MG/5ML liquid Commonly known as:  TYLENOL Take 10.2 mLs (325 mg total) by mouth daily as needed for fever (knee pain or headache). What changed:    Another medication with the same name was changed. Make sure you understand how and when to take each.  Another medication with the same name was removed. Continue taking this medication, and follow the directions you see here.   acetaminophen 160 MG/5ML suspension Commonly known as:  TYLENOL Take 20 mLs (640 mg total) by mouth every 6 (six) hours as needed. What changed:    when to take this  Another medication with the same name was removed. Continue taking this medication, and follow the directions you see here.   alum & mag hydroxide-simeth 200-200-20 MG/5ML suspension Commonly known as:   MAALOX/MYLANTA Take 15 mLs by mouth daily.   alum & mag hydroxide-simeth 200-200-20 MG/5ML suspension Commonly known as:  MAALOX/MYLANTA Take 15 mLs by mouth 2 (two) times daily as needed for indigestion or heartburn.   amLODipine 5 MG tablet Commonly known as:  NORVASC TAKE 1 TABLET (5 MG TOTAL) BY MOUTH DAILY. What changed:  See the new instructions.   AZO-CRANBERRY PO Take 2 tablets by mouth daily.   EQ MULTIVITAMINS ADULT GUMMY Chew Chew 1 tablet by mouth 2 (two) times daily before lunch and supper.   furosemide 20 MG tablet Commonly known as:  LASIX Take 10 mg by mouth 2 (two) times daily as needed for edema. What changed:  Another medication with the same name was removed. Continue taking this medication, and follow  the directions you see here.   guaiFENesin 100 MG/5ML liquid Commonly known as:  ROBITUSSIN Take 10 mLs (200 mg total) by mouth 3 (three) times daily as needed for cough or congestion.   ipratropium-albuterol 0.5-2.5 (3) MG/3ML Soln Commonly known as:  DUONEB Take 3 mLs by nebulization every 6 (six) hours as needed (cough).   loperamide 2 MG tablet Commonly known as:  IMODIUM A-D Take 2 mg by mouth as needed for diarrhea or loose stools.   LORazepam 0.5 MG tablet Commonly known as:  ATIVAN Take 1 tablet (0.5 mg total) by mouth every 4 (four) hours as needed for anxiety (/agitation/restlessness).   meloxicam 7.5 MG tablet Commonly known as:  MOBIC TAKE 1 TABLET (7.5 MG TOTAL) BY MOUTH DAILY.   promethazine 12.5 MG tablet Commonly known as:  PHENERGAN Take 1 tablet (12.5 mg total) by mouth every 8 (eight) hours as needed for nausea or vomiting.   QUEtiapine 25 MG tablet Commonly known as:  SEROQUEL Take 1 tablet (25 mg total) by mouth at bedtime. What changed:    how much to take  when to take this  additional instructions   senna-docusate 8.6-50 MG tablet Commonly known as:  Senokot-S Take 1 tablet by mouth every 3 (three) days.           Total Time in preparing paper work, data evaluation and todays exam - 5 minutes  Dustin Flock M.D on 01/09/2018 at 10:22 AM [place]  [phone]

## 2018-01-09 NOTE — Plan of Care (Signed)
Pt transitioning care to [facility] with transportation being provided via daughter. Per pt family request awaiting physical therapy visit to be completed before transition care from facility.
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
## 2018-01-09 NOTE — NC FL2 (Signed)
[place] LEVEL OF CARE SCREENING TOOL     IDENTIFICATION  Patient Name: Cindy Harvey Birthdate: [birth year] Sex: female Admission Date (Current Location): 01/07/2018  [place] and [place] Number:  Engineering geologist and Address:  [facility], [address] [place], [place] [address]      Provider Number: [id number]  Attending Physician Name and Address:  Dustin Flock, MD  Relative Name and Phone Number:       Current Level of Care: Hospital Recommended Level of Care: [place] Prior Approval Number:    Date Approved/Denied:   PASRR Number:    Discharge Plan: Domiciliary (Rest home)    Current Diagnoses: Patient Active Problem List   Diagnosis Date Noted  . Nausea & vomiting 01/08/2018  . Dehydration 01/08/2018  . Epistaxis 04/18/2017  . Hospital discharge follow-up 03/06/2016  . CVA (cerebral vascular accident) ([place]) 02/25/2016  . Encounter for end of life care 08/23/2015  . Senile dementia with psychosis ([place]) 01/01/2014  . Generalized anxiety disorder 12/04/2013  . History of fall 09/28/2013  . DNR (do not resuscitate) discussion 09/26/2013  . Hyponatremia 09/26/2013  . End stage kidney disease ([place]) 01/31/2013  . Heart murmur 07/21/2011  . Post-menopausal 07/14/2010  . Osteoporosis screening 07/14/2010  . Hordeolum 07/07/2010  . Depression (emotion) 05/21/2010  . HLD (hyperlipidemia) 10/24/2008  . Essential hypertension 10/24/2008  . ALLERGIC RHINITIS 10/24/2008  . Osteoarthrosis involving lower leg 10/24/2008    Orientation RESPIRATION BLADDER Height & Weight     Self, Place  Normal Continent Weight: 107 lb 6.4 oz (48.7 kg) Height:  5\' 1"  (154.9 cm)  BEHAVIORAL SYMPTOMS/MOOD NEUROLOGICAL BOWEL NUTRITION STATUS  (none) (none) Incontinent Diet(Heart Healthy )  AMBULATORY STATUS COMMUNICATION OF NEEDS Skin   Limited Assist Verbally Normal                       Personal Care  Assistance Level of Assistance  Bathing, Feeding, Dressing Bathing Assistance: Limited assistance Feeding assistance: Limited assistance Dressing Assistance: Limited assistance     Functional Limitations Info  Hearing, Sight, Speech Sight Info: Adequate Hearing Info: Adequate Speech Info: Adequate    SPECIAL CARE FACTORS FREQUENCY                       Contractures Contractures Info: Not present    Additional Factors Info  Code Status, Allergies Code Status Info: DNR Allergies Info:  Asa Buff (Mag Buffered Aspirin, Lipitor Atorvastatin Calcium, Seroquel Quetiapine Fumarate, Zocor Simvastatin - High Dose           Current Medications (01/09/2018):  This is the current hospital active medication list Current Facility-Administered Medications  Medication Dose Route Frequency Provider Last Rate Last Dose  . acetaminophen (TYLENOL) tablet 650 mg  650 mg Oral Q6H PRN Lance Coon, MD       Or  . acetaminophen (TYLENOL) suppository 650 mg  650 mg Rectal Q6H PRN Lance Coon, MD      . amLODipine (NORVASC) tablet 5 mg  5 mg Oral Daily Lance Coon, MD   5 mg at 01/09/18 1052  . Chlorhexidine Gluconate Cloth 2 % PADS 6 each  6 each Topical Q0600 Dustin Flock, MD      . heparin injection 5,000 Units  5,000 Units Subcutaneous Camelia Phenes Lance Coon, MD   5,000 Units at 01/09/18 0544  . LORazepam (ATIVAN) tablet 0.5 mg  0.5 mg Oral Q4H PRN Lance Coon, MD      .  morphine 2 MG/ML injection 1 mg  1 mg Intravenous Q2H PRN Harrie Foreman, MD   1 mg at 01/08/18 0513  . mupirocin ointment (BACTROBAN) 2 % 1 application  1 application Nasal BID Dustin Flock, MD      . ondansetron [facility]) tablet 4 mg  4 mg Oral Q6H PRN Lance Coon, MD       Or  . ondansetron [facility]) injection 4 mg  4 mg Intravenous Q6H PRN Lance Coon, MD   4 mg at 01/08/18 0208  . prochlorperazine (COMPAZINE) injection 5 mg  5 mg Intravenous Q4H PRN Lance Coon, MD      . QUEtiapine (SEROQUEL) tablet  12.5 mg  12.5 mg Oral Corwin Levins, MD   12.5 mg at 01/08/18 2124     Discharge Medications: Please see discharge summary for a list of discharge medications.  Relevant Imaging Results:  Relevant Lab Results:   Additional Information    Mackenzi Krogh  Louretta Shorten, LCSWA

## 2018-01-09 NOTE — Clinical Social Work Note (Signed)
Patient is medically ready for discharge today to go back to [facility] with hospice services. Daughter will transport patient back to facility.   [place], [place]

## 2018-01-11 ENCOUNTER — Telehealth: Payer: Self-pay | Admitting: Internal Medicine

## 2018-01-11 ENCOUNTER — Other Ambulatory Visit: Payer: Self-pay

## 2018-01-11 NOTE — Telephone Encounter (Signed)
Spoke with Marzetta Board, RN with Hospice, patients caseworker. Marzetta Board wants to know if the pt can stay on the meloxicam because that seems to be the only thing that controls the pt's pain and she can tolerate. Marzetta Board also wanted to know if the pt has to be on the Norvasc? Other medications Marzetta Board is wanting a verbal to continue is liquid Tylenol BID, Lasix 20mg  BID PRN, Ativan .5mg  PRN, Seroquel 12.5mg , and zofran 4mg  PRN.

## 2018-01-11 NOTE — Telephone Encounter (Signed)
Dc meds reviewed.  Need to D/c meloxicam .  Please give verbal.  I have written the same order on the faxed transmission from [facility] in red folder

## 2018-01-11 NOTE — Telephone Encounter (Signed)
Yes she can continue meloxicam given those facts.  Verbal authorization ok

## 2018-01-12 NOTE — Telephone Encounter (Signed)
Verbal ok given to Erline Levine, RN with Hospice.

## 2018-01-21 ENCOUNTER — Encounter: Payer: Self-pay | Admitting: *Deleted

## 2018-01-21 ENCOUNTER — Other Ambulatory Visit: Payer: Self-pay

## 2018-01-21 ENCOUNTER — Inpatient Hospital Stay
Admission: EM | Admit: 2018-01-21 | Discharge: 2018-01-25 | DRG: 377 | Disposition: A | Discharge status: Hospice | Attending: Internal Medicine | Admitting: Internal Medicine

## 2018-01-21 DIAGNOSIS — R0902 Hypoxemia: Secondary | ICD-10-CM | POA: Diagnosis present

## 2018-01-21 DIAGNOSIS — Z8701 Personal history of pneumonia (recurrent): Secondary | ICD-10-CM

## 2018-01-21 DIAGNOSIS — E875 Hyperkalemia: Secondary | ICD-10-CM | POA: Diagnosis not present

## 2018-01-21 DIAGNOSIS — K222 Esophageal obstruction: Secondary | ICD-10-CM | POA: Diagnosis present

## 2018-01-21 DIAGNOSIS — Z803 Family history of malignant neoplasm of breast: Secondary | ICD-10-CM

## 2018-01-21 DIAGNOSIS — E785 Hyperlipidemia, unspecified: Secondary | ICD-10-CM | POA: Diagnosis not present

## 2018-01-21 DIAGNOSIS — M2550 Pain in unspecified joint: Secondary | ICD-10-CM | POA: Diagnosis not present

## 2018-01-21 DIAGNOSIS — F0390 Unspecified dementia without behavioral disturbance: Secondary | ICD-10-CM | POA: Diagnosis present

## 2018-01-21 DIAGNOSIS — K264 Chronic or unspecified duodenal ulcer with hemorrhage: Secondary | ICD-10-CM | POA: Diagnosis not present

## 2018-01-21 DIAGNOSIS — Z66 Do not resuscitate: Secondary | ICD-10-CM | POA: Diagnosis present

## 2018-01-21 DIAGNOSIS — D500 Iron deficiency anemia secondary to blood loss (chronic): Secondary | ICD-10-CM | POA: Diagnosis present

## 2018-01-21 DIAGNOSIS — N179 Acute kidney failure, unspecified: Secondary | ICD-10-CM | POA: Diagnosis not present

## 2018-01-21 DIAGNOSIS — Z8639 Personal history of other endocrine, nutritional and metabolic disease: Secondary | ICD-10-CM | POA: Diagnosis not present

## 2018-01-21 DIAGNOSIS — E871 Hypo-osmolality and hyponatremia: Secondary | ICD-10-CM | POA: Diagnosis present

## 2018-01-21 DIAGNOSIS — R100 Acute abdomen: Secondary | ICD-10-CM | POA: Diagnosis not present

## 2018-01-21 DIAGNOSIS — N184 Chronic kidney disease, stage 4 (severe): Secondary | ICD-10-CM | POA: Diagnosis present

## 2018-01-21 DIAGNOSIS — D649 Anemia, unspecified: Secondary | ICD-10-CM | POA: Diagnosis not present

## 2018-01-21 DIAGNOSIS — Z515 Encounter for palliative care: Secondary | ICD-10-CM | POA: Diagnosis not present

## 2018-01-21 DIAGNOSIS — K922 Gastrointestinal hemorrhage, unspecified: Secondary | ICD-10-CM | POA: Diagnosis not present

## 2018-01-21 DIAGNOSIS — R4020 Unspecified coma: Secondary | ICD-10-CM | POA: Diagnosis not present

## 2018-01-21 DIAGNOSIS — H9190 Unspecified hearing loss, unspecified ear: Secondary | ICD-10-CM | POA: Diagnosis present

## 2018-01-21 DIAGNOSIS — Z8619 Personal history of other infectious and parasitic diseases: Secondary | ICD-10-CM

## 2018-01-21 DIAGNOSIS — H3530 Unspecified macular degeneration: Secondary | ICD-10-CM | POA: Diagnosis present

## 2018-01-21 DIAGNOSIS — Z8249 Family history of ischemic heart disease and other diseases of the circulatory system: Secondary | ICD-10-CM

## 2018-01-21 DIAGNOSIS — E860 Dehydration: Secondary | ICD-10-CM | POA: Diagnosis present

## 2018-01-21 DIAGNOSIS — F418 Other specified anxiety disorders: Secondary | ICD-10-CM | POA: Diagnosis not present

## 2018-01-21 DIAGNOSIS — I129 Hypertensive chronic kidney disease with stage 1 through stage 4 chronic kidney disease, or unspecified chronic kidney disease: Secondary | ICD-10-CM | POA: Diagnosis present

## 2018-01-21 DIAGNOSIS — Z7401 Bed confinement status: Secondary | ICD-10-CM | POA: Diagnosis not present

## 2018-01-21 DIAGNOSIS — E7800 Pure hypercholesterolemia, unspecified: Secondary | ICD-10-CM | POA: Diagnosis not present

## 2018-01-21 DIAGNOSIS — R578 Other shock: Secondary | ICD-10-CM

## 2018-01-21 DIAGNOSIS — D62 Acute posthemorrhagic anemia: Secondary | ICD-10-CM | POA: Diagnosis not present

## 2018-01-21 DIAGNOSIS — R092 Respiratory arrest: Secondary | ICD-10-CM | POA: Diagnosis present

## 2018-01-21 DIAGNOSIS — R404 Transient alteration of awareness: Secondary | ICD-10-CM | POA: Diagnosis not present

## 2018-01-21 DIAGNOSIS — Z823 Family history of stroke: Secondary | ICD-10-CM

## 2018-01-21 DIAGNOSIS — J690 Pneumonitis due to inhalation of food and vomit: Secondary | ICD-10-CM

## 2018-01-21 DIAGNOSIS — K920 Hematemesis: Secondary | ICD-10-CM | POA: Diagnosis not present

## 2018-01-21 DIAGNOSIS — N186 End stage renal disease: Secondary | ICD-10-CM | POA: Diagnosis not present

## 2018-01-21 DIAGNOSIS — R52 Pain, unspecified: Secondary | ICD-10-CM | POA: Diagnosis not present

## 2018-01-21 DIAGNOSIS — I120 Hypertensive chronic kidney disease with stage 5 chronic kidney disease or end stage renal disease: Secondary | ICD-10-CM | POA: Diagnosis not present

## 2018-01-21 LAB — BASIC METABOLIC PANEL
Anion gap: 7 (ref 5–15)
BUN: 57 mg/dL — ABNORMAL HIGH (ref 8–23)
CHLORIDE: 106 mmol/L (ref 98–111)
CO2: 22 mmol/L (ref 22–32)
Calcium: 7.7 mg/dL — ABNORMAL LOW (ref 8.9–10.3)
Creatinine, Ser: 3.9 mg/dL — ABNORMAL HIGH (ref 0.44–1.00)
GFR calc Af Amer: 10 mL/min — ABNORMAL LOW (ref >60)
GFR calc non Af Amer: 9 mL/min — ABNORMAL LOW (ref >60)
Glucose, Bld: 98 mg/dL (ref 70–99)
POTASSIUM: 5.4 mmol/L — AB (ref 3.5–5.1)
Sodium: 135 mmol/L (ref 135–145)

## 2018-01-21 LAB — CBC WITH DIFFERENTIAL/PLATELET
Abs Immature Granulocytes: 0.1 10*3/uL — ABNORMAL HIGH (ref 0.00–0.07)
Basophils Absolute: 0 10*3/uL (ref 0.0–0.1)
Basophils Relative: 0 %
EOS PCT: 2 %
Eosinophils Absolute: 0.3 10*3/uL (ref 0.0–0.5)
HCT: 20.5 % — ABNORMAL LOW (ref 36.0–46.0)
Hemoglobin: 6.2 g/dL — ABNORMAL LOW (ref 12.0–15.0)
Immature Granulocytes: 1 %
Lymphocytes Relative: 14 %
Lymphs Abs: 1.7 10*3/uL (ref 0.7–4.0)
MCH: 29 pg (ref 26.0–34.0)
MCHC: 30.2 g/dL (ref 30.0–36.0)
MCV: 95.8 fL (ref 80.0–100.0)
Monocytes Absolute: 0.8 10*3/uL (ref 0.1–1.0)
Monocytes Relative: 7 %
Neutro Abs: 9.5 10*3/uL — ABNORMAL HIGH (ref 1.7–7.7)
Neutrophils Relative %: 76 %
Platelets: 470 10*3/uL — ABNORMAL HIGH (ref 150–400)
RBC: 2.14 MIL/uL — ABNORMAL LOW (ref 3.87–5.11)
RDW: 13.3 % (ref 11.5–15.5)
WBC: 12.4 10*3/uL — ABNORMAL HIGH (ref 4.0–10.5)
nRBC: 0 % (ref 0.0–0.2)

## 2018-01-21 LAB — COMPREHENSIVE METABOLIC PANEL
ALK PHOS: 148 U/L — AB (ref 38–126)
ALT: 12 U/L (ref 0–44)
AST: 20 U/L (ref 15–41)
Albumin: 3 g/dL — ABNORMAL LOW (ref 3.5–5.0)
Anion gap: 11 (ref 5–15)
BUN: 50 mg/dL — ABNORMAL HIGH (ref 8–23)
CALCIUM: 8.2 mg/dL — AB (ref 8.9–10.3)
CO2: 22 mmol/L (ref 22–32)
Chloride: 100 mmol/L (ref 98–111)
Creatinine, Ser: 4.26 mg/dL — ABNORMAL HIGH (ref 0.44–1.00)
GFR calc Af Amer: 9 mL/min — ABNORMAL LOW (ref >60)
GFR calc non Af Amer: 8 mL/min — ABNORMAL LOW (ref >60)
Glucose, Bld: 113 mg/dL — ABNORMAL HIGH (ref 70–99)
Potassium: 5.3 mmol/L — ABNORMAL HIGH (ref 3.5–5.1)
Sodium: 133 mmol/L — ABNORMAL LOW (ref 135–145)
Total Bilirubin: 0.4 mg/dL (ref 0.3–1.2)
Total Protein: 6 g/dL — ABNORMAL LOW (ref 6.5–8.1)

## 2018-01-21 LAB — HEMOGLOBIN AND HEMATOCRIT, BLOOD
HCT: 29.2 % — ABNORMAL LOW (ref 36.0–46.0)
HCT: 28.2 % — ABNORMAL LOW (ref 36.0–46.0)
Hemoglobin: 9.3 g/dL — ABNORMAL LOW (ref 12.0–15.0)
Hemoglobin: 9.3 g/dL — ABNORMAL LOW (ref 12.0–15.0)

## 2018-01-21 LAB — GLUCOSE, CAPILLARY: Glucose-Capillary: 96 mg/dL (ref 70–99)

## 2018-01-21 LAB — MAGNESIUM: Magnesium: 2.8 mg/dL — ABNORMAL HIGH (ref 1.7–2.4)

## 2018-01-21 LAB — ABO/RH: ABO/RH(D): A NEG

## 2018-01-21 LAB — PREPARE RBC (CROSSMATCH)

## 2018-01-21 LAB — PHOSPHORUS: PHOSPHORUS: 4.7 mg/dL — AB (ref 2.5–4.6)

## 2018-01-21 LAB — TSH: TSH: 3.115 u[IU]/mL (ref 0.350–4.500)

## 2018-01-21 LAB — PROTIME-INR
INR: 1.06
Prothrombin Time: 13.7 seconds (ref 11.4–15.2)

## 2018-01-21 MED ORDER — DOCUSATE SODIUM 100 MG PO CAPS
100.0000 mg | ORAL_CAPSULE | Freq: Two times a day (BID) | ORAL | Status: DC
  Filled 2018-01-21 (×2): qty 1

## 2018-01-21 MED ORDER — PANTOPRAZOLE SODIUM 40 MG IV SOLR
40.0000 mg | Freq: Two times a day (BID) | INTRAVENOUS | Status: DC
  Administered 2018-01-21: 40 mg via INTRAVENOUS

## 2018-01-21 MED ORDER — SODIUM CHLORIDE 0.9 % IV SOLN
8.0000 mg/h | INTRAVENOUS | Status: DC
  Administered 2018-01-21 – 2018-01-22 (×4): 8 mg/h via INTRAVENOUS
  Filled 2018-01-21 (×6): qty 80

## 2018-01-21 MED ORDER — SODIUM CHLORIDE 0.9 % IV SOLN
80.0000 mg | Freq: Once | INTRAVENOUS | Status: AC
  Administered 2018-01-21: 80 mg via INTRAVENOUS
  Filled 2018-01-21: qty 80

## 2018-01-21 MED ORDER — PANTOPRAZOLE SODIUM 40 MG IV SOLR
INTRAVENOUS | Status: AC
  Filled 2018-01-21: qty 40

## 2018-01-21 MED ORDER — ONDANSETRON HCL 4 MG/2ML IJ SOLN: 4.0000 mg | Freq: Four times a day (QID) | INTRAMUSCULAR | Status: DC | PRN

## 2018-01-21 MED ORDER — SODIUM CHLORIDE 0.9 % IV SOLN
INTRAVENOUS | Status: DC
  Administered 2018-01-21: 100 mL/h via INTRAVENOUS
  Administered 2018-01-21 – 2018-01-24 (×4): via INTRAVENOUS

## 2018-01-21 MED ORDER — ONDANSETRON HCL 4 MG PO TABS: 4.0000 mg | ORAL_TABLET | Freq: Four times a day (QID) | ORAL | Status: DC | PRN

## 2018-01-21 MED ORDER — SODIUM CHLORIDE 0.9 % IV SOLN
10.0000 mL/h | Freq: Once | INTRAVENOUS | Status: AC
  Administered 2018-01-23: 14:00:00 via INTRAVENOUS

## 2018-01-21 NOTE — ED Notes (Signed)
Dr Diamond at bedside. 

## 2018-01-21 NOTE — ED Triage Notes (Addendum)
Pt was found to be vomiting blood(coffee ground) around 0515 and also having bloody stool per [facility].

## 2018-01-21 NOTE — H&P (Signed)
Cindy Harvey is an 84 y.o. female.    Chief Complaint: Hematemesis HPI: The patient with past medical history of hypertension and hearing loss presents to the emergency department from her nursing home with hematemesis.  The patient's daughter states that her mother was in her usual state of health approximately 10 PM but by 5 AM reportedly had multiple episodes of coffee-ground emesis.  The patient denies pain.  Hemoglobin was found to have dropped by more than 1 g.  The patient also became hypotensive with systolic pressures between 85 and 90.  The patient received IV pantoprazole and 2 units of packed red blood cells were ordered for transfusion prior to the emergency department staff calling the hospitalist service for admission.  Past Medical History:  Diagnosis Date  . Allergy    allergic rhinitis  . Hearing loss   . History of shingles   . Hypercholesterolemia   . Hypertension   . Macular degeneration   . Osteoarthritis    right knee  . Pneumonia 2009   with dehydration and electrolyte imbalance  . Urinary incontinence     Past Surgical History:  Procedure Laterality Date  . TONSILLECTOMY  1928    Family History  Problem Relation Age of Onset  . Stroke Sister   . Heart disease Mother   . Stroke Mother   . Stroke Sister   . Cancer Maternal Aunt        breast CA  . Cancer Cousin        breast cancer  . Stroke Sister    Social History:  reports that she has never smoked. She has never used smokeless tobacco. She reports that she does not drink alcohol or use drugs.  Allergies:  Allergies  Allergen Reactions  . Asa Buff (Mag [Buffered Aspirin] Other (See Comments)    Nose bleeds  . Lipitor [Atorvastatin Calcium] Other (See Comments)    Headaches & dizzy  . Seroquel [Quetiapine Fumarate]   . Zocor [Simvastatin - High Dose] Other (See Comments)    myalgia     (Not in a hospital admission)  Results for orders placed or performed during the hospital encounter of  01/21/18 (from the past 48 hour(s))  Comprehensive metabolic panel     Status: Abnormal   Collection Time: 01/21/18  6:06 AM  Result Value Ref Range   Sodium 133 (L) 135 - 145 mmol/L   Potassium 5.3 (H) 3.5 - 5.1 mmol/L   Chloride 100 98 - 111 mmol/L   CO2 22 22 - 32 mmol/L   Glucose, Bld 113 (H) 70 - 99 mg/dL   BUN 50 (H) 8 - 23 mg/dL   Creatinine, Ser 4.26 (H) 0.44 - 1.00 mg/dL   Calcium 8.2 (L) 8.9 - 10.3 mg/dL   Total Protein 6.0 (L) 6.5 - 8.1 g/dL   Albumin 3.0 (L) 3.5 - 5.0 g/dL   AST 20 15 - 41 U/L   ALT 12 0 - 44 U/L   Alkaline Phosphatase 148 (H) 38 - 126 U/L   Total Bilirubin 0.4 0.3 - 1.2 mg/dL   GFR calc non Af Amer 8 (L) >60 mL/min   GFR calc Af Amer 9 (L) >60 mL/min   Anion gap 11 5 - 15    Comment: Performed at [facility], [address] [place], [place] [address]  CBC WITH DIFFERENTIAL     Status: Abnormal   Collection Time: 01/21/18  6:06 AM  Result Value Ref Range   WBC  12.4 (H) 4.0 - 10.5 K/uL   RBC 2.14 (L) 3.87 - 5.11 MIL/uL   Hemoglobin 6.2 (L) 12.0 - 15.0 g/dL   HCT 20.5 (L) 36.0 - 46.0 %   MCV 95.8 80.0 - 100.0 fL   MCH 29.0 26.0 - 34.0 pg   MCHC 30.2 30.0 - 36.0 g/dL   RDW 13.3 11.5 - 15.5 %   Platelets 470 (H) 150 - 400 K/uL   nRBC 0.0 0.0 - 0.2 %   Neutrophils Relative % 76 %   Neutro Abs 9.5 (H) 1.7 - 7.7 K/uL   Lymphocytes Relative 14 %   Lymphs Abs 1.7 0.7 - 4.0 K/uL   Monocytes Relative 7 %   Monocytes Absolute 0.8 0.1 - 1.0 K/uL   Eosinophils Relative 2 %   Eosinophils Absolute 0.3 0.0 - 0.5 K/uL   Basophils Relative 0 %   Basophils Absolute 0.0 0.0 - 0.1 K/uL   Immature Granulocytes 1 %   Abs Immature Granulocytes 0.10 (H) 0.00 - 0.07 K/uL    Comment: Performed at [facility], [address] [place], [place] [address]  Protime-INR     Status: None   Collection Time: 01/21/18  6:06 AM  Result Value Ref Range   Prothrombin Time 13.7 11.4 - 15.2 seconds   INR 1.06     Comment: Performed at [facility], [address], [place], [place] [address]  ABO/Rh     Status: None   Collection Time: 01/21/18  6:06 AM  Result Value Ref Range   ABO/RH(D)      A NEG Performed at [facility], [address] [place], [place] [address]   Type and screen Ordered by PROVIDER DEFAULT     Status: None (Preliminary result)   Collection Time: 01/21/18  6:24 AM  Result Value Ref Range   ABO/RH(D) A NEG    Antibody Screen NEG    Sample Expiration 01/24/2018    Unit Number [id number]    Blood Component Type RED CELLS,LR    Unit division 00    Status of Unit ISSUED    Unit tag comment EMERGENCY RELEASE    Transfusion Status OK TO TRANSFUSE    Crossmatch Result COMPATIBLE    Unit Number [id number]    Blood Component Type RED CELLS,LR    Unit division 00    Status of Unit ISSUED    Unit tag comment EMERGENCY RELEASE    Transfusion Status OK TO TRANSFUSE    Crossmatch Result COMPATIBLE   Prepare RBC     Status: None   Collection Time: 01/21/18  6:42 AM  Result Value Ref Range   Order Confirmation      ORDER PROCESSED BY BLOOD BANK Performed at [facility], [address] [place],  [address]    No results found.  Review of Systems  Constitutional: Negative for chills and fever.  HENT: Negative for sore throat and tinnitus.   Eyes: Negative for blurred vision and redness.  Respiratory: Negative for cough and shortness of breath.   Cardiovascular: Negative for chest pain, palpitations, orthopnea and PND.  Gastrointestinal: Positive for vomiting. Negative for abdominal pain, diarrhea and nausea.  Genitourinary: Negative for dysuria, frequency and urgency.  Musculoskeletal: Negative for joint pain and myalgias.  Skin: Negative for rash.       No lesions  Neurological: Negative for speech change, focal weakness and weakness.  Endo/Heme/Allergies: Does not bruise/bleed easily.       No temperature  intolerance  Psychiatric/Behavioral: Negative for depression  and suicidal ideas.    Blood pressure 133/61, pulse 71, temperature (!) 97.5 F (36.4 C), temperature source Oral, resp. rate 18, weight 45.4 kg, SpO2 98 %. Physical Exam  Vitals reviewed. Constitutional: She is oriented to person, place, and time. She appears well-developed and well-nourished. No distress.  HENT:  Head: Normocephalic and atraumatic.  Mouth/Throat: Oropharynx is clear and moist.  Eyes: Pupils are equal, round, and reactive to light. Conjunctivae and EOM are normal. No scleral icterus.  Neck: Normal range of motion. Neck supple. No JVD present. No tracheal deviation present. No thyromegaly present.  Cardiovascular: Normal rate, regular rhythm and normal heart sounds. Exam reveals no gallop and no friction rub.  No murmur heard. Respiratory: Effort normal and breath sounds normal.  GI: Soft. Bowel sounds are normal. She exhibits no distension. There is no tenderness.  Genitourinary:  Genitourinary Comments: Deferred  Musculoskeletal: Normal range of motion.  Lymphadenopathy:    She has no cervical adenopathy.  Neurological: She is alert and oriented to person, place, and time. No cranial nerve deficit. She exhibits normal muscle tone.  Skin: Skin is warm and dry. No rash noted. No erythema.  Psychiatric: She has a normal mood and affect. Her behavior is normal. Judgment and thought content normal.     Assessment/Plan This is a 84 year old female admitted for hematemesis. 1.  Hematemesis: Continue IV pantoprazole.  Blood transfusion underway.  The patient is n.p.o.  Consult gastroenterology.   2.  Hemorrhagic shock: Secondary to upper GI bleed.  Responding well to fluid and blood transfusion.  Admit to ICU 3.  Acute kidney injury: Secondary to GI bleed.  Hydrate with intravenous fluid and expand blood volume 4.  Hyperkalemia: Mild; secondary to all of the above.  No arrhythmias at this time.  Avoid insulin shift at this time.  Continue to monitor. 5.  DVT prophylaxis:  SCDs [place] 6.  GI prophylaxis: As above The patient is a DNR.  I have personally spent 45 minutes in critical care time with this patient  Harrie Foreman, MD 01/21/2018, 9:23 AM

## 2018-01-21 NOTE — ED Provider Notes (Signed)
[facility] Emergency Department Provider Note   First MD Initiated Contact with Patient 01/21/18 [phone]     (approximate)  I have reviewed the triage vital signs and the nursing notes.  Level 5 caveat history and review of system  limited secondary to dementia. HISTORY  Chief Complaint GI Problem and Hematemesis    HPI Cindy Harvey is a 84 y.o. female presents to the emergency department from [facility] with acute onset of coffee-ground emesis and "melena" which began at approximately 5:00 this morning.  Per the patient's daughter at bedside who is an Therapist, sports on her arrival to the facility she noted coffee-ground emesis and distinct melena.   Past Medical History:  Diagnosis Date  . Allergy    allergic rhinitis  . Hearing loss   . History of shingles   . Hypercholesterolemia   . Hypertension   . Macular degeneration   . Osteoarthritis    right knee  . Pneumonia 2009   with dehydration and electrolyte imbalance  . Urinary incontinence     Patient Active Problem List   Diagnosis Date Noted  . Nausea & vomiting 01/08/2018  . Dehydration 01/08/2018  . Epistaxis 04/18/2017  . Hospital discharge follow-up 03/06/2016  . CVA (cerebral vascular accident) ([place]) 02/25/2016  . Encounter for end of life care 08/23/2015  . Senile dementia with psychosis ([place]) 01/01/2014  . Generalized anxiety disorder 12/04/2013  . History of fall 09/28/2013  . DNR (do not resuscitate) discussion 09/26/2013  . Hyponatremia 09/26/2013  . End stage kidney disease ([place]) 01/31/2013  . Heart murmur 07/21/2011  . Post-menopausal 07/14/2010  . Osteoporosis screening 07/14/2010  . Hordeolum 07/07/2010  . Depression (emotion) 05/21/2010  . HLD (hyperlipidemia) 10/24/2008  . Essential hypertension 10/24/2008  . ALLERGIC RHINITIS 10/24/2008  . Osteoarthrosis involving lower leg 10/24/2008    Past Surgical History:  Procedure Laterality Date  . TONSILLECTOMY  1928     Prior to Admission medications   Medication Sig Start Date End Date Taking? Authorizing Provider  acetaminophen (TYLENOL CHILDRENS) 160 MG/5ML suspension Take 20 mLs (640 mg total) by mouth every 6 (six) hours as needed. Patient taking differently: Take 640 mg by mouth 3 (three) times daily.  11/14/17   Crecencio Mc, MD  acetaminophen (TYLENOL) 160 MG/5ML liquid Take 10.2 mLs (325 mg total) by mouth daily as needed for fever (knee pain or headache). 03/03/16   Crecencio Mc, MD  alum & mag hydroxide-simeth (MAALOX/MYLANTA) 200-200-20 MG/5ML suspension Take 15 mLs by mouth daily.    [provider]  alum & mag hydroxide-simeth (MAALOX/MYLANTA) 200-200-20 MG/5ML suspension Take 15 mLs by mouth 2 (two) times daily as needed for indigestion or heartburn.    [provider]  amLODipine (NORVASC) 5 MG tablet TAKE 1 TABLET (5 MG TOTAL) BY MOUTH DAILY. Patient taking differently: Take 5 mg by mouth daily.  01/27/15   Darlin Coco, MD  AZO-CRANBERRY PO Take 2 tablets by mouth daily.    [provider]  furosemide (LASIX) 20 MG tablet Take 10 mg by mouth 2 (two) times daily as needed for edema.     [provider]  guaiFENesin (MUCINEX CHEST CONGESTION CHILD) 100 MG/5ML liquid Take 10 mLs (200 mg total) by mouth 3 (three) times daily as needed for cough or congestion. 03/03/16   Crecencio Mc, MD  ipratropium-albuterol (DUONEB) 0.5-2.5 (3) MG/3ML SOLN Take 3 mLs by nebulization every 6 (six) hours as needed (cough).  [provider]  loperamide (IMODIUM A-D) 2 MG tablet Take 2 mg by mouth as needed for diarrhea or loose stools.     [provider]  LORazepam (ATIVAN) 0.5 MG tablet Take 1 tablet (0.5 mg total) by mouth every 4 (four) hours as needed for anxiety (/agitation/restlessness). 09/06/17   Crecencio Mc, MD  meloxicam (MOBIC) 7.5 MG tablet TAKE 1 TABLET (7.5 MG TOTAL) BY MOUTH DAILY. 11/14/15   Crecencio Mc, MD  Multiple  Vitamins-Minerals (EQ MULTIVITAMINS ADULT GUMMY) CHEW Chew 1 tablet by mouth 2 (two) times daily before lunch and supper. 03/03/16   Crecencio Mc, MD  promethazine (PHENERGAN) 12.5 MG tablet Take 1 tablet (12.5 mg total) by mouth every 8 (eight) hours as needed for nausea or vomiting. 03/03/16   Crecencio Mc, MD  QUEtiapine (SEROQUEL) 25 MG tablet Take 1 tablet (25 mg total) by mouth at bedtime. Patient taking differently: Take 12.5 mg by mouth daily. In the afternoon 09/10/16   Crecencio Mc, MD  senna-docusate (SENOKOT-S) 8.6-50 MG tablet Take 1 tablet by mouth every 3 (three) days.    [provider]    Allergies Asa buff (mag [buffered aspirin]; Lipitor [atorvastatin calcium]; Seroquel [quetiapine fumarate]; and Zocor [simvastatin - high dose]  Family History  Problem Relation Age of Onset  . Stroke Sister   . Heart disease Mother   . Stroke Mother   . Stroke Sister   . Cancer Maternal Aunt        breast CA  . Cancer Cousin        breast cancer  . Stroke Sister     Social History Social History   Tobacco Use  . Smoking status: Never Smoker  . Smokeless tobacco: Never Used  Substance Use Topics  . Alcohol use: No  . Drug use: No    Review of Systems Constitutional: No fever/chills Eyes: No visual changes. ENT: No sore throat. Cardiovascular: Denies chest pain. Respiratory: Denies shortness of breath. Gastrointestinal: No abdominal pain.  No nausea, no vomiting.  No diarrhea.  No constipation.  Positive for hematemesis and melena Genitourinary: Negative for dysuria. Musculoskeletal: Negative for neck pain.  Negative for back pain. Integumentary: Negative for rash. Neurological: Negative for headaches, focal weakness or numbness.   ____________________________________________   PHYSICAL EXAM:  VITAL SIGNS: ED Triage Vitals  Enc Vitals Group     BP 01/21/18 0557 (!) 102/51     Pulse Rate 01/21/18 0557 87     Resp 01/21/18 0557 20     Temp  01/21/18 0557 97.7 F (36.5 C)     Temp Source 01/21/18 0557 Oral     SpO2 01/21/18 0557 100 %     Weight 01/21/18 0556 45.4 kg (100 lb)     Height --      Head Circumference --      Peak Flow --      Pain Score --      Pain Loc --      Pain Edu? --      Excl. in [place]? --     Constitutional: Alert but somnolent. Eyes: Conjunctivae are pale Mouth/Throat: Mucous membranes are moist. Oropharynx non-erythematous.  Coffee-ground emesis noted in mouth Neck: No stridor.   Cardiovascular: Normal rate, regular rhythm. Good peripheral circulation. Grossly normal heart sounds. Respiratory: Normal respiratory effort.  No retractions. Lungs CTAB. Gastrointestinal: Soft and nontender. No distention.  Guaiac positive stools Musculoskeletal: No lower extremity tenderness nor edema. No gross deformities  of extremities. Neurologic:  Normal speech and language. No gross focal neurologic deficits are appreciated.  Skin:  Skin is very pale Psychiatric: Mood and affect are normal. Speech and behavior are normal.  ____________________________________________   LABS (all labs ordered are listed, but only abnormal results are displayed)  Labs Reviewed  COMPREHENSIVE METABOLIC PANEL - Abnormal; Notable for the following components:      Result Value   Sodium 133 (*)    Potassium 5.3 (*)    Glucose, Bld 113 (*)    BUN 50 (*)    Creatinine, Ser 4.26 (*)    Calcium 8.2 (*)    Total Protein 6.0 (*)    Albumin 3.0 (*)    Alkaline Phosphatase 148 (*)    GFR calc non Af Amer 8 (*)    GFR calc Af Amer 9 (*)    All other components within normal limits  CBC WITH DIFFERENTIAL/PLATELET - Abnormal; Notable for the following components:   WBC 12.4 (*)    RBC 2.14 (*)    Hemoglobin 6.2 (*)    HCT 20.5 (*)    Platelets 470 (*)    Neutro Abs 9.5 (*)    Abs Immature Granulocytes 0.10 (*)    All other components within normal limits  PROTIME-INR  TYPE AND SCREEN  PREPARE RBC (CROSSMATCH)    ____________________________________________  EKG  ED ECG REPORT I, Occidental N BROWN, the attending physician, personally viewed and interpreted this ECG.   Date: 01/21/2018  EKG Time: 5:57 AM  Rate: 90  Rhythm: Sinus rhythm with PAC  Axis: Normal  Intervals: Irregular RR interval, prolonged QT  ST&T Change: No ST segment elevation or depression.  ____________________________________________   .Critical Care Performed by: Gregor Hams, MD Authorized by: Gregor Hams, MD   Critical care provider statement:    Critical care time (minutes):  60   Critical care time was exclusive of:  Separately billable procedures and treating other patients and teaching time   Critical care was necessary to treat or prevent imminent or life-threatening deterioration of the following conditions:  Shock (Gastrointestinal bleeding)   Critical care was time spent personally by me on the following activities:  Development of treatment plan with patient or surrogate, discussions with consultants, evaluation of patient's response to treatment, examination of patient, obtaining history from patient or surrogate, ordering and performing treatments and interventions, ordering and review of laboratory studies, ordering and review of radiographic studies, pulse oximetry, re-evaluation of patient's condition and review of old charts   I assumed direction of critical care for this patient from another provider in my specialty: no       ____________________________________________   INITIAL IMPRESSION / ASSESSMENT AND PLAN / ED COURSE  As part of my medical decision making, I reviewed the following data within the electronic MEDICAL RECORD [id number] year old female presenting with above-stated history and physical exam consistent with upper GI bleed with hemorrhagic shock.  As such 2 units uncrossed match blood ordered and administered with improvement of blood pressure.  Patient also given Protonix bolus  and infusion patient discussed with Dr. Marcille Blanco for hospital admission for further evaluation and management of gastrointestinal bleeding with hemorrhagic shock.  Given patient's altered mental status patient unable to give consent for blood transfusion and as such joint decision was made with the patient's daughter who agreed to blood transfusion___________________________  FINAL CLINICAL IMPRESSION(S) / ED DIAGNOSES  Final diagnoses:  Gastrointestinal hemorrhage, unspecified gastrointestinal hemorrhage type  Hemorrhagic shock ([place])  MEDICATIONS GIVEN DURING THIS VISIT:  Medications  pantoprazole (PROTONIX) 80 mg in sodium chloride 0.9 % 100 mL IVPB (has no administration in time range)  pantoprazole (PROTONIX) 80 mg in sodium chloride 0.9 % 250 mL (0.32 mg/mL) infusion (has no administration in time range)  pantoprazole (PROTONIX) injection 40 mg (40 mg Intravenous Given 01/21/18 0636)  pantoprazole (PROTONIX) 40 MG injection (has no administration in time range)  0.9 %  sodium chloride infusion (has no administration in time range)     ED Discharge Orders    None       Note:  This document was prepared using Dragon voice recognition software and [place] include unintentional dictation errors.    Gregor Hams, MD 01/21/18 [phone]

## 2018-01-21 NOTE — ED Notes (Signed)
ED TO INPATIENT HANDOFF REPORT  Name/Age/Gender Cindy Harvey 84 y.o. female  Code Status Code Status History    Date Active Date Inactive Code Status Order ID Comments User Context   01/08/2018 0109 01/09/2018 1922 DNR [id number]  Lance Coon, MD Inpatient   02/24/2016 0200 02/26/2016 1952 DNR [id number]  Lance Coon, MD Inpatient    Questions for Most Recent Historical Code Status (Order [id number])    Question Answer Comment   In the event of cardiac or respiratory ARREST Do not call a "code blue"    In the event of cardiac or respiratory ARREST Do not perform Intubation, CPR, defibrillation or ACLS    In the event of cardiac or respiratory ARREST Use medication by any route, position, wound care, and other measures to relive pain and suffering. [place] use oxygen, suction and manual treatment of airway obstruction as needed for comfort.         Advance Directive Documentation     Most Recent Value  Type of Advance Directive  Out of facility DNR (pink MOST or yellow form)  Pre-existing out of facility DNR order (yellow form or pink MOST form)  -  "MOST" Form in Place?  -      Home/SNF/Other Nursing Home  Chief Complaint EMS Poss GI Bleed  Level of Care/Admitting Diagnosis ED Disposition    ED Disposition Condition [place]: [place] [100120]  Level of Care: Stepdown [14]  Diagnosis: GI bleed [[id number]]  Admitting Physician: Harrie Foreman [[id number]]  Attending Physician: Harrie Foreman [[id number]]  Estimated length of stay: past midnight tomorrow  Certification:: I certify this patient will need inpatient services for at least 2 midnights  PT Class (Do Not Modify): Inpatient [101]  PT Acc Code (Do Not Modify): Private [1]       Medical History Past Medical History:  Diagnosis Date  . Allergy    allergic rhinitis  . Hearing loss   . History of shingles   . Hypercholesterolemia   . Hypertension   . Macular  degeneration   . Osteoarthritis    right knee  . Pneumonia 2009   with dehydration and electrolyte imbalance  . Urinary incontinence     Allergies Allergies  Allergen Reactions  . Asa Buff (Mag [Buffered Aspirin] Other (See Comments)    Nose bleeds  . Lipitor [Atorvastatin Calcium] Other (See Comments)    Headaches & dizzy  . Seroquel [Quetiapine Fumarate]   . Zocor [Simvastatin - High Dose] Other (See Comments)    myalgia    IV Location/Drains/Wounds Patient Lines/Drains/Airways Status   Active Line/Drains/Airways    Name:   Placement date:   Placement time:   Site:   Days:   Peripheral IV 01/21/18 Left Wrist   01/21/18    0603    Wrist   less than 1   Peripheral IV 01/21/18 Right Antecubital   01/21/18    0649    Antecubital   less than 1   Wound 06/27/11 Abrasion(s) Nose Mid laceration to bridge of nose- tegraderm intact to face bleeding controlled- facial selling.  area cleaned with sterile dressing applied. Pt toelrated- family given instructions for care   06/27/11    1601    Nose   2400          Labs/Imaging Results for orders placed or performed during the hospital encounter of 01/21/18 (from the past 48 hour(s))  Comprehensive metabolic panel  Status: Abnormal   Collection Time: 01/21/18  6:06 AM  Result Value Ref Range   Sodium 133 (L) 135 - 145 mmol/L   Potassium 5.3 (H) 3.5 - 5.1 mmol/L   Chloride 100 98 - 111 mmol/L   CO2 22 22 - 32 mmol/L   Glucose, Bld 113 (H) 70 - 99 mg/dL   BUN 50 (H) 8 - 23 mg/dL   Creatinine, Ser 4.26 (H) 0.44 - 1.00 mg/dL   Calcium 8.2 (L) 8.9 - 10.3 mg/dL   Total Protein 6.0 (L) 6.5 - 8.1 g/dL   Albumin 3.0 (L) 3.5 - 5.0 g/dL   AST 20 15 - 41 U/L   ALT 12 0 - 44 U/L   Alkaline Phosphatase 148 (H) 38 - 126 U/L   Total Bilirubin 0.4 0.3 - 1.2 mg/dL   GFR calc non Af Amer 8 (L) >60 mL/min   GFR calc Af Amer 9 (L) >60 mL/min   Anion gap 11 5 - 15    Comment: Performed at [facility], [address]  [place], [place] [address]  CBC WITH DIFFERENTIAL     Status: Abnormal   Collection Time: 01/21/18  6:06 AM  Result Value Ref Range   WBC 12.4 (H) 4.0 - 10.5 K/uL   RBC 2.14 (L) 3.87 - 5.11 MIL/uL   Hemoglobin 6.2 (L) 12.0 - 15.0 g/dL   HCT 20.5 (L) 36.0 - 46.0 %   MCV 95.8 80.0 - 100.0 fL   MCH 29.0 26.0 - 34.0 pg   MCHC 30.2 30.0 - 36.0 g/dL   RDW 13.3 11.5 - 15.5 %   Platelets 470 (H) 150 - 400 K/uL   nRBC 0.0 0.0 - 0.2 %   Neutrophils Relative % 76 %   Neutro Abs 9.5 (H) 1.7 - 7.7 K/uL   Lymphocytes Relative 14 %   Lymphs Abs 1.7 0.7 - 4.0 K/uL   Monocytes Relative 7 %   Monocytes Absolute 0.8 0.1 - 1.0 K/uL   Eosinophils Relative 2 %   Eosinophils Absolute 0.3 0.0 - 0.5 K/uL   Basophils Relative 0 %   Basophils Absolute 0.0 0.0 - 0.1 K/uL   Immature Granulocytes 1 %   Abs Immature Granulocytes 0.10 (H) 0.00 - 0.07 K/uL    Comment: Performed at [facility], [address] [place], [place] [address]  Protime-INR     Status: None   Collection Time: 01/21/18  6:06 AM  Result Value Ref Range   Prothrombin Time 13.7 11.4 - 15.2 seconds   INR 1.06     Comment: Performed at [facility] [address], [place], [place] [address]  ABO/Rh     Status: None   Collection Time: 01/21/18  6:06 AM  Result Value Ref Range   ABO/RH(D)      A NEG Performed at [facility], [address] [place],  [address]   Type and screen Ordered by PROVIDER DEFAULT     Status: None (Preliminary result)   Collection Time: 01/21/18  6:24 AM  Result Value Ref Range   ABO/RH(D) A NEG    Antibody Screen NEG    Sample Expiration 01/24/2018    Unit Number [id number]    Blood Component Type RED CELLS,LR    Unit division 00    Status of Unit ISSUED    Unit tag comment EMERGENCY RELEASE    Transfusion Status OK TO TRANSFUSE    Crossmatch Result COMPATIBLE    Unit Number  [id number]    Blood Component Type RED CELLS,LR    Unit division 00    Status of Unit  ISSUED    Unit tag comment EMERGENCY RELEASE    Transfusion Status OK TO TRANSFUSE    Crossmatch Result COMPATIBLE   Prepare RBC     Status: None   Collection Time: 01/21/18  6:42 AM  Result Value Ref Range   Order Confirmation      ORDER PROCESSED BY BLOOD BANK Performed at [facility], [address] [place], [place] [address]    No results found.  Pending Labs [employer] (From admission, onward)    Start     Ordered   Signed and Held  TSH  Add-on,   R     Signed and Held          Vitals/Pain Today's Vitals   01/21/18 0810 01/21/18 0811 01/21/18 0815 01/21/18 0830  BP: (!) 125/54   125/60  Pulse:  72  73  Resp:      Temp:   (!) 97.5 F (36.4 C)   TempSrc:   Oral   SpO2:  99%  99%  Weight:      PainSc:        Isolation Precautions No active isolations  Medications Medications  pantoprazole (PROTONIX) 80 mg in sodium chloride 0.9 % 250 mL (0.32 mg/mL) infusion (8 mg/hr Intravenous New Bag/Given 01/21/18 0808)  pantoprazole (PROTONIX) injection 40 mg (40 mg Intravenous Given 01/21/18 0636)  pantoprazole (PROTONIX) 40 MG injection (has no administration in time range)  0.9 %  sodium chloride infusion (has no administration in time range)  pantoprazole (PROTONIX) 80 mg in sodium chloride 0.9 % 100 mL IVPB (0 mg Intravenous Stopped 01/21/18 0805)    Mobility

## 2018-01-21 NOTE — ED Notes (Signed)
This RN attempted to call report to ICU, ICU will not take report at this time. They will call us back

## 2018-01-21 NOTE — Consult Note (Addendum)
[facility] GI Inpatient Consult Note   Kathline Magic, M.D.  Reason for Consult: GI bleeding, coffee ground emesis   Attending Requesting Consult: Rosilyn Mings, M.D.   History of Present Illness: Cindy Harvey is a 84 y.o. female with a history of advanced stage, osteoarthritis and pneumonia presenting to the emergency room after transfer by her nursing home for several episodes of coffee-ground emesis.  Patient's daughter is at the bedside and concurs with reported history.  According to the daughter, the patient has never suffered any significant previous gastrointestinal bleeding and has not previously undergone colonoscopy or upper endoscopy.  The patient appears alert although mildly confused and denies pain.  Besides buffered aspirin, there is no known history of blood thinner use.  The patient's anemia apparently began in March of this year when she suffered an unresolving nosebleed.  Patient was sent to [facility] for blood transfusion but then refused to have the blood transfusion.  The hemoglobin and hematocrit are failed to increase as a result of no blood or IV iron support.  Past Medical History:  Past Medical History:  Diagnosis Date  . Allergy    allergic rhinitis  . Hearing loss   . History of shingles   . Hypercholesterolemia   . Hypertension   . Macular degeneration   . Osteoarthritis    right knee  . Pneumonia 2009   with dehydration and electrolyte imbalance  . Urinary incontinence     Problem List: Patient Active Problem List   Diagnosis Date Noted  . GI bleed 01/21/2018  . Hemorrhagic shock ([place])   . Nausea & vomiting 01/08/2018  . Dehydration 01/08/2018  . Epistaxis 04/18/2017  . Hospital discharge follow-up 03/06/2016  . CVA (cerebral vascular accident) ([place]) 02/25/2016  . Encounter for end of life care 08/23/2015  . 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 Generalized anxiety disorder 12/04/2013  . History of fall  09/28/2013  . DNR (do not resuscitate) discussion 09/26/2013  . Hyponatremia 09/26/2013  . End stage kidney disease ([place]) 01/31/2013  . Heart murmur 07/21/2011  . Post-menopausal 07/14/2010  . Osteoporosis screening 07/14/2010  . Hordeolum 07/07/2010  . Depression (emotion) 05/21/2010  . HLD (hyperlipidemia) 10/24/2008  . Essential hypertension 10/24/2008  . ALLERGIC RHINITIS 10/24/2008  . Osteoarthrosis involving lower leg 10/24/2008    Past Surgical History: Past Surgical History:  Procedure Laterality Date  . TONSILLECTOMY  1928    Allergies: Allergies  Allergen Reactions  . Asa Buff (Mag [Buffered Aspirin] Other (See Comments)    Nose bleeds  . Lipitor [Atorvastatin Calcium] Other (See Comments)    Headaches & dizzy  . Seroquel [Quetiapine Fumarate]   . Zocor [Simvastatin - High Dose] Other (See Comments)    myalgia    Home Medications: Medications Prior to Admission  Medication Sig Dispense Refill Last Dose  . acetaminophen (TYLENOL CHILDRENS) 160 MG/5ML suspension Take 20 mLs (640 mg total) by mouth every 6 (six) hours as needed. (Patient taking differently: Take 640 mg by mouth every 4 (four) hours as needed for fever (pain). ) 118 mL 0 prn at prn  . alum & mag hydroxide-simeth (MAALOX/MYLANTA) 200-200-20 MG/5ML suspension Take 15 mLs by mouth 2 (two) times daily as needed for indigestion or heartburn.   prn at prn  . amLODipine (NORVASC) 5 MG tablet TAKE 1 TABLET (5 MG TOTAL) BY MOUTH DAILY. (Patient taking differently: Take 5 mg by mouth daily. ) 30 tablet 11 01/20/2018 at Unknown time  .  furosemide (LASIX) 20 MG tablet Take 20 mg by mouth 2 (two) times daily as needed for edema.    prn at prn  . ipratropium-albuterol (DUONEB) 0.5-2.5 (3) MG/3ML SOLN Take 3 mLs by nebulization every 6 (six) hours as needed (cough, congestion).    prn at prn  . loperamide (IMODIUM A-D) 2 MG tablet Take 2 mg by mouth as needed for diarrhea or loose stools.    prn at prn  . LORazepam  (ATIVAN) 0.5 MG tablet Take 1 tablet (0.5 mg total) by mouth every 4 (four) hours as needed for anxiety (/agitation/restlessness). 30 tablet 5 prn at prn  . meloxicam (MOBIC) 7.5 MG tablet TAKE 1 TABLET (7.5 MG TOTAL) BY MOUTH DAILY. 30 tablet 5 01/20/2018 at Unknown time  . ondansetron (ZOFRAN) 4 MG tablet Take 4 mg by mouth every 8 (eight) hours as needed for nausea or vomiting.   prn at prn  . QUEtiapine (SEROQUEL) 25 MG tablet Take 1 tablet (25 mg total) by mouth at bedtime. (Patient taking differently: Take 12.5 mg by mouth daily. In the afternoon) 30 tablet 5 01/20/2018 at Unknown time  . acetaminophen (TYLENOL) 160 MG/5ML liquid Take 10.2 mLs (325 mg total) by mouth daily as needed for fever (knee pain or headache). (Patient not taking: Reported on 01/21/2018) 473 mL 5 Not Taking at Unknown time  . guaiFENesin (MUCINEX CHEST CONGESTION CHILD) 100 MG/5ML liquid Take 10 mLs (200 mg total) by mouth 3 (three) times daily as needed for cough or congestion. (Patient not taking: Reported on 01/21/2018) 120 mL 3 Not Taking at Unknown time  . Multiple Vitamins-Minerals (EQ MULTIVITAMINS ADULT GUMMY) CHEW Chew 1 tablet by mouth 2 (two) times daily before lunch and supper. (Patient not taking: Reported on 01/21/2018) 60 tablet 11 Not Taking at Unknown time  . promethazine (PHENERGAN) 12.5 MG tablet Take 1 tablet (12.5 mg total) by mouth every 8 (eight) hours as needed for nausea or vomiting. (Patient not taking: Reported on 01/21/2018) 20 tablet 3 Not Taking at Unknown time   Home medication reconciliation was completed with the patient.   Scheduled Inpatient Medications:   . docusate sodium  100 mg Oral BID  . pantoprazole      . [START ON 01/24/2018] pantoprazole  40 mg Intravenous Q12H    Continuous Inpatient Infusions:   . sodium chloride    . sodium chloride 100 mL/hr (01/21/18 0934)  . pantoprozole (PROTONIX) infusion 8 mg/hr (01/21/18 6433)    PRN Inpatient Medications:    Family  History: family history includes Cancer in her cousin and maternal aunt; Heart disease in her mother; Stroke in her mother, sister, sister, and sister.   GI Family History: Negative.  Social History:   reports that she has never smoked. She has never used smokeless tobacco. She reports that she does not drink alcohol or use drugs. The patient denies ETOH, tobacco, or drug use.    Review of Systems: Review of Systems - History obtained from Daughter of the patient General ROS: positive for  - Gastrointestinal bleeding as noted above negative for - chills, fever, night sweats or sleep disturbance  Physical Examination: BP (!) 130/42   Pulse 77   Temp 97.9 F (36.6 C) (Oral)   Resp 10   Wt 45.4 kg   SpO2 100%   BMI 18.89 kg/m  Physical Exam  Constitutional: She appears well-developed. No distress.  HENT:  Head: Normocephalic and atraumatic.  Eyes: Pupils are equal, round, and reactive to light.  Neck: Neck supple. No tracheal deviation present. No thyromegaly present.  Cardiovascular: Normal rate.  Pulmonary/Chest: Effort normal and breath sounds normal.  Abdominal: Soft. Bowel sounds are normal. She exhibits no distension. There is no tenderness.  Musculoskeletal: She exhibits no tenderness or deformity.  Neurological: She is alert.  Skin: Skin is warm and dry.  Psychiatric: She has a normal mood and affect.    Data: Lab Results  Component Value Date   WBC 12.4 (H) 01/21/2018   HGB 6.2 (L) 01/21/2018   HCT 20.5 (L) 01/21/2018   MCV 95.8 01/21/2018   PLT 470 (H) 01/21/2018   Recent Labs  Lab 01/21/18 0606  HGB 6.2*   Lab Results  Component Value Date   NA 133 (L) 01/21/2018   K 5.3 (H) 01/21/2018   CL 100 01/21/2018   CO2 22 01/21/2018   BUN 50 (H) 01/21/2018   CREATININE 4.26 (H) 01/21/2018   Lab Results  Component Value Date   ALT 12 01/21/2018   AST 20 01/21/2018   ALKPHOS 148 (H) 01/21/2018   BILITOT 0.4 01/21/2018   Recent Labs  Lab  01/21/18 0606  INR 1.06   CBC Latest Ref Rng & Units 01/21/2018 01/08/2018 01/07/2018  WBC 4.0 - 10.5 K/uL 12.4(H) 11.5(H) 11.6(H)  Hemoglobin 12.0 - 15.0 g/dL 6.2(L) 7.8(L) 8.1(L)  Hematocrit 36.0 - 46.0 % 20.5(L) 24.9(L) 26.3(L)  Platelets 150 - 400 K/uL 470(H) 457(H) 513(H)    STUDIES: No results found. @IMAGES @  Assessment:  1. Advanced age 2. Acute on chronic anemia - secondary to blood loss. 3. Hypotensive shock - improved after two units of prbc transfusion and IV resuscitation. 4. DNR status. 5. Upper GI bleed, leading to anemia. DDx mallory weiss tear, PUD, gastritis, malignancy, etc.  Recommendations: 1. IV hydration. 2. Serial H/H. 3. Serial exams. 4. I discussed EGD, particularly given the fact that patient is in hospice care and is DNR.  Patient's daughter, Conni Elliot, agrees to perform the procedure in theory after discussion of the indications, risks, alternatives and potential complications of the procedure including but not limited to bleeding, perforation, infection, oversedation and rupture or damage to internal organs. Patient's daughter would like for [place] to plan for procedure but wishes to discuss with family to confirm everyone is in agreement. They will sign consent if everyone agrees.    Thank you for the consult. Please call with questions or concerns.  Olean Ree, "Lanny Hurst MD [facility] [place], [place] [address] [phone]  01/21/2018 1:34 PM

## 2018-01-21 NOTE — ED Notes (Signed)
Attempted to call report. RN busy, will call back.

## 2018-01-21 NOTE — Progress Notes (Signed)
Advanced care plan.  Purpose of the Encounter: CODE STATUS  Parties in Attendance: Patient and patient's daughter in the emergency room  Patient's Decision Capacity: Okay  Subjective/Patient's story: Presented for vomiting of blood Resident of assisted living facility  Objective/Medical story Patient has active GI bleed and anemia Needs PRBC transfusion and gastroenterology work-up with possible EGD and colonoscopy   Goals of care determination:  Advance care directives goals of care and treatment plan discussed which includes admission to the hospital, IV fluids, PRBC transfusion Patient and family do not want CPR, intubation ventilator if the need arises   CODE STATUS: DNR   Time spent discussing advanced care planning: 16 minutes

## 2018-01-21 NOTE — Progress Notes (Signed)
[facility] [place] NAME: Cindy Harvey    MR#:  [id number]  DATE OF BIRTH:  06/13/1916  SUBJECTIVE:  CHIEF COMPLAINT:   Chief Complaint  Patient presents with  . GI Problem  . Hematemesis  Patient seen and evaluated Tolerated PRBC transfusion while in the emergency room Had vomiting or blood Awake and responds to all verbal commands No abdominal pain Blood pressure improved with IV fluids Patient was hypotensive in the emergency room and received IV fluids  REVIEW OF SYSTEMS:    ROS  CONSTITUTIONAL: No documented fever. Has fatigue, weakness. No weight gain, no weight loss.  EYES: No blurry or double vision.  ENT: No tinnitus. No postnasal drip. No redness of the oropharynx.  RESPIRATORY: No cough, no wheeze, no hemoptysis. No dyspnea.  CARDIOVASCULAR: No chest pain. No orthopnea. No palpitations. No syncope.  GASTROINTESTINAL: Has nausea, no vomiting or diarrhea. No abdominal pain.  Vomited blood GENITOURINARY: No dysuria or hematuria.  ENDOCRINE: No polyuria or nocturia. No heat or cold intolerance.  HEMATOLOGY: No anemia. No bruising. No bleeding.  INTEGUMENTARY: No rashes. No lesions.  MUSCULOSKELETAL: No arthritis. No swelling. No gout.  NEUROLOGIC: No numbness, tingling, or ataxia. No seizure-type activity.  PSYCHIATRIC: No anxiety. No insomnia. No ADD.   DRUG ALLERGIES:   Allergies  Allergen Reactions  . Asa Buff (Mag [Buffered Aspirin] Other (See Comments)    Nose bleeds  . Lipitor [Atorvastatin Calcium] Other (See Comments)    Headaches & dizzy  . Seroquel [Quetiapine Fumarate]   . Zocor [Simvastatin - High Dose] Other (See Comments)    myalgia    VITALS:  Blood pressure (!) 130/42, pulse 77, temperature 97.9 F (36.6 C), temperature source Oral, resp. rate 10, weight 45.4 kg, SpO2 100 %.  PHYSICAL EXAMINATION:   Physical Exam  GENERAL:  84 y.o.-year-old patient lying in the bed with no acute distress.   EYES: Pupils equal, round, reactive to light and accommodation. No scleral icterus. Extraocular muscles intact.  HEENT: Head atraumatic, normocephalic. Oropharynx dry and nasopharynx clear.  NECK:  Supple, no jugular venous distention. No thyroid enlargement, no tenderness.  LUNGS: Normal breath sounds bilaterally, no wheezing, rales, rhonchi. No use of accessory muscles of respiration.  CARDIOVASCULAR: S1, S2 normal. No murmurs, rubs, or gallops.  ABDOMEN: Soft, nontender, nondistended. Bowel sounds present. No organomegaly or mass.  EXTREMITIES: No cyanosis, clubbing or edema b/l.    NEUROLOGIC: Cranial nerves II through XII are intact. No focal Motor or sensory deficits b/l.   PSYCHIATRIC: The patient is alert and oriented x 2.  SKIN: No obvious rash, lesion, or ulcer.   LABORATORY PANEL:   CBC Recent Labs  Lab 01/21/18 0606  WBC 12.4*  HGB 6.2*  HCT 20.5*  PLT 470*   ------------------------------------------------------------------------------------------------------------------ Chemistries  Recent Labs  Lab 01/21/18 0606  NA 133*  K 5.3*  CL 100  CO2 22  GLUCOSE 113*  BUN 50*  CREATININE 4.26*  CALCIUM 8.2*  AST 20  ALT 12  ALKPHOS 148*  BILITOT 0.4   ------------------------------------------------------------------------------------------------------------------  Cardiac Enzymes No results for input(s): TROPONINI in the last 168 hours. ------------------------------------------------------------------------------------------------------------------  RADIOLOGY:  No results found.   ASSESSMENT AND PLAN:   84 year old elderly female patient with history of hyperlipidemia, hypertension, osteoarthritis, macular degeneration, hearing loss presented to the emergency room for hematemesis  -Acute gastrointestinal bleeding Monitor patient in ICU for close monitoring PRBC transfusions IV Protonix N.p.o. Gastroenterology consultation  -Hemorrhagic shock  secondary to hematemesis IV fluids and PRBC transfusion  -Acute kidney injury Elevated BUN secondary to GI bleed IV fluid hydration  -DVT prophylaxis sequential compression device to lower extremities  All the records are reviewed and case discussed with Care Management/Social Worker. Management plans discussed with the patient, family and they are in agreement.  CODE STATUS: DNR  DVT Prophylaxis: SCDs  TOTAL TIME TAKING CARE OF THIS PATIENT: 45 minutes.   POSSIBLE D/C IN 3 to 4 DAYS, DEPENDING ON CLINICAL CONDITION.  [name] M.D on 01/21/2018 at 11:12 AM  Between 7am to 6pm - Pager - [phone]  After 6pm go to [email or web address] - password [email or web address] [place] [facility]  Office  [phone]  CC: Primary care physician; Crecencio Mc, MD  Note: This dictation was prepared with Dragon dictation along with smaller phrase technology. Any transcriptional errors that result from this process are unintentional.

## 2018-01-21 NOTE — ED Notes (Signed)
Levada Dy RN, aware of bed assigned

## 2018-01-21 NOTE — ED Notes (Signed)
Dr. Owens Shark at bedside to speak to patient about blood transfusion.

## 2018-01-21 NOTE — Consult Note (Signed)
Name: Cindy Harvey MRN: [id number] DOB: 1917-01-17     CONSULTATION DATE: 01/21/2018  HISTORY OF PRESENT ILLNESS:  84 years old lady with history of hypertension and hearing loss, CKD, lives in assisted living.  Patient presented to ED after having multiple episodes of coffee-ground emesis while in the ED blood pressure dropped patient received 2 units of RBCs and was started on Protonix drip.  Admission diagnosis of GI bleeding and hemorrhagic shock with acute kidney injury and hyperkalemia All history was obtained the form nursing, family at the bedside and EMR. Patient arrived to the intensive care unit awake on room air, no distress and on Protonix drip  PAST MEDICAL HISTORY :   has a past medical history of Allergy, Hearing loss, History of shingles, Hypercholesterolemia, Hypertension, Macular degeneration, Osteoarthritis, Pneumonia (2009), and Urinary incontinence.  has a past surgical history that includes Tonsillectomy (1928). Prior to Admission medications   Medication Sig Start Date End Date Taking? Authorizing Provider  acetaminophen (TYLENOL CHILDRENS) 160 MG/5ML suspension Take 20 mLs (640 mg total) by mouth every 6 (six) hours as needed. Patient taking differently: Take 640 mg by mouth every 4 (four) hours as needed for fever (pain).  11/14/17  Yes Crecencio Mc, MD  alum & mag hydroxide-simeth (MAALOX/MYLANTA) 200-200-20 MG/5ML suspension Take 15 mLs by mouth 2 (two) times daily as needed for indigestion or heartburn.   Yes [provider]  amLODipine (NORVASC) 5 MG tablet TAKE 1 TABLET (5 MG TOTAL) BY MOUTH DAILY. Patient taking differently: Take 5 mg by mouth daily.  01/27/15  Yes Darlin Coco, MD  furosemide (LASIX) 20 MG tablet Take 20 mg by mouth 2 (two) times daily as needed for edema.    Yes [provider]  ipratropium-albuterol (DUONEB) 0.5-2.5 (3) MG/3ML SOLN Take 3 mLs by nebulization every 6 (six) hours as needed (cough, congestion).    Yes  [provider]  loperamide (IMODIUM A-D) 2 MG tablet Take 2 mg by mouth as needed for diarrhea or loose stools.    Yes [provider]  LORazepam (ATIVAN) 0.5 MG tablet Take 1 tablet (0.5 mg total) by mouth every 4 (four) hours as needed for anxiety (/agitation/restlessness). 09/06/17  Yes Crecencio Mc, MD  meloxicam (MOBIC) 7.5 MG tablet TAKE 1 TABLET (7.5 MG TOTAL) BY MOUTH DAILY. 11/14/15  Yes Crecencio Mc, MD  ondansetron (ZOFRAN) 4 MG tablet Take 4 mg by mouth every 8 (eight) hours as needed for nausea or vomiting.   Yes [provider]  QUEtiapine (SEROQUEL) 25 MG tablet Take 1 tablet (25 mg total) by mouth at bedtime. Patient taking differently: Take 12.5 mg by mouth daily. In the afternoon 09/10/16  Yes Crecencio Mc, MD  acetaminophen (TYLENOL) 160 MG/5ML liquid Take 10.2 mLs (325 mg total) by mouth daily as needed for fever (knee pain or headache). Patient not taking: Reported on 01/21/2018 03/03/16   Crecencio Mc, MD  guaiFENesin [facility] CHEST CONGESTION CHILD) 100 MG/5ML liquid Take 10 mLs (200 mg total) by mouth 3 (three) times daily as needed for cough or congestion. Patient not taking: Reported on 01/21/2018 03/03/16   Crecencio Mc, MD  Multiple Vitamins-Minerals (EQ MULTIVITAMINS ADULT GUMMY) CHEW Chew 1 tablet by mouth 2 (two) times daily before lunch and supper. Patient not taking: Reported on 01/21/2018 03/03/16   Crecencio Mc, MD  promethazine (PHENERGAN) 12.5 MG tablet Take 1 tablet (12.5 mg total) by mouth every 8 (eight) hours as needed for  nausea or vomiting. Patient not taking: Reported on 01/21/2018 03/03/16   Crecencio Mc, MD   Allergies  Allergen Reactions  . Asa Buff (Mag [Buffered Aspirin] Other (See Comments)    Nose bleeds  . Lipitor [Atorvastatin Calcium] Other (See Comments)    Headaches & dizzy  . Seroquel [Quetiapine Fumarate]   . Zocor [Simvastatin - High Dose] Other (See Comments)    myalgia    FAMILY HISTORY:    family history includes Cancer in her cousin and maternal aunt; Heart disease in her mother; Stroke in her mother, sister, sister, and sister. SOCIAL HISTORY:  reports that she has never smoked. She has never used smokeless tobacco. She reports that she does not drink alcohol or use drugs.  REVIEW OF SYSTEMS:   Unable to obtain due to critical illness   VITAL SIGNS: Temp:  [97.5 F (36.4 C)-97.9 F (36.6 C)] 97.9 F (36.6 C) (11/30 0930) Pulse Rate:  [71-87] 77 (11/30 0930) Resp:  [10-20] 10 (11/30 0930) BP: (81-133)/(31-99) 130/42 (11/30 0930) SpO2:  [98 %-100 %] 100 % (11/30 0930) Weight:  [45.4 kg] 45.4 kg (11/30 0556)  Physical Examination:  Awake and oriented with no focal neurological deficits Tolerating room air, no distress, able to talk in full sentences, bilateral equal air entry and no adventitious sounds S1 & S2 are audible with no murmur Benign abdominal exam with normal peristalsis Wasted extremities with no peripheral edema   ASSESSMENT / PLAN:  GI bleeding -PPI and follow with GI for further management  AKI with hyponatremia and mild hyperkalemia on CKD due to intravascular volume depletion -Optimize volume, temporizing agent for hyperkalemia, avoid nephrotoxins, monitor renal panel and urine output  Anemia with blood loss -Transfuse to keep hemoglobin more than 8 g/dL. -Monitor H&H  Hemorrhagic shock responded to transfusion -Monitor hemodynamics  DNR  Supportive care Patient and family (daughter) were updated and he agreed to the plan of care  Critical care time 45 minutes

## 2018-01-22 ENCOUNTER — Encounter
Admission: EM | Disposition: A | Discharge status: Hospice | Payer: Self-pay | Source: Home / Self Care | Attending: Internal Medicine

## 2018-01-22 LAB — CBC WITH DIFFERENTIAL/PLATELET
Abs Immature Granulocytes: 0.07 10*3/uL (ref 0.00–0.07)
Basophils Absolute: 0 10*3/uL (ref 0.0–0.1)
Basophils Relative: 0 %
Eosinophils Absolute: 0.1 10*3/uL (ref 0.0–0.5)
Eosinophils Relative: 1 %
HCT: 24.5 % — ABNORMAL LOW (ref 36.0–46.0)
HEMOGLOBIN: 7.9 g/dL — AB (ref 12.0–15.0)
Immature Granulocytes: 1 %
LYMPHS ABS: 1.8 10*3/uL (ref 0.7–4.0)
Lymphocytes Relative: 18 %
MCH: 29.4 pg (ref 26.0–34.0)
MCHC: 32.2 g/dL (ref 30.0–36.0)
MCV: 91.1 fL (ref 80.0–100.0)
Monocytes Absolute: 0.7 10*3/uL (ref 0.1–1.0)
Monocytes Relative: 7 %
NEUTROS PCT: 73 %
Neutro Abs: 7.2 10*3/uL (ref 1.7–7.7)
Platelets: 349 10*3/uL (ref 150–400)
RBC: 2.69 MIL/uL — ABNORMAL LOW (ref 3.87–5.11)
RDW: 14.4 % (ref 11.5–15.5)
WBC: 9.9 10*3/uL (ref 4.0–10.5)
nRBC: 0 % (ref 0.0–0.2)

## 2018-01-22 LAB — HEMOGLOBIN AND HEMATOCRIT, BLOOD
HCT: 29 % — ABNORMAL LOW (ref 36.0–46.0)
HCT: 27 % — ABNORMAL LOW (ref 36.0–46.0)
HCT: 28.8 % — ABNORMAL LOW (ref 36.0–46.0)
HEMATOCRIT: 29.9 % — AB (ref 36.0–46.0)
Hemoglobin: 9.3 g/dL — ABNORMAL LOW (ref 12.0–15.0)
Hemoglobin: 8.3 g/dL — ABNORMAL LOW (ref 12.0–15.0)
Hemoglobin: 9.1 g/dL — ABNORMAL LOW (ref 12.0–15.0)
Hemoglobin: 9.2 g/dL — ABNORMAL LOW (ref 12.0–15.0)

## 2018-01-22 LAB — PHOSPHORUS: Phosphorus: 3.9 mg/dL (ref 2.5–4.6)

## 2018-01-22 LAB — MAGNESIUM: Magnesium: 2.7 mg/dL — ABNORMAL HIGH (ref 1.7–2.4)

## 2018-01-22 SURGERY — ESOPHAGOGASTRODUODENOSCOPY (EGD) WITH PROPOFOL
Anesthesia: Monitor Anesthesia Care

## 2018-01-22 MED ORDER — LORAZEPAM 2 MG/ML IJ SOLN: 0.2500 mg | INTRAMUSCULAR | Status: DC | PRN

## 2018-01-22 MED ORDER — LORAZEPAM 2 MG/ML IJ SOLN
0.2500 mg | Freq: Four times a day (QID) | INTRAMUSCULAR | Status: DC | PRN
  Administered 2018-01-22: 0.25 mg via INTRAVENOUS
  Filled 2018-01-22 (×2): qty 1

## 2018-01-22 NOTE — Progress Notes (Signed)
[facility] Progress Note Patient Name: Cindy Harvey DOB: October 22, 1916 MRN: [id number]   Date of Service  01/22/2018  HPI/Events of Note  Patient takes Ativan chronically and has been off it in the course of this admission. She has been extremely agitated raising concerns for withdrawal.  eICU Interventions  Ativan 0.25 mg iv Q 6 hrs prn agitation-to be held for excessive sedation.        Kerry Kass Naheim Burgen 01/22/2018, 1:44 AM

## 2018-01-22 NOTE — Progress Notes (Signed)
[facility] GI progress note  Notes and labs reviewed. Patient appears hemodynamically stable without further bleeding. Hgb 8.3 today after transfusions.  Afebrile.  Pleasant family has decided to forego any seemingly elective procedures for further investigation.  GI will sign off. Please call back if we can help.  Robet Leu, M.D. [phone] - cell

## 2018-01-22 NOTE — Progress Notes (Signed)
[facility] NAME: Cindy Harvey    MR#:  [id number]  DATE OF BIRTH:  April 18, 1916  SUBJECTIVE:  CHIEF COMPLAINT:   Chief Complaint  Patient presents with  . GI Problem  . Hematemesis  Patient seen and evaluated Tolerated PRBC transfusion while in the emergency room Hemoglobin has been stable No new episodes of vomiting of blood Currently wearing mittens to to prevent pulling out IV lines  REVIEW OF SYSTEMS:    ROS  CONSTITUTIONAL: No documented fever. Has fatigue, weakness. No weight gain, no weight loss.  EYES: No blurry or double vision.  ENT: No tinnitus. No postnasal drip. No redness of the oropharynx.  RESPIRATORY: No cough, no wheeze, no hemoptysis. No dyspnea.  CARDIOVASCULAR: No chest pain. No orthopnea. No palpitations. No syncope.  GASTROINTESTINAL: Has nausea, no vomiting or diarrhea. No abdominal pain.  No new episodes of vomiting of any blood GENITOURINARY: No dysuria or hematuria.  ENDOCRINE: No polyuria or nocturia. No heat or cold intolerance.  HEMATOLOGY: No anemia. No bruising. No bleeding.  INTEGUMENTARY: No rashes. No lesions.  MUSCULOSKELETAL: No arthritis. No swelling. No gout.  NEUROLOGIC: No numbness, tingling, or ataxia. No seizure-type activity.  PSYCHIATRIC: No anxiety. No insomnia. No ADD.   DRUG ALLERGIES:   Allergies  Allergen Reactions  . Asa Buff (Mag [Buffered Aspirin] Other (See Comments)    Nose bleeds  . Lipitor [Atorvastatin Calcium] Other (See Comments)    Headaches & dizzy  . Seroquel [Quetiapine Fumarate]   . Zocor [Simvastatin - High Dose] Other (See Comments)    myalgia    VITALS:  Blood pressure (!) 139/58, pulse (!) 56, temperature (!) 97.3 F (36.3 C), resp. rate 18, weight 45.1 kg, SpO2 96 %.  PHYSICAL EXAMINATION:   Physical Exam  GENERAL:  84 y.o.-year-old patient lying in the bed with no acute distress.  EYES: Pupils equal, round, reactive to light and  accommodation. No scleral icterus. Extraocular muscles intact.  HEENT: Head atraumatic, normocephalic. Oropharynx moist and nasopharynx clear.  NECK:  Supple, no jugular venous distention. No thyroid enlargement, no tenderness.  LUNGS: Normal breath sounds bilaterally, no wheezing, rales, rhonchi. No use of accessory muscles of respiration.  CARDIOVASCULAR: S1, S2 normal. No murmurs, rubs, or gallops.  ABDOMEN: Soft, nontender, nondistended. Bowel sounds present. No organomegaly or mass.  EXTREMITIES: No cyanosis, clubbing or edema b/l.    NEUROLOGIC: Cranial nerves II through XII are intact. No focal Motor or sensory deficits b/l.   PSYCHIATRIC: The patient is alert and oriented x 2.  SKIN: No obvious rash, lesion, or ulcer.   LABORATORY PANEL:   CBC Recent Labs  Lab 01/22/18 0451 01/22/18 0659  WBC 9.9  --   HGB 7.9* 8.3*  HCT 24.5* 27.0*  PLT 349  --    ------------------------------------------------------------------------------------------------------------------ Chemistries  Recent Labs  Lab 01/21/18 0606  01/21/18 1316 01/22/18 0108  NA 133*  --  135  --   K 5.3*  --  5.4*  --   CL 100  --  106  --   CO2 22  --  22  --   GLUCOSE 113*  --  98  --   BUN 50*  --  57*  --   CREATININE 4.26*  --  3.90*  --   CALCIUM 8.2*  --  7.7*  --   MG  --    < > 2.8* 2.7*  AST 20  --   --   --  ALT 12  --   --   --   ALKPHOS 148*  --   --   --   BILITOT 0.4  --   --   --    < > = values in this interval not displayed.   ------------------------------------------------------------------------------------------------------------------  Cardiac Enzymes No results for input(s): TROPONINI in the last 168 hours. ------------------------------------------------------------------------------------------------------------------  RADIOLOGY:  No results found.   ASSESSMENT AND PLAN:   84 year old elderly female patient with history of hyperlipidemia, hypertension,  osteoarthritis, macular degeneration, hearing loss presented to the emergency room for hematemesis  -Acute gastrointestinal bleeding Self-limited Stable hemoglobin In view of advanced age endoscopy and colonoscopy deferred per gastroenterology and family Transferred to medical floor as per intensivist  -Speech therapy to recommend diet after swallow study To avoid aspiration  -Hemorrhagic shock resolved   -Acute kidney injury improved with IV fluids IV fluid hydration  -DVT prophylaxis sequential compression device to lower extremities  All the records are reviewed and case discussed with Care Management/Social Worker. Management plans discussed with the patient, family and they are in agreement.  CODE STATUS: DNR  DVT Prophylaxis: SCDs  TOTAL TIME TAKING CARE OF THIS PATIENT: 34 minutes.   POSSIBLE D/C IN 3 to 4 DAYS, DEPENDING ON CLINICAL CONDITION.  [name] M.D on 01/22/2018 at 11:25 AM  Between 7am to 6pm - Pager - [phone]  After 6pm go to [email or web address] - password [email or web address] [facility]  Office  [phone]  CC: Primary care physician; Crecencio Mc, MD  Note: This dictation was prepared with Dragon dictation along with smaller phrase technology. Any transcriptional errors that result from this process are unintentional.

## 2018-01-22 NOTE — Progress Notes (Addendum)
Advanced care plan.  Purpose of the Encounter: CODE STATUS  Parties in Attendance: Patient and patient's daughter in the emergency room  Patient's Decision Capacity: Okay  Subjective/Patient's story: Presented for vomiting of blood Resident of assisted living facility  Objective/Medical story Patient has active GI bleed and anemia Needs PRBC transfusion and gastroenterology work-up if patient can tolerate   Goals of care determination:  Advance care directives goals of care and treatment plan discussed which includes admission to the hospital, IV fluids, PRBC transfusion Patient and family do not want CPR, intubation ventilator if the need arises   CODE STATUS: DNR   Time spent discussing advanced care planning: 16 minutes

## 2018-01-22 NOTE — Progress Notes (Signed)
Patient confused, on room air, and no complaints of pain. SLP evaluation pending. Patient is incontinent. Hemoglobin remains stable 8.3. No bleeding noted. Daughter at bedside. Report given to [facility].

## 2018-01-23 ENCOUNTER — Inpatient Hospital Stay

## 2018-01-23 ENCOUNTER — Inpatient Hospital Stay: Admitting: Anesthesiology

## 2018-01-23 ENCOUNTER — Encounter
Admission: EM | Disposition: A | Discharge status: Hospice | Payer: Self-pay | Source: Home / Self Care | Attending: Internal Medicine

## 2018-01-23 HISTORY — PX: ESOPHAGOGASTRODUODENOSCOPY (EGD) WITH PROPOFOL: SHX5813

## 2018-01-23 LAB — HEMOGLOBIN AND HEMATOCRIT, BLOOD
HCT: 26.6 % — ABNORMAL LOW (ref 36.0–46.0)
HCT: 28.5 % — ABNORMAL LOW (ref 36.0–46.0)
HCT: 16 % — ABNORMAL LOW (ref 36.0–46.0)
Hemoglobin: 8.4 g/dL — ABNORMAL LOW (ref 12.0–15.0)
Hemoglobin: 9.1 g/dL — ABNORMAL LOW (ref 12.0–15.0)
Hemoglobin: 4.8 g/dL — CL (ref 12.0–15.0)

## 2018-01-23 LAB — GLUCOSE, CAPILLARY: Glucose-Capillary: 111 mg/dL — ABNORMAL HIGH (ref 70–99)

## 2018-01-23 SURGERY — ESOPHAGOGASTRODUODENOSCOPY (EGD) WITH PROPOFOL
Anesthesia: General

## 2018-01-23 MED ORDER — PROPOFOL 10 MG/ML IV BOLUS
INTRAVENOUS | Status: AC
  Filled 2018-01-23: qty 20

## 2018-01-23 MED ORDER — SODIUM CHLORIDE 0.9 % IV SOLN: INTRAVENOUS | Status: DC

## 2018-01-23 MED ORDER — SODIUM CHLORIDE 0.9% IV SOLUTION
Freq: Once | INTRAVENOUS | Status: AC
  Administered 2018-01-24: 01:00:00 via INTRAVENOUS

## 2018-01-23 MED ORDER — SODIUM CHLORIDE 0.9 % IV BOLUS
1000.0000 mL | Freq: Once | INTRAVENOUS | Status: AC
  Administered 2018-01-23: 1000 mL via INTRAVENOUS

## 2018-01-23 MED ORDER — SUCCINYLCHOLINE CHLORIDE 20 MG/ML IJ SOLN
INTRAMUSCULAR | Status: AC
  Filled 2018-01-23: qty 1

## 2018-01-23 MED ORDER — EPINEPHRINE PF 1 MG/10ML IJ SOSY
PREFILLED_SYRINGE | INTRAMUSCULAR | Status: DC | PRN
  Administered 2018-01-23: 4 mL via INTRAVENOUS

## 2018-01-23 MED ORDER — SODIUM CHLORIDE 0.9 % IV BOLUS
500.0000 mL | Freq: Once | INTRAVENOUS | Status: AC
  Administered 2018-01-23: 500 mL via INTRAVENOUS

## 2018-01-23 MED ORDER — ONDANSETRON HCL 4 MG/2ML IJ SOLN
INTRAMUSCULAR | Status: AC
  Filled 2018-01-23: qty 2

## 2018-01-23 MED ORDER — FENTANYL CITRATE (PF) 100 MCG/2ML IJ SOLN
INTRAMUSCULAR | Status: AC
  Filled 2018-01-23: qty 2

## 2018-01-23 MED ORDER — EPINEPHRINE PF 1 MG/10ML IJ SOSY
PREFILLED_SYRINGE | INTRAMUSCULAR | Status: AC
  Filled 2018-01-23: qty 10

## 2018-01-23 MED ORDER — SODIUM CHLORIDE 0.9 % IV SOLN
8.0000 mg/h | INTRAVENOUS | Status: DC
  Administered 2018-01-23 – 2018-01-24 (×2): 8 mg/h via INTRAVENOUS
  Filled 2018-01-23 (×2): qty 80

## 2018-01-23 MED ORDER — PROPOFOL 10 MG/ML IV BOLUS
INTRAVENOUS | Status: DC | PRN
  Administered 2018-01-23: 20 mg via INTRAVENOUS
  Administered 2018-01-23: 10 mg via INTRAVENOUS
  Administered 2018-01-23: 30 mg via INTRAVENOUS
  Administered 2018-01-23: 10 mg via INTRAVENOUS
  Administered 2018-01-23: 30 mg via INTRAVENOUS

## 2018-01-23 MED ORDER — HALOPERIDOL LACTATE 5 MG/ML IJ SOLN
1.0000 mg | Freq: Once | INTRAMUSCULAR | Status: AC
  Administered 2018-01-23: 1 mg via INTRAVENOUS
  Filled 2018-01-23: qty 1

## 2018-01-23 MED ORDER — ONDANSETRON HCL 4 MG/2ML IJ SOLN
INTRAMUSCULAR | Status: DC | PRN
  Administered 2018-01-23: 4 mg via INTRAVENOUS

## 2018-01-23 MED ORDER — DEXAMETHASONE SODIUM PHOSPHATE 10 MG/ML IJ SOLN
INTRAMUSCULAR | Status: AC
  Filled 2018-01-23: qty 1

## 2018-01-23 MED ORDER — ROCURONIUM BROMIDE 50 MG/5ML IV SOLN
INTRAVENOUS | Status: AC
  Filled 2018-01-23: qty 1

## 2018-01-23 MED ORDER — PHENYLEPHRINE HCL 10 MG/ML IJ SOLN
INTRAMUSCULAR | Status: DC | PRN
  Administered 2018-01-23 (×2): 100 ug via INTRAVENOUS
  Administered 2018-01-23: 200 ug via INTRAVENOUS
  Administered 2018-01-23: 100 ug via INTRAVENOUS

## 2018-01-23 MED ORDER — DEXAMETHASONE SODIUM PHOSPHATE 4 MG/ML IJ SOLN
INTRAMUSCULAR | Status: DC | PRN
  Administered 2018-01-23: 5 mg via INTRAVENOUS

## 2018-01-23 NOTE — Evaluation (Signed)
Clinical/Bedside Swallow Evaluation Patient Details  Name: Cindy Harvey MRN: [id number] Date of Birth: 1916-10-30  Today's Date: 01/23/2018 Time: SLP Start Time (ACUTE ONLY): 1050 SLP Stop Time (ACUTE ONLY): 1150 SLP Time Calculation (min) (ACUTE ONLY): 60 min  Past Medical History:  Past Medical History:  Diagnosis Date  . Allergy    allergic rhinitis  . Hearing loss   . History of shingles   . Hypercholesterolemia   . Hypertension   . Macular degeneration   . Osteoarthritis    right knee  . Pneumonia 2009   with dehydration and electrolyte imbalance  . Urinary incontinence    Past Surgical History:  Past Surgical History:  Procedure Laterality Date  . TONSILLECTOMY  1928   HPI:  Pt is a 84 y/o female w/ PMH of macular degeneration, Dementia per MD note, OA, pneumonia, hypertension and hearing loss who presented to the emergency department from her nursing home with hematemesis.  This is the second admission to the ED/hospital in November 2019 for Vomiting per chart notes(01/08/2018).  The patient's daughter states that her mother was in her usual state of health approximately 10 PM but by 5 AM reportedly had multiple episodes of coffee-ground emesis.  Hemoglobin was found to have dropped by more than 1 g.  The patient also became hypotensive with systolic pressures between 85 and 90.  The patient received IV pantoprazole and 2 units of packed red blood cells were ordered for transfusion prior to the emergency department staff calling the hospitalist service for admission.  Pt's Daughter denied any swallowing problems baseline stating that her Mother ate a "regular" diet at the [facility].  Pt does present w/ Cognitive decline during engagement but followed through w/ tasks given verbal cues.    Assessment / Plan / Recommendation Clinical Impression  Pt appeared to present w/ adequate oropharyngeal phase swallowing function w/ no overt s/s of aspiration noted during po trials - pt does  have min Cognitive decline (Dementia) as per MD note as well as macular degeneration, hearing loss. Pt does have recent episodes of N/V in November 2019 bringing her to the Hospital  She consumed trials of thin liquids via Cup/Straw and trials of applesauce then graham cracker w/ no coughing or throat clearing noted; no decline in respiratory effort noted during/post trials. Laryngeal excusion appeared [facility]; vocal quality clear post trials. Oral phase appeared [facility] for bolus management and oral clearing; mastication was appropriate for the soft solids. OM exam revealed no unilateral lingual/labial weakness. Pt fed self w/ min assist given. She was easily distracted and required verbal cues to attend and follow through w/ po tasks. Recommend a fairly Regular diet w/ Meats Cut well, moistened w/ Gravy; Thin liquids w/ general aspiratoin precautions. Recommended Pills Whole in puree for safer swallowing. Recommend monitoring during all meals. Recommend Reflux precautions during this time of N/V. ST services will f/u w/ pt's toleration of diet and education w/ Dtr on precautions as needed. NSG updated.  SLP Visit Diagnosis: Dysphagia, unspecified (R13.10)(impacted by Cognitive status)    Aspiration Risk  (reduced following general aspiration precautions)    Diet Recommendation  Regular diet w/ Meats Cut well, moistened w/ Gravy; Thin liquids. General aspiration precautions; reflux precautions d/t recent N/V. Support at meals during the meals  Medication Administration: Whole meds with puree(for safer swallowing)    Other  Recommendations Recommended Consults: (Dietician f/u; palliative care consult as needed) Oral Care Recommendations: Oral care BID;Staff/trained caregiver to provide oral care Other Recommendations: (  n/a)   Follow up Recommendations None      Frequency and Duration min 1 x/week  1 week       Prognosis Prognosis for Safe Diet Advancement: Fair(-Good) Barriers to Reach Goals:  Cognitive deficits      Swallow Study   General Date of Onset: 01/21/18 HPI: Pt is a 84 y/o female w/ PMH of macular degeneration, Dementia per MD note, OA, pneumonia, hypertension and hearing loss who presented to the emergency department from her nursing home with hematemesis.  This is the second admission to the ED/hospital in November 2019 for Vomiting per chart notes(01/08/2018).  The patient's daughter states that her mother was in her usual state of health approximately 10 PM but by 5 AM reportedly had multiple episodes of coffee-ground emesis.  Hemoglobin was found to have dropped by more than 1 g.  The patient also became hypotensive with systolic pressures between 85 and 90.  The patient received IV pantoprazole and 2 units of packed red blood cells were ordered for transfusion prior to the emergency department staff calling the hospitalist service for admission.  Pt's Daughter denied any swallowing problems baseline stating that her Mother ate a "regular" diet at the [facility].  Pt does present w/ Cognitive decline during engagement but followed through w/ tasks given verbal cues.  Type of Study: Bedside Swallow Evaluation Previous Swallow Assessment: none reported Diet Prior to this Study: NPO(regular diet at home per Dtr) Temperature Spikes Noted: No(wbc 9.9) Respiratory Status: Room air History of Recent Intubation: No Behavior/Cognition: Alert;Cooperative;Pleasant mood;Distractible;Requires cueing(baseline Dementia per MD note) Oral Cavity Assessment: Dry Oral Care Completed by SLP: Recent completion by staff Oral Cavity - Dentition: Dentures, top;Dentures, bottom(in place) Vision: Impaired for self-feeding(macular degeneration) Self-Feeding Abilities: Able to feed self;Needs assist;Needs set up;Total assist Patient Positioning: Upright in bed(needed positioning) Baseline Vocal Quality: Normal Volitional Cough: Strong(cued) Volitional Swallow: Able to elicit(cued)     Oral/Motor/Sensory Function Overall Oral Motor/Sensory Function: Within functional limits(no unilateral weakness noted)   Ice Chips Ice chips: Within functional limits Presentation: Spoon(fed; 3 trials)   Thin Liquid Thin Liquid: Within functional limits Presentation: Cup;Self Fed;Straw(assisted; 3-4 trials via each) Other Comments: needed support and monitoring d/t Cognition    Nectar Thick Nectar Thick Liquid: Not tested   Honey Thick Honey Thick Liquid: Not tested   Puree Puree: Within functional limits Presentation: Self Fed;Spoon(assisted; 4 ozs)   Solid     Solid: Within functional limits(mech soft foods) Presentation: Self Fed(assisted; 5 trials) Other Comments: min confusion w/ task      Orinda Kenner, MS, CCC-SLP , 01/23/2018,3:12 PM

## 2018-01-23 NOTE — Progress Notes (Signed)
Pt awake all night and calling out to family members. Has mitts to hands and pulling at IV lines and telemetry leads. Provider notified and 1mg  Haldol given, with no relief of restlessness.  Will continue to monitor.

## 2018-01-23 NOTE — Progress Notes (Signed)
Repeat H&H results hgb 4.8/hct 16.0 no signs of active bleeding at this point will transfuse 3 units pRBC and keep 2 units ahead.  I spoke with Gastroenterologist Dr. Marius Ditch via telephone to inform her of drop in hemoglobin she stated next step would be embolization if pt continues to show signs of bleeding.  Will repeat CBC following blood transfusion.  Vital signs currently stable following 1L NS bolus.  Marda Stalker, [place] Pager [phone] (please enter 7 digits) PCCM Consult Pager [phone] (please enter 7 digits)

## 2018-01-23 NOTE — Progress Notes (Signed)
Visit made. Patient is currently followed by [place] at [facility] with a Hospice diagnosis of Hypertensive Chronic Kidney disease. She is a DNR with out of facility DNR in place. Patient was sent to the [facility] ED on 11/30 for evaluation after she  vomited blood.  Visit made, patient seen lying in bed, eyes daughgter Tootie at bedside and reports that her mother was stable and planning to discharge when she became nauseous and  vomited more blood this morning. GI was reconsulted and plan is for emergent endoscopy this afternoon. Of note patient did receive 2 units of PRBCS over the weekend. Emotional support given. Will continue to follow and update hospice team. Tootie is hopeful that her mother will be able to return to familiar surroundings at [facility].  Flo Shanks RN, BSN, [place] [facility] [place]<hospital Liaison [phone]

## 2018-01-23 NOTE — Consult Note (Signed)
Reason for Consult: Gastrointestinal bleeding Referring Physician: Dr. Herminio Commons Cindy Harvey an 84 y.o. female.  HPI: Cindy Harvey Harvey a very pleasant elderly female with a past medical history remarkable for hypertension, hyperlipidemia, hearing loss, urinary incontinence, osteoarthritis, renal failure who was admitted for hematemesis, initial hemoglobin was 6.2, white count was 12.4, BUN 15 creatinine 4.26.  She subsequently underwent endoscopy today and was found to have bleeding duodenal ulcer/crater.  Status post injection and bipolar cautery.  Being transferred to the intensive care unit for close observation post endoscopy.  She Harvey presently on a Protonix drip.  He Harvey resting comfortably and communicating with stable hemodynamics  Past Medical History:  Diagnosis Date  . Allergy    allergic rhinitis  . Hearing loss   . History of shingles   . Hypercholesterolemia   . Hypertension   . Macular degeneration   . Osteoarthritis    right knee  . Pneumonia 2009   with dehydration and electrolyte imbalance  . Urinary incontinence     Past Surgical History:  Procedure Laterality Date  . TONSILLECTOMY  1928    Family History  Problem Relation Age of Onset  . Stroke Sister   . Heart disease Mother   . Stroke Mother   . Stroke Sister   . Cancer Maternal Aunt        breast CA  . Cancer Cousin        breast cancer  . Stroke Sister     Social History:  reports that she has never smoked. She has never used smokeless tobacco. She reports that she does not drink alcohol or use drugs.  Allergies:  Allergies  Allergen Reactions  . Asa Buff (Mag [Buffered Aspirin] Other (See Comments)    Nose bleeds  . Lipitor [Atorvastatin Calcium] Other (See Comments)    Headaches & dizzy  . Seroquel [Quetiapine Fumarate]   . Zocor [Simvastatin - High Dose] Other (See Comments)    myalgia    Medications: I have reviewed the patient's current medications.  Results for orders placed or  performed during the hospital encounter of 01/21/18 (from the past 48 hour(s))  Hemoglobin and hematocrit, blood     Status: Abnormal   Collection Time: 01/21/18  7:06 PM  Result Value Ref Range   Hemoglobin 9.3 (L) 12.0 - 15.0 g/dL   HCT 28.2 (L) 36.0 - 46.0 %    Comment: Performed at [facility], [address] [place], [place] [address]  Hemoglobin and hematocrit, blood     Status: Abnormal   Collection Time: 01/22/18  1:08 AM  Result Value Ref Range   Hemoglobin 9.3 (L) 12.0 - 15.0 g/dL   HCT 29.0 (L) 36.0 - 46.0 %    Comment: Performed at [facility], [address], [place], [place] [address]  Magnesium     Status: Abnormal   Collection Time: 01/22/18  1:08 AM  Result Value Ref Range   Magnesium 2.7 (H) 1.7 - 2.4 mg/dL    Comment: Performed at [facility], [address] [place], [place] [address]  Phosphorus     Status: None   Collection Time: 01/22/18  1:08 AM  Result Value Ref Range   Phosphorus 3.9 2.5 - 4.6 mg/dL    Comment: Performed at [facility], [address] [place], [place] [address]  CBC with Differential/Platelet     Status: Abnormal   Collection Time: 01/22/18  4:51 AM  Result Value Ref Range  WBC 9.9 4.0 - 10.5 K/uL   RBC 2.69 (L) 3.87 - 5.11 MIL/uL   Hemoglobin 7.9 (L) 12.0 - 15.0 g/dL   HCT 24.5 (L) 36.0 - 46.0 %   MCV 91.1 80.0 - 100.0 fL   MCH 29.4 26.0 - 34.0 pg   MCHC 32.2 30.0 - 36.0 g/dL   RDW 14.4 11.5 - 15.5 %   Platelets 349 150 - 400 K/uL   nRBC 0.0 0.0 - 0.2 %   Neutrophils Relative % 73 %   Neutro Abs 7.2 1.7 - 7.7 K/uL   Lymphocytes Relative 18 %   Lymphs Abs 1.8 0.7 - 4.0 K/uL   Monocytes Relative 7 %   Monocytes Absolute 0.7 0.1 - 1.0 K/uL   Eosinophils Relative 1 %   Eosinophils Absolute 0.1 0.0 - 0.5 K/uL   Basophils Relative 0 %   Basophils Absolute 0.0 0.0 - 0.1 K/uL   Immature Granulocytes 1 %   Abs Immature Granulocytes 0.07 0.00 - 0.07 K/uL    Comment: Performed at [facility], [address] [place], [place] [address]  Hemoglobin and hematocrit, blood     Status: Abnormal   Collection Time: 01/22/18  6:59 AM  Result Value Ref Range   Hemoglobin 8.3 (L) 12.0 - 15.0 g/dL   HCT 27.0 (L) 36.0 - 46.0 %    Comment: Performed at [facility], [address] [place], [place] [address]  Hemoglobin and hematocrit, blood     Status: Abnormal   Collection Time: 01/22/18  1:51 PM  Result Value Ref Range   Hemoglobin 9.1 (L) 12.0 - 15.0 g/dL   HCT 28.8 (L) 36.0 - 46.0 %    Comment: Performed at [facility], [address] [place], [place] [address]  Hemoglobin and hematocrit, blood     Status: Abnormal   Collection Time: 01/22/18  6:55 PM  Result Value Ref Range   Hemoglobin 9.2 (L) 12.0 - 15.0 g/dL   HCT 29.9 (L) 36.0 - 46.0 %    Comment: Performed at [facility], [address] [place], [place] [address]  Hemoglobin and hematocrit, blood     Status: Abnormal   Collection Time: 01/23/18  1:06 AM  Result Value Ref Range   Hemoglobin 8.4 (L) 12.0 - 15.0 g/dL   HCT 26.6 (L) 36.0 - 46.0 %    Comment: Performed at [facility], [address] [place], [place] [address]  Hemoglobin and hematocrit, blood     Status: Abnormal   Collection Time: 01/23/18  6:44 AM  Result Value Ref Range   Hemoglobin 9.1 (L) 12.0 - 15.0 g/dL   HCT 28.5 (L) 36.0 - 46.0 %    Comment: Performed at [facility], [address] [place], [place] [address]    Dg Chest Port 1 View  Result Date: 01/23/2018 CLINICAL DATA:  Aspiration pneumonia. EXAM: PORTABLE CHEST 1 VIEW COMPARISON:  02/23/2016 FINDINGS: The heart Harvey upper limits of normal in size given the AP projection portable technique. There Harvey moderate tortuosity and calcification of the thoracic aorta. Stable emphysematous changes and bibasilar pulmonary scarring. No definite infiltrates or effusions. Skin fold noted over the left hemithorax. The bony thorax Harvey intact.  IMPRESSION: Chronic emphysematous changes and pulmonary scarring. No definite acute overlying pulmonary process. Electronically Signed   By: Marijo Sanes M.D.   On: 01/23/2018 15:27    ROS  See HPI.  Limited review of systems as patient Harvey sleepy post  procedure  Blood pressure (!) 155/67, pulse 90, temperature 97.8 F (36.6 C), temperature source Oral, resp. rate (!) 21, height 5\' 1"  (1.549 m), weight 48.9 kg, SpO2 97 %. Physical Exam  Vital signs: Please see the above listed vital signs HEENT: Trachea midline, no oral lesions appreciated, no accessory muscle utilization, neck veins flat Cardiovascular: Regular rate and rhythm Pulmonary: Clear to auscultation Abdominal: Positive bowel sounds, soft exam Extremities: No clubbing, cyanosis or edema noted Neurologic: Patient Harvey hearing impaired, awake and communicating.  Moves all extremities without any clear focal deficits  Assessment/Plan:  Elderly female with past medical history of hypertension, hyperlipidemia, osteoarthritis, macular degeneration, hearing impaired with acute upper gastrointestinal bleeding.  Gastrointestinal bleeding.  Status post upper endoscopy with bleeding duodenal vessel and crater, status post injection and bipolar.  On Protonix infusion, being transferred to the intensive care unit for close monitoring follow-up hemoglobin  Chronic kidney disease.  BUN Harvey 57 and creatinine 3.9.  Potassium was 5.3, will recheck BMP on next blood draw   Hermelinda Dellen, DO   Cindy Harvey 01/23/2018, 5:07 PM

## 2018-01-23 NOTE — Anesthesia Procedure Notes (Signed)
Procedure Name: Intubation Date/Time: 01/23/2018 2:05 PM Performed by: Jonna Clark, CRNA Pre-anesthesia Checklist: Patient identified, Patient being monitored, Timeout performed, Emergency Drugs available and Suction available Patient Re-evaluated:Patient Re-evaluated prior to induction Oxygen Delivery Method: Circle system utilized Preoxygenation: Pre-oxygenation with 100% oxygen Induction Type: IV induction, Cricoid Pressure applied and Rapid sequence Laryngoscope Size: Mac and 3 Grade View: Grade I Tube type: Oral Tube size: 6.5 mm Number of attempts: 1 Placement Confirmation: ETT inserted through vocal cords under direct vision,  positive ETCO2 and breath sounds checked- equal and bilateral Secured at: 21 cm Tube secured with: Tape Dental Injury: Teeth and Oropharynx as per pre-operative assessment

## 2018-01-23 NOTE — Anesthesia Preprocedure Evaluation (Addendum)
Anesthesia Evaluation  Patient identified by MRN, date of birth, ID band Patient awake    Reviewed: Allergy & Precautions, H&P , NPO status , Patient's Chart, lab work & pertinent test results  Airway Mallampati: II       Dental  (+) Edentulous Upper, Edentulous Lower   Pulmonary neg pulmonary ROS,           Cardiovascular hypertension,   Last echo 2013: mild aortic stenosis only   Neuro/Psych PSYCHIATRIC DISORDERS Anxiety Depression Schizophrenia Dementia CVA negative neurological ROS  negative psych ROS   GI/Hepatic negative GI ROS, Neg liver ROS,   Endo/Other  negative endocrine ROS  Renal/GU ESRFRenal disease     Musculoskeletal  (+) Arthritis ,   Abdominal   Peds  Hematology negative hematology ROS (+)   Anesthesia Other Findings 84 yo with large volume hematemesis.  Not NPO appropriate (ate apple sauce 3 hrs ago).  Dr. Alice Reichert has declared the case an emergency.  Past Medical History: No date: Allergy     Comment:  allergic rhinitis No date: Hearing loss No date: History of shingles No date: Hypercholesterolemia No date: Hypertension No date: Macular degeneration No date: Osteoarthritis     Comment:  right knee 2009: Pneumonia     Comment:  with dehydration and electrolyte imbalance No date: Urinary incontinence  Past Surgical History: 1928: TONSILLECTOMY  BMI    Body Mass Index:  20.39 kg/m      Reproductive/Obstetrics negative OB ROS                            Anesthesia Physical Anesthesia Plan  ASA: IV and emergent  Anesthesia Plan: General ETT   Post-op Pain Management:    Induction: Intravenous, Cricoid pressure planned and Rapid sequence  PONV Risk Score and Plan: Ondansetron  Airway Management Planned: Oral ETT  Additional Equipment:   Intra-op Plan:   Post-operative Plan:   Informed Consent: I have reviewed the patients History and Physical,  chart, labs and discussed the procedure including the risks, benefits and alternatives for the proposed anesthesia with the patient or authorized representative who has indicated his/her understanding and acceptance.   Dental Advisory Given  Plan Discussed with: Anesthesiologist  Anesthesia Plan Comments:        Anesthesia Quick Evaluation

## 2018-01-23 NOTE — Progress Notes (Signed)
Initial Nutrition Assessment  DOCUMENTATION CODES:   Not applicable  INTERVENTION:  Once diet advanced again, provide Ensure Enlive po BID, each supplement provides 350 kcal and 20 grams of protein.  Also provide daily MVI.  NUTRITION DIAGNOSIS:   Inadequate oral intake related to inability to eat as evidenced by NPO status.  GOAL:   Patient will meet greater than or equal to 90% of their needs  MONITOR:   Diet advancement, Labs, Weight trends, TF tolerance, Skin, I & O's  REASON FOR ASSESSMENT:   Malnutrition Screening Tool    ASSESSMENT:   84 year old female with PMHx of HTN, hearing loss, OA, macular degeneration who is admitted with acute GI bleed.   -Patient was approved for regular texture diet (with cut meats) and thin liquids per SLP today. -Was then made NPO again to be taken to endoscopy.  Unable to meet with patient today as she had been taken to endoscopy. No family members present in room. Patient is known to this RD from an admission last year. At that time she was a poor historian and unable to provide any nutrition or weight history. Weight still appears to be stable per chart. She is 48.9 kg today (107.9 lbs). Unable to determine if patient meets criteria for malnutrition at this time.  Medications reviewed and include: Colace, NS @ 100 mL/hr, Protonix IV.  Labs reviewed: CBG 96, Potassium 5.4, BUN 57, Creatinine 3.9, Magnesium 2.7.  NUTRITION - FOCUSED PHYSICAL EXAM:  Unable to obtain as patient not in room.  Diet Order:   Diet Order            Diet NPO time specified  Diet effective now             EDUCATION NEEDS:   No education needs have been identified at this time  Skin:  Skin Assessment: Reviewed RN Assessment(ecchymosis)  Last BM:  01/22/2018 per chart  Height:   Ht Readings from Last 1 Encounters:  01/22/18 5\' 1"  (1.549 m)   Weight:   Wt Readings from Last 1 Encounters:  01/23/18 48.9 kg   Ideal Body Weight:  47.7  kg  BMI:  Body mass index is 20.39 kg/m.  Estimated Nutritional Needs:   Kcal:  1200-1400  Protein:  60-70 grams  Fluid:  1.2-1.4 L/day  Willey Blade, MS, RD, LDN Office: [phone] Pager: [phone] After Hours/Weekend Pager: [phone]

## 2018-01-23 NOTE — Anesthesia Post-op Follow-up Note (Signed)
Anesthesia QCDR form completed.        

## 2018-01-23 NOTE — Transfer of Care (Signed)
Immediate Anesthesia Transfer of Care Note  Patient: Cindy Harvey  Procedure(s) Performed: ESOPHAGOGASTRODUODENOSCOPY (EGD) WITH PROPOFOL (N/A )  Patient Location: PACU  Anesthesia Type:General  Level of Consciousness: awake and alert   Airway & Oxygen Therapy: Patient Spontanous Breathing  Post-op Assessment: Report given to RN and Post -op Vital signs reviewed and stable  Post vital signs: Reviewed and stable  Last Vitals:  Vitals Value Taken Time  BP 121/58 01/23/2018  3:04 PM  Temp 36.3 C 01/23/2018  2:58 PM  Pulse 91 01/23/2018  3:06 PM  Resp 21 01/23/2018  3:06 PM  SpO2 100 % 01/23/2018  3:06 PM  Vitals shown include unvalidated device data.  Last Pain:  Vitals:   01/23/18 0541  TempSrc: Oral  PainSc:          Complications: No apparent anesthesia complications

## 2018-01-23 NOTE — Progress Notes (Addendum)
[facility] [place] NAME: Claressa Hughley Toppins    MR#:  [id number]  DATE OF BIRTH:  June 10, 1916  SUBJECTIVE:  CHIEF COMPLAINT:   Chief Complaint  Patient presents with  . GI Problem  . Hematemesis  Patient seen and evaluated this morning by me Speech therapy evaluated the patient and patient tried some applesauce She vomited blood Appears fatigued and tired  REVIEW OF SYSTEMS:    ROS  CONSTITUTIONAL: No documented fever. Has fatigue, weakness. No weight gain, no weight loss.  EYES: No blurry or double vision.  ENT: No tinnitus. No postnasal drip. No redness of the oropharynx.  RESPIRATORY: No cough, no wheeze, no hemoptysis. No dyspnea.  CARDIOVASCULAR: No chest pain. No orthopnea. No palpitations. No syncope.  GASTROINTESTINAL: Has nausea, has vomiting , no diarrhea. No abdominal pain.  Had an episode of vomiting blood  GENITOURINARY: No dysuria or hematuria.  ENDOCRINE: No polyuria or nocturia. No heat or cold intolerance.  HEMATOLOGY: No anemia. No bruising. No bleeding.  INTEGUMENTARY: No rashes. No lesions.  MUSCULOSKELETAL: No arthritis. No swelling. No gout.  NEUROLOGIC: No numbness, tingling, or ataxia. No seizure-type activity.  PSYCHIATRIC: No anxiety. No insomnia. No ADD.   DRUG ALLERGIES:   Allergies  Allergen Reactions  . Asa Buff (Mag [Buffered Aspirin] Other (See Comments)    Nose bleeds  . Lipitor [Atorvastatin Calcium] Other (See Comments)    Headaches & dizzy  . Seroquel [Quetiapine Fumarate]   . Zocor [Simvastatin - High Dose] Other (See Comments)    myalgia    VITALS:  Blood pressure (!) 149/53, pulse 89, temperature 98.2 F (36.8 C), temperature source Oral, resp. rate 20, height 5\' 1"  (1.549 m), weight 48.9 kg, SpO2 97 %.  PHYSICAL EXAMINATION:   Physical Exam  GENERAL:  84 y.o.-year-old patient lying in the bed with no acute distress.  EYES: Pupils equal, round, reactive to light and accommodation. No  scleral icterus. Extraocular muscles intact.  HEENT: Head atraumatic, normocephalic. Oropharynx moist and nasopharynx clear.  NECK:  Supple, no jugular venous distention. No thyroid enlargement, no tenderness.  LUNGS: Normal breath sounds bilaterally, no wheezing, rales, rhonchi. No use of accessory muscles of respiration.  CARDIOVASCULAR: S1, S2 normal. No murmurs, rubs, or gallops.  ABDOMEN: Soft, nontender, nondistended. Bowel sounds present. No organomegaly or mass.  EXTREMITIES: No cyanosis, clubbing or edema b/l.    NEUROLOGIC: Cranial nerves II through XII are intact. No focal Motor or sensory deficits b/l.   PSYCHIATRIC: The patient is alert and oriented x 2.  SKIN: No obvious rash, lesion, or ulcer.   LABORATORY PANEL:   CBC Recent Labs  Lab 01/22/18 0451  01/23/18 0644  WBC 9.9  --   --   HGB 7.9*   < > 9.1*  HCT 24.5*   < > 28.5*  PLT 349  --   --    < > = values in this interval not displayed.   ------------------------------------------------------------------------------------------------------------------ Chemistries  Recent Labs  Lab 01/21/18 0606  01/21/18 1316 01/22/18 0108  NA 133*  --  135  --   K 5.3*  --  5.4*  --   CL 100  --  106  --   CO2 22  --  22  --   GLUCOSE 113*  --  98  --   BUN 50*  --  57*  --   CREATININE 4.26*  --  3.90*  --   CALCIUM 8.2*  --  7.7*  --   MG  --    < > 2.8* 2.7*  AST 20  --   --   --   ALT 12  --   --   --   ALKPHOS 148*  --   --   --   BILITOT 0.4  --   --   --    < > = values in this interval not displayed.   ------------------------------------------------------------------------------------------------------------------  Cardiac Enzymes No results for input(s): TROPONINI in the last 168 hours. ------------------------------------------------------------------------------------------------------------------  RADIOLOGY:  No results found.   ASSESSMENT AND PLAN:   84 year old elderly female patient with  history of hyperlipidemia, hypertension, osteoarthritis, macular degeneration, hearing loss presented to the emergency room for hematemesis  -Acute gastrointestinal bleeding Had an episode of bleeding today GI follow-up for endoscopy Discussed with patient's family they want GI evaluation and possible endoscopy IV Protonix drip IV fluids Monitor hemoglobin hematocrit  -N.p.o. for now Chest x-ray to check for any aspiration  -Dehydration IV fluids  -Advanced age high risk of morbidity and mortality Discussed with patient's family in detail  -DVT prophylaxis sequential compression device to lower extremities  All the records are reviewed and case discussed with Care Management/Social Worker. Management plans discussed with the patient, family and they are in agreement.  CODE STATUS: DNR  DVT Prophylaxis: SCDs  TOTAL TIME TAKING CARE OF THIS PATIENT: 36 minutes.   POSSIBLE D/C IN  2 to 3 DAYS, DEPENDING ON CLINICAL CONDITION.  [name] M.D on 01/23/2018 at 12:36 PM  Between 7am to 6pm - Pager - [phone]  After 6pm go to [email or web address] - password [email or web address] [facility]  Office  [phone]  CC: Primary care physician; Crecencio Mc, MD  Patient had emergent upper endoscopy Bleeding duodenal ulcer noted Patient will be transferred to step down unit for close monitoring  Note: This dictation was prepared with Dragon dictation along with smaller phrase technology. Any transcriptional errors that result from this process are unintentional.

## 2018-01-23 NOTE — Op Note (Signed)
[facility] Patient Name: Amori Cooperman Veracruz Procedure Date: 01/23/2018 1:55 PM MRN: [id number] Account #: [id number] Date of Birth: 03/31/1916 Admit Type: Inpatient Age: 84 Room: [facility] ENDO ROOM 4 Gender: Female Note Status: Finalized Procedure:            Upper GI endoscopy Indications:          Hematemesis, Melena Providers:            Benay Pike. Alice Reichert MD, MD Referring MD:         Deborra Medina, MD (Referring MD) Medicines:            General Anesthesia Complications:        No immediate complications. Estimated blood loss:                        Minimal. Procedure:            Pre-Anesthesia Assessment:                       - The risks and benefits of the procedure and the                        sedation options and risks were discussed with the                        patient. All questions were answered and informed                        consent was obtained.                       - Patient identification and proposed procedure were                        verified prior to the procedure by the nurse. The                        procedure was verified in the procedure room.                       - ASA Grade Assessment: III - A patient with severe                        systemic disease.                       - After reviewing the risks and benefits, the patient                        was deemed in satisfactory condition to undergo the                        procedure.                       After obtaining informed consent, the endoscope was                        passed under direct vision. Throughout the procedure,                        the  patient's blood pressure, pulse, and oxygen                        saturations were monitored continuously. The Endoscope                        was introduced through the mouth, and advanced to the                        third part of duodenum. The upper GI endoscopy was                        somewhat  difficult due to excessive bleeding.                        Successful completion of the procedure was aided by                        controlling the bleeding and lavage. The patient                        tolerated the procedure well. Findings:      A non-obstructing Schatzki ring was found in the lower third of the       esophagus.      Clotted blood was found in the gastric fundus. Lavage of the area was       performed using a moderate amount of sterile water, resulting in       incomplete clearance with fair visualization.      One non-obstructing spurting cratered duodenal ulcer with adherent clot       was found in the first portion of the duodenum. The lesion was 10 mm in       largest dimension. There is no evidence of perforation. Area was       unsuccessfully injected with 4 mL of a 1:10,000 solution of epinephrine       for hemostasis. Coagulation for hemostasis using bipolar probe was       unsuccessful. The scope was withdrawn and replaced with the therapeutic       endoscope because of bleeding and in order to treat bleeding.       Coagulation for hemostasis using bipolar probe was successful. Impression:           - Non-obstructing Schatzki ring.                       - Clotted blood in the gastric fundus.                       - One non-obstructing spurting duodenal ulcer with                        adherent clot. There is no evidence of perforation.                        Treatment not successful. Treatment not successful.                        Treated with bipolar cautery.                       - No  specimens collected. Recommendation:       - Return patient to ICU for ongoing care.                       - NPO today.                       - Continue present medications.                       - The findings and recommendations were discussed with                        the patient's family. Procedure Code(s):    --- Professional ---                       [phone],  Esophagogastroduodenoscopy, flexible, transoral;                        with control of bleeding, any method Diagnosis Code(s):    --- Professional ---                       K92.1, Melena (includes Hematochezia)                       K92.0, Hematemesis                       K26.4, Chronic or unspecified duodenal ulcer with                        hemorrhage                       K92.2, Gastrointestinal hemorrhage, unspecified                       K22.2, Esophageal obstruction CPT copyright 2018 American Medical Association. All rights reserved. The codes documented in this report are preliminary and upon coder review [facility]  be revised to meet current compliance requirements. Efrain Sella MD, MD 01/23/2018 2:43:25 PM This report has been signed electronically. Number of Addenda: 0 Note Initiated On: 01/23/2018 1:55 PM      [facility]

## 2018-01-23 NOTE — Progress Notes (Signed)
[facility] Inpatient Progress Note  Subjective: Patient seen for f/u UGI bleed. CTSP by Dr. Estanislado Pandy for recurrent frank hemetemesis with melena. Patient in NAD currently, but daughter would like for [place] to perform endoscopy after declining procedure two days ago.   Objective: Vital signs in last 24 hours: Temp:  [97.5 F (36.4 C)-98.2 F (36.8 C)] 98.2 F (36.8 C) (12/02 0541) Pulse Rate:  [82-89] 89 (12/02 0541) Resp:  [20] 20 (12/01 1255) BP: (149-151)/(48-53) 149/53 (12/02 0541) SpO2:  [97 %-98 %] 97 % (12/02 0541) Weight:  [47.5 kg-48.9 kg] 48.9 kg (12/02 0500) Blood pressure (!) 149/53, pulse 89, temperature 98.2 F (36.8 C), temperature source Oral, resp. rate 20, height 5\' 1"  (1.549 m), weight 48.9 kg, SpO2 97 %.    Intake/Output from previous day: No intake/output data recorded.  Intake/Output this shift: No intake/output data recorded.   General appearance:  Alert, NAD Resp:  CTA Cardio:RRR GI:  Soft, bs+ Extremities:  No edema.   Lab Results: Results for orders placed or performed during the hospital encounter of 01/21/18 (from the past 24 hour(s))  Hemoglobin and hematocrit, blood     Status: Abnormal   Collection Time: 01/22/18  1:51 PM  Result Value Ref Range   Hemoglobin 9.1 (L) 12.0 - 15.0 g/dL   HCT 28.8 (L) 36.0 - 46.0 %  Hemoglobin and hematocrit, blood     Status: Abnormal   Collection Time: 01/22/18  6:55 PM  Result Value Ref Range   Hemoglobin 9.2 (L) 12.0 - 15.0 g/dL   HCT 29.9 (L) 36.0 - 46.0 %  Hemoglobin and hematocrit, blood     Status: Abnormal   Collection Time: 01/23/18  1:06 AM  Result Value Ref Range   Hemoglobin 8.4 (L) 12.0 - 15.0 g/dL   HCT 26.6 (L) 36.0 - 46.0 %  Hemoglobin and hematocrit, blood     Status: Abnormal   Collection Time: 01/23/18  6:44 AM  Result Value Ref Range   Hemoglobin 9.1 (L) 12.0 - 15.0 g/dL   HCT 28.5 (L) 36.0 - 46.0 %     Recent Labs    01/21/18 0606  01/22/18 0451   01/22/18 1855 01/23/18 0106 01/23/18 0644  WBC 12.4*  --  9.9  --   --   --   --   HGB 6.2*   < > 7.9*   < > 9.2* 8.4* 9.1*  HCT 20.5*   < > 24.5*   < > 29.9* 26.6* 28.5*  PLT 470*  --  349  --   --   --   --    < > = values in this interval not displayed.   BMET Recent Labs    01/21/18 0606 01/21/18 1316  NA 133* 135  K 5.3* 5.4*  CL 100 106  CO2 22 22  GLUCOSE 113* 98  BUN 50* 57*  CREATININE 4.26* 3.90*  CALCIUM 8.2* 7.7*   LFT Recent Labs    01/21/18 0606  PROT 6.0*  ALBUMIN 3.0*  AST 20  ALT 12  ALKPHOS 148*  BILITOT 0.4   PT/INR Recent Labs    01/21/18 0606  LABPROT 13.7  INR 1.06   Hepatitis Panel No results for input(s): HEPBSAG, HCVAB, HEPAIGM, HEPBIGM in the last 72 hours. C-Diff No results for input(s): CDIFFTOX in the last 72 hours. No results for input(s): CDIFFPCR in the last 72 hours.   Studies/Results: No results found.  Scheduled Inpatient Medications:   . docusate sodium  100 mg Oral BID    Continuous Inpatient Infusions:   . sodium chloride    . sodium chloride 100 mL/hr at 01/23/18 0457  . pantoprozole (PROTONIX) infusion 8 mg/hr (01/23/18 1230)    PRN Inpatient Medications:  LORazepam    Assessment:  1. Melena 2. UGI bleed. 3. Anemia - Hgb 9.1, increased.  Plan:  EGD. The patient's daughter understands the nature of the planned procedure, indications, risks, alternatives and potential complications including but not limited to bleeding, infection, perforation, damage to internal organs and possible oversedation/side effects from anesthesia. The patient's daughter agrees and gives consent to proceed.  Please refer to procedure notes for findings, recommendations and patient disposition/instructions.  Teodoro K. Alice Reichert, M.D. 01/23/2018, 12:53 PM

## 2018-01-24 LAB — TYPE AND SCREEN
ABO/RH(D): A NEG
Antibody Screen: NEGATIVE
UNIT DIVISION: 0
Unit division: 0
Unit division: 0
Unit division: 0
Unit division: 0

## 2018-01-24 LAB — BPAM RBC
Blood Product Expiration Date: 201912232359
Blood Product Expiration Date: 201912232359
Blood Product Expiration Date: 201912232359
Blood Product Expiration Date: 201912232359
Blood Product Expiration Date: 201912232359
ISSUE DATE / TIME: 201911300654
ISSUE DATE / TIME: 201911300654
Unit Type and Rh: 5100
Unit Type and Rh: 5100
Unit Type and Rh: 600
Unit Type and Rh: 600
Unit Type and Rh: 600

## 2018-01-24 LAB — BPAM FFP
BLOOD PRODUCT EXPIRATION DATE: 201912082359
Blood Product Expiration Date: 201912082359
Unit Type and Rh: 600
Unit Type and Rh: 600

## 2018-01-24 LAB — PREPARE FRESH FROZEN PLASMA
Unit division: 0
Unit division: 0

## 2018-01-24 LAB — PREPARE RBC (CROSSMATCH)

## 2018-01-24 MED ORDER — SODIUM CHLORIDE 0.9% IV SOLUTION: Freq: Once | INTRAVENOUS | Status: DC

## 2018-01-24 MED ORDER — PHENYLEPHRINE HCL-NACL 10-0.9 MG/250ML-% IV SOLN
0.0000 ug/min | INTRAVENOUS | Status: DC
  Filled 2018-01-24 (×2): qty 250

## 2018-01-24 MED ORDER — SODIUM CHLORIDE 0.9% IV SOLUTION
Freq: Once | INTRAVENOUS | Status: AC
  Administered 2018-01-24: 02:00:00 via INTRAVENOUS

## 2018-01-24 MED ORDER — GLYCOPYRROLATE 0.2 MG/ML IJ SOLN
0.2000 mg | INTRAMUSCULAR | Status: DC | PRN
  Filled 2018-01-24: qty 1

## 2018-01-24 MED ORDER — LORAZEPAM 2 MG/ML IJ SOLN: 0.2500 mg | Freq: Four times a day (QID) | INTRAMUSCULAR | Status: DC | PRN

## 2018-01-24 MED ORDER — MORPHINE SULFATE (PF) 2 MG/ML IV SOLN
1.0000 mg | INTRAVENOUS | Status: DC | PRN
  Administered 2018-01-24 (×2): 1 mg via INTRAVENOUS
  Filled 2018-01-24 (×2): qty 1

## 2018-01-24 NOTE — Progress Notes (Signed)
   01/24/18 7998  Clinical Encounter Type  Visited With Patient and family together  Visit Type Follow-up  Recommendations continue to follow  [place] followed up with patient's daughter at bedside.  Tootie shared observations of last few hours and re-engaged life review of patient's time at facility.  Tootie spoke of planning she has already done for patient's care after death, funeral home, scripture readings.  She says that planning ahead helps her, always has.  Chaplain expressed that she will continue to hold patient and family in prayer and that she will ask incoming chaplain to follow up with family as well.

## 2018-01-24 NOTE — Progress Notes (Signed)
Pts daughter and son-in-law arrived at pts bedside pt continues to actively bleed per rectum and now vomiting bright red blood.  After further discussion with pts family they have decided they do not want the pt to receive anymore blood products or undergo any additional procedures.  They would like to transition pt to comfort measures only, therefore orders placed and chaplain paged.  Will continue to monitor pt.  Marda Stalker, [place] Pager [phone] (please enter 7 digits) PCCM Consult Pager [phone] (please enter 7 digits)

## 2018-01-24 NOTE — Anesthesia Postprocedure Evaluation (Signed)
Anesthesia Post Note  Patient: Cindy Harvey  Procedure(s) Performed: ESOPHAGOGASTRODUODENOSCOPY (EGD) WITH PROPOFOL (N/A )  Patient location during evaluation: PACU Anesthesia Type: General Level of consciousness: awake and alert Pain management: pain level controlled Vital Signs Assessment: post-procedure vital signs reviewed and stable Respiratory status: spontaneous breathing, nonlabored ventilation, respiratory function stable and patient connected to nasal cannula oxygen Cardiovascular status: blood pressure returned to baseline and stable Postop Assessment: no apparent nausea or vomiting Anesthetic complications: no     Last Vitals:  Vitals:   01/24/18 0256 01/24/18 0300  BP:  100/62  Pulse:  (!) 109  Resp: 20 (!) 26  Temp:  (!) 36.2 C  SpO2:  100%    Last Pain:  Vitals:   01/24/18 0300  TempSrc: Axillary  PainSc:                  Durenda Hurt

## 2018-01-24 NOTE — Progress Notes (Signed)
At 2200, patient became hypotensive and received one liter bolus, with slight improvement in blood pressure. No sign of active bleeding was seen. Critical hgb value was called to this nurse, and NP was made aware. 3 units of pRBC was order for patient. Around 2 am during this nurse assessment and upon patient receiving blood, patient complained of abdominal pain and tenderness. Patient became hypotensive again and started to have active bleeding with bloody stools. Rapid transfusion of blood was given and family notified. Upon receiving the third unit of pRBC, patient's daughter decide to transition patient to comfort care. Total of 3 units of pRBC given during this nurse shift. Patient resting in bed and continues to have bloody stools. Nurse will continue to provide compassionate care for patient and patient's family members.

## 2018-01-24 NOTE — Progress Notes (Signed)
Nutrition Brief Follow-Up Note  Chart reviewed. Patient now transitioning to comfort care.   No further nutrition interventions warranted at this time. Please consult RD as needed.   Vonette Grosso, MS, RD, LDN Office: [phone] Pager: [phone] After Hours/Weekend Pager: [phone]    

## 2018-01-24 NOTE — Progress Notes (Signed)
Visit made. Chart notes reviewed, patient began bleeding again overnight and received further transfusions, patient continued to bleed and have hypotension, when family arrived they chose to focus on comfort, with no further interventions/transfusions. Patient made comfort care and will be moved out of the ICU. Hospice team updated. Daughter Tootie at bedside, emotional support given, she relayed her decision for comfort care and appears at peace with it. Patient appeared to be resting comfortable, no s/s of pain or dyspnea. Remains on room air. Tootie will remains at bedside, Probation officer to provide support through out the day. Flo Shanks RN, BSN, [facility] [place], hospital Liaison [phone]

## 2018-01-24 NOTE — Progress Notes (Signed)
Patient ID: Cindy Harvey, female   DOB: 01-Nebergall-1918, 84 y.o.   MRN: [id number] Pulmonary/critical care attending  Events of last evening noted. Patient with aggressive bleeding Goals of care discussed with patient's daughter.  They have transition to comfort care. This morning when I saw patient she is resting comfortably in no acute distress and daughter was happy.  She has as needed pain and sedative medicines ordered. Appreciate chaplain services We will send to 1C today.  Hermelinda Dellen, DO

## 2018-01-24 NOTE — Progress Notes (Signed)
[facility] [place] NAME: Kennette Cuthrell Cogle    MR#:  [id number]  DATE OF BIRTH:  [birth year]  SUBJECTIVE:  CHIEF COMPLAINT:   Chief Complaint  Patient presents with  . GI Problem  . Hematemesis  Patient seen and evaluated this morning  Overnight had active episodes of GI bleed Very lethargic Hypotensive Patient has been made comfort care by family members Currently under comfort measures only  REVIEW OF SYSTEMS:    ROS  Could not be obtained secondary to lethargy and confusion  DRUG ALLERGIES:   Allergies  Allergen Reactions  . Asa Buff (Mag [Buffered Aspirin] Other (See Comments)    Nose bleeds  . Lipitor [Atorvastatin Calcium] Other (See Comments)    Headaches & dizzy  . Seroquel [Quetiapine Fumarate]   . Zocor [Simvastatin - High Dose] Other (See Comments)    myalgia    VITALS:  Blood pressure (!) 140/110, pulse 95, temperature 98.3 F (36.8 C), temperature source Oral, resp. rate 20, height 5\' 1"  (1.549 m), weight 48.9 kg, SpO2 (!) 78 %.  PHYSICAL EXAMINATION:   Physical Exam  GENERAL:  84 y.o.-year-old patient lying in the bed  EYES: Pupils equal, round, reactive to light and accommodation. No scleral icterus. Extraocular muscles intact.  HEENT: Head atraumatic, normocephalic. Oropharynx dry and nasopharynx clear.  NECK:  Supple, no jugular venous distention. No thyroid enlargement, no tenderness.  LUNGS: Normal breath sounds bilaterally, no wheezing, rales, rhonchi. No use of accessory muscles of respiration.  CARDIOVASCULAR: S1, S2 tachycardia noted. No murmurs, rubs, or gallops.  ABDOMEN: Soft, nontender, nondistended. Bowel sounds present. No organomegaly or mass.  EXTREMITIES: No cyanosis, clubbing or edema b/l.    NEUROLOGIC: Not oriented to time place and person Moves extremities PSYCHIATRIC: The patient is alert and oriented x none SKIN: No obvious rash, lesion, or ulcer.   LABORATORY PANEL:   CBC Recent  Labs  Lab 01/22/18 0451  01/23/18 2221  WBC 9.9  --   --   HGB 7.9*   < > 4.8*  HCT 24.5*   < > 16.0*  PLT 349  --   --    < > = values in this interval not displayed.   ------------------------------------------------------------------------------------------------------------------ Chemistries  Recent Labs  Lab 01/21/18 0606  01/21/18 1316 01/22/18 0108  NA 133*  --  135  --   K 5.3*  --  5.4*  --   CL 100  --  106  --   CO2 22  --  22  --   GLUCOSE 113*  --  98  --   BUN 50*  --  57*  --   CREATININE 4.26*  --  3.90*  --   CALCIUM 8.2*  --  7.7*  --   MG  --    < > 2.8* 2.7*  AST 20  --   --   --   ALT 12  --   --   --   ALKPHOS 148*  --   --   --   BILITOT 0.4  --   --   --    < > = values in this interval not displayed.   ------------------------------------------------------------------------------------------------------------------  Cardiac Enzymes No results for input(s): TROPONINI in the last 168 hours. ------------------------------------------------------------------------------------------------------------------  RADIOLOGY:  Dg Chest Port 1 View  Result Date: 01/23/2018 CLINICAL DATA:  Aspiration pneumonia. EXAM: PORTABLE CHEST 1 VIEW COMPARISON:  02/23/2016 FINDINGS: The heart is upper limits of normal  in size given the AP projection portable technique. There is moderate tortuosity and calcification of the thoracic aorta. Stable emphysematous changes and bibasilar pulmonary scarring. No definite infiltrates or effusions. Skin fold noted over the left hemithorax. The bony thorax is intact. IMPRESSION: Chronic emphysematous changes and pulmonary scarring. No definite acute overlying pulmonary process. Electronically Signed   By: Marijo Sanes M.D.   On: 01/23/2018 15:27     ASSESSMENT AND PLAN:   84 year old elderly female patient with history of hyperlipidemia, hypertension, osteoarthritis, macular degeneration, hearing loss presented to the emergency room  for hematemesis  -Acute aggressive gastrointestinal bleeding Status post upper endoscopy Very poor prognosis Has bleeding duodenal ulcer Family does not want any more aggressive measures Comfort measures instituted  -Hypotension with shock secondary to massive GI bleed Comfort measures only Family does not want any IV pressor medication IV fluid resuscitation and blood transfusions  -Hypoxia Secondary to severe anemia Oxygen as needed via nasal cannula  -Renal failure Supportive care  -Advanced dementia Supportive care  -Poor prognosis Patient under comfort care  All the records are reviewed and case discussed with Care Management/Social Worker. Management plans discussed with the patient, family and they are in agreement.  CODE STATUS: DNR  TOTAL TIME TAKING CARE OF THIS PATIENT: 36 minutes.   POSSIBLE D/C IN  1 DAYS, DEPENDING ON CLINICAL CONDITION.  [name] M.D on 01/24/2018 at 11:28 AM  Between 7am to 6pm - Pager - [phone]  After 6pm go to [email or web address] - password [email or web address] [facility]  Office  [phone]  CC: Primary care physician; Crecencio Mc, MD  Patient had emergent upper endoscopy Bleeding duodenal ulcer noted Patient will be transferred to step down unit for close monitoring  Note: This dictation was prepared with Dragon dictation along with smaller phrase technology. Any transcriptional errors that result from this process are unintentional.

## 2018-01-24 NOTE — Progress Notes (Signed)
1020 Report called to [facility] on 1 C. Family aware and positive about move to 1 C.

## 2018-01-24 NOTE — Progress Notes (Signed)
   01/24/18 0325  Clinical Encounter Type  Visited With Patient and family together  Visit Type Initial  Referral From Nurse  Recommendations continue to follow  Spiritual Encounters  Spiritual Needs Emotional   Chaplain responded to page to support patient's family.  Chaplain engaged patient's daughter [name] and her spouse Mikki Santee.  Chaplain moved back and forth between patient's room and room where patient's son-in-law waited as did patient's daughter.  Chaplain offered a calming presence, active and reflective listening; Tootie engaged in life review of patient and her role as caregiver for patient.  Conversation around supports available to patient's family.  Chaplain to follow up with family again, also encouraged them to reach out as needed.  Chaplain checked in with patient's nurse before leaving unit.

## 2018-01-24 NOTE — Progress Notes (Addendum)
GI progress note  Events from last 12 hours noted. Rebleeding occurred around 2 am this morning. Tootie, patient's daughter, arrived around 2:30am. Patient now very lethargic and hypotensive, receiving Iv morphine prn for pain. Comfort care measures have been employed by the family.  It is regretful this nice lady's status has declined. I remain available for further support  if the decision is changed about repeat endoscopy.   I spoke at length about the case with the patient's daughter, [name], and her husband.    Olean Ree, "Lanny Hurst MD [facility] [place], [place] [address] [phone]  01/24/2018  2:08pm

## 2018-01-24 NOTE — Progress Notes (Signed)
Pt actively bleeding per rectum and hypotensive with sbp 70's she is currently receiving 2 units pRBC's, will receive a total of 3 units pRBC's and 2 units of FFP.  Notified pts daughter via telephone regarding decline in pts condition she stated she will arrive at bedside shortly.  Will continue to monitor and assess pt.  Marda Stalker, [place] Pager [phone] (please enter 7 digits) PCCM Consult Pager [phone] (please enter 7 digits)

## 2018-01-25 ENCOUNTER — Encounter: Payer: Self-pay | Admitting: Internal Medicine

## 2018-01-25 LAB — TYPE AND SCREEN
ABO/RH(D): A NEG
ANTIBODY SCREEN: NEGATIVE
Unit division: 0
Unit division: 0
Unit division: 0
Unit division: 0
Unit division: 0
Weak D: POSITIVE

## 2018-01-25 LAB — BPAM RBC
BLOOD PRODUCT EXPIRATION DATE: 201912232359
Blood Product Expiration Date: 201912112359
Blood Product Expiration Date: 201912192359
Blood Product Expiration Date: 201912232359
Blood Product Expiration Date: 201912242359
ISSUE DATE / TIME: 201912030152
ISSUE DATE / TIME: 201912030231
ISSUE DATE / TIME: 201912030255
Unit Type and Rh: 600
Unit Type and Rh: 600
Unit Type and Rh: 600
Unit Type and Rh: 600
Unit Type and Rh: 600

## 2018-01-25 MED ORDER — ORAL CARE MOUTH RINSE
15.0000 mL | Freq: Two times a day (BID) | OROMUCOSAL | Status: DC
  Administered 2018-01-25: 10:00:00 15 mL via OROMUCOSAL

## 2018-01-25 NOTE — Progress Notes (Signed)
Pt has been discharged to hospice home. Transported by EMS. 

## 2018-01-25 NOTE — Progress Notes (Signed)
Visit made. Patient seen lying in bed, daughter at bedside. Patient appears pale with shallow respirations. Writer discussed transfer to the hospice home with her daughter [name], she is in agreement. Patient appears to be stable for transport. Hospital care team updated. Notes faxed to referral. Report called to the hospice home, EMS notified for transport. Signed DNR in place in discharge packet.  Flo Shanks RN, BSN, [facility] [place], hospital liaison [phone]

## 2018-01-25 NOTE — Discharge Summary (Addendum)
[facility] at [place] NAME: Cindy Harvey    MR#:  [id number]  DATE OF BIRTH:  [birth year]  DATE OF ADMISSION:  01/21/2018 ADMITTING PHYSICIAN: Harrie Foreman, MD  DATE OF DISCHARGE: 01/26/2108  PRIMARY CARE PHYSICIAN: Crecencio Mc, MD   ADMISSION DIAGNOSIS:  Hemorrhagic shock ([place]) [R57.8] Gastrointestinal hemorrhage, unspecified gastrointestinal hemorrhage type [K92.2] Acute renal failure Acute hyperkalemia Advanced dementia DISCHARGE DIAGNOSIS:  Hemorrhagic shock Acute gastrointestinal bleeding Duodenal ulcer Renal failure Acute cardiorespiratory failure Hyperkalemia Advanced dementia Hypoxia SECONDARY DIAGNOSIS:   Past Medical History:  Diagnosis Date  . Allergy    allergic rhinitis  . Hearing loss   . History of shingles   . Hypercholesterolemia   . Hypertension   . Macular degeneration   . Osteoarthritis    right knee  . Pneumonia 2009   with dehydration and electrolyte imbalance  . Urinary incontinence      ADMITTING HISTORY The patient with past medical history of hypertension and hearing loss presents to the emergency department from her nursing home with hematemesis.  The patient's daughter states that her mother was in her usual state of health approximately 10 PM but by 5 AM reportedly had multiple episodes of coffee-ground emesis.  The patient denies pain.  Hemoglobin was found to have dropped by more than 1 g.  The patient also became hypotensive with systolic pressures between 85 and 90.  The patient received IV pantoprazole and 2 units of packed red blood cells were ordered for transfusion prior to the emergency department staff calling the hospitalist service for admission.   HOSPITAL COURSE:  Patient was admitted to ICU for hypotension, hemorrhagic shock and acute GI bleed.  Patient received PRBC transfusions intravenously.  Patient is DNR by by [place].  She received PRBC transfusion and aggressive  IV fluids.  Initially endoscopy and colonoscopy were deferred considering advanced age and the complications with anesthesia.  Patient was transferred to medical floor.  She again had massive hematemesis became hypotensive lethargic and was transferred back to ICU.  Emergent endoscopy was done a large bleeding duodenal ulcer was noted.  Patient was stabilized again in the ICU but had a repeat episode of massive hematemesis.  Family did not want any aggressive measures such as EGD and any other procedures along with PRBC transfusions and IV fluids.  Considering advanced age and multiple comorbidities they considered comfort care only.  Patient was transitioned to comfort care and transferred to medical floor.  Patient has a bed at [facility].  She will be discharged today.  Family updated.  CONSULTS OBTAINED:  Treatment Team:  Efrain Sella, MD  DRUG ALLERGIES:   Allergies  Allergen Reactions  . Asa Buff (Mag [Buffered Aspirin] Other (See Comments)    Nose bleeds  . Lipitor [Atorvastatin Calcium] Other (See Comments)    Headaches & dizzy  . Seroquel [Quetiapine Fumarate]   . Zocor [Simvastatin - High Dose] Other (See Comments)    myalgia    DISCHARGE MEDICATIONS:   Allergies as of 01/25/2018      Reactions   Asa Buff (mag [buffered Aspirin] Other (See Comments)   Nose bleeds   Lipitor [atorvastatin Calcium] Other (See Comments)   Headaches & dizzy   Seroquel [quetiapine Fumarate]    Zocor [simvastatin - High Dose] Other (See Comments)   myalgia      Medication List    STOP taking these medications   acetaminophen 160 MG/5ML  liquid Commonly known as:  TYLENOL   acetaminophen 160 MG/5ML suspension Commonly known as:  TYLENOL   alum & mag hydroxide-simeth 200-200-20 MG/5ML suspension Commonly known as:  MAALOX/MYLANTA   amLODipine 5 MG tablet Commonly known as:  NORVASC   EQ MULTIVITAMINS ADULT GUMMY Chew   furosemide 20 MG tablet Commonly known  as:  LASIX   guaiFENesin 100 MG/5ML liquid Commonly known as:  ROBITUSSIN   ipratropium-albuterol 0.5-2.5 (3) MG/3ML Soln Commonly known as:  DUONEB   loperamide 2 MG tablet Commonly known as:  IMODIUM A-D   LORazepam 0.5 MG tablet Commonly known as:  ATIVAN   meloxicam 7.5 MG tablet Commonly known as:  MOBIC   ondansetron 4 MG tablet Commonly known as:  ZOFRAN   promethazine 12.5 MG tablet Commonly known as:  PHENERGAN   QUEtiapine 25 MG tablet Commonly known as:  SEROQUEL     Current medications patient in the hospital Glycopyrrolate PRN IV Ativan injections for agitation PRN IV morphine  Today  Patient seen today Unresponsive On comfort measures VITAL SIGNS:  Blood pressure (!) 122/39, pulse (!) 113, temperature 98 F (36.7 C), temperature source Oral, resp. rate 18, height 5\' 1"  (1.549 m), weight 48.9 kg, SpO2 96 %.  I/O:  No intake or output data in the 24 hours ending 01/25/18 1134  PHYSICAL EXAMINATION:  Physical Exam  GENERAL:  84 y.o.-year-old patient lying in the bed  LUNGS: Normal breath sounds bilaterally, no wheezing, rales,rhonchi or crepitation. No use of accessory muscles of respiration.  CARDIOVASCULAR: S1, S2 normal. No murmurs, rubs, or gallops.  ABDOMEN: Soft, non-tender, non-distended. Bowel sounds present. No organomegaly or mass.  NEUROLOGIC: Unresponsive PSYCHIATRIC: The patient is alert and oriented x none SKIN: No obvious rash, lesion, or ulcer.   DATA REVIEW:   CBC Recent Labs  Lab 01/22/18 0451  01/23/18 2221  WBC 9.9  --   --   HGB 7.9*   < > 4.8*  HCT 24.5*   < > 16.0*  PLT 349  --   --    < > = values in this interval not displayed.    Chemistries  Recent Labs  Lab 01/21/18 0606  01/21/18 1316 01/22/18 0108  NA 133*  --  135  --   K 5.3*  --  5.4*  --   CL 100  --  106  --   CO2 22  --  22  --   GLUCOSE 113*  --  98  --   BUN 50*  --  57*  --   CREATININE 4.26*  --  3.90*  --   CALCIUM 8.2*  --  7.7*  --    MG  --    < > 2.8* 2.7*  AST 20  --   --   --   ALT 12  --   --   --   ALKPHOS 148*  --   --   --   BILITOT 0.4  --   --   --    < > = values in this interval not displayed.    Cardiac Enzymes No results for input(s): TROPONINI in the last 168 hours.  Microbiology Results  Results for orders placed or performed during the hospital encounter of 01/07/18  MRSA PCR Screening     Status: Abnormal   Collection Time: 01/08/18  2:06 AM  Result Value Ref Range Status   MRSA by PCR POSITIVE (A) NEGATIVE Final    Comment:  The GeneXpert MRSA Assay (FDA approved for NASAL specimens only), is one component of a comprehensive MRSA colonization surveillance program. It is not intended to diagnose MRSA infection nor to guide or monitor treatment for MRSA infections. RESULT CALLED TO, READ BACK BY AND VERIFIED WITH: YAKANA CROSS AT 0422 ON 01/08/18 BY [facility] Performed at [facility], [address] [place], [place] [address]     RADIOLOGY:  Dg Chest Port 1 View  Result Date: 01/23/2018 CLINICAL DATA:  Aspiration pneumonia. EXAM: PORTABLE CHEST 1 VIEW COMPARISON:  02/23/2016 FINDINGS: The heart is upper limits of normal in size given the AP projection portable technique. There is moderate tortuosity and calcification of the thoracic aorta. Stable emphysematous changes and bibasilar pulmonary scarring. No definite infiltrates or effusions. Skin fold noted over the left hemithorax. The bony thorax is intact. IMPRESSION: Chronic emphysematous changes and pulmonary scarring. No definite acute overlying pulmonary process. Electronically Signed   By: Marijo Sanes M.D.   On: 01/23/2018 15:27    Follow up with PCP in 1 week.  Management plans discussed with the patient, family and they are in agreement.  CODE STATUS: DNR    Code Status Orders  (From admission, onward)         Start     Ordered   01/24/18 0330  Do not attempt resuscitation (DNR)  Continuous    Question  Answer Comment  In the event of cardiac or respiratory ARREST Do not call a "code blue"   In the event of cardiac or respiratory ARREST Do not perform Intubation, CPR, defibrillation or ACLS   In the event of cardiac or respiratory ARREST Use medication by any route, position, wound care, and other measures to relive pain and suffering. Bruins use oxygen, suction and manual treatment of airway obstruction as needed for comfort.      01/24/18 0329        Code Status History    Date Active Date Inactive Code Status Order ID Comments User Context   01/21/2018 0910 01/24/2018 0329 DNR [id number]  Harrie Foreman, MD ED   01/08/2018 0109 01/09/2018 1922 DNR [id number]  Lance Coon, MD Inpatient   02/24/2016 0200 02/26/2016 1952 DNR [id number]  Lance Coon, MD Inpatient    Advance Directive Documentation     Most Recent Value  Type of Advance Directive  Out of facility DNR (pink MOST or yellow form)  Pre-existing out of facility DNR order (yellow form or pink MOST form)  Pink MOST form placed in chart (order not valid for inpatient use)  "MOST" Form in Place?  -      TOTAL TIME TAKING CARE OF THIS PATIENT ON DAY OF DISCHARGE: more than 35 minutes.   [name] M.D on 01/25/2018 at 11:34 AM  Between 7am to 6pm - Pager - [phone]  After 6pm go to [email or web address] - password [email or web address] [facility]  Office  [phone]  CC: Primary care physician; Crecencio Mc, MD  Note: This dictation was prepared with Dragon dictation along with smaller phrase technology. Any transcriptional errors that result from this process are unintentional.

## 2018-01-25 NOTE — Clinical Social Work Note (Signed)
Patient is comfort care and family is interested in taking patient to [facility] home. Santiago Glad, hospice liaison is aware.   [place], [place]

## 2018-01-25 NOTE — Plan of Care (Signed)

## 2018-01-25 NOTE — Clinical Social Work Note (Signed)
Clinical Social Work Assessment  Patient Details  Name: Cindy Harvey MRN: [id number] Date of Birth: 07-Nov-1916  Date of referral:  01/25/18               Reason for consult:  Facility Placement                Permission sought to share information with:  Case Manager, Customer service manager, Family Supports Permission granted to share information::  Yes, Verbal Permission Granted  Name::        Agency::     Relationship::     Contact Information:     Housing/Transportation Living arrangements for the past 2 months:  [facility] of Information:  Adult Children Patient Interpreter Needed:  None Criminal Activity/Legal Involvement Pertinent to Current Situation/Hospitalization:  No - Comment as needed Significant Relationships:  Adult Children Lives with:  Facility Resident Do you feel safe going back to the place where you live?  Yes Need for family participation in patient care:  Yes (Comment)  Care giving concerns:  Patient is a long term resident at [facility]    Social Worker assessment / plan:  CSW consulted for facility placement. CSW met with patient and daughter Cindy Harvey (Tootie) at bedside. Patient is asleep during interview and daughter states that patient has been sleeping a lot the past couple days. Daughter reports that patient lives at [facility] but since patient has declined she does not feel that [facility] can manage her care at this point. Daughter reports that patient is comfort care and has been a patient of [facility] for a while. CSW will follow up with hospice liaison to see if patient is appropriate for hospice facility. CSW will follow for transition of care.   Employment status:  Retired Forensic scientist:  Other (Comment Required)(Hospice ) PT Recommendations:  Not assessed at this time Information / Referral to community resources:     Patient/Family's Response to care:  Daughter thanked  CSW for assistance   Patient/Family's Understanding of and Emotional Response to Diagnosis, Current Treatment, and Prognosis:  Daughter understands that patient is end of life and wants to continue with comfort care   Emotional Assessment Appearance:  Appears stated age Attitude/Demeanor/Rapport:  Unable to Assess Affect (typically observed):  Unable to Assess Orientation:    Alcohol / Substance use:  Not Applicable Psych involvement (Current and /or in the community):  No (Comment)  Discharge Needs  Concerns to be addressed:  Discharge Planning Concerns Readmission within the last 30 days:  Yes Current discharge risk:  None Barriers to Discharge:  Continued Medical Work up   [employer], [place] 01/25/2018, 8:51 AM

## 2018-01-26 LAB — PREPARE RBC (CROSSMATCH)

## 2018-02-22 DEATH — deceased
